# Patient Record
Sex: Female | Born: 1940
Health system: Southern US, Community
[De-identification: ages and names within clinical notes are randomized; demographics above are authoritative.]

## PROBLEM LIST (undated history)

## (undated) DIAGNOSIS — H409 Unspecified glaucoma: Secondary | ICD-10-CM

## (undated) DIAGNOSIS — D649 Anemia, unspecified: Secondary | ICD-10-CM

## (undated) DIAGNOSIS — K219 Gastro-esophageal reflux disease without esophagitis: Secondary | ICD-10-CM

## (undated) DIAGNOSIS — E785 Hyperlipidemia, unspecified: Secondary | ICD-10-CM

## (undated) DIAGNOSIS — K635 Polyp of colon: Secondary | ICD-10-CM

## (undated) DIAGNOSIS — I10 Essential (primary) hypertension: Secondary | ICD-10-CM

## (undated) DIAGNOSIS — K5792 Diverticulitis of intestine, part unspecified, without perforation or abscess without bleeding: Secondary | ICD-10-CM

## (undated) DIAGNOSIS — K5730 Diverticulosis of large intestine without perforation or abscess without bleeding: Secondary | ICD-10-CM

## (undated) DIAGNOSIS — E079 Disorder of thyroid, unspecified: Secondary | ICD-10-CM

## (undated) HISTORY — DX: Diverticulitis of intestine, part unspecified, without perforation or abscess without bleeding: K57.92

## (undated) HISTORY — DX: Polyp of colon: K63.5

## (undated) HISTORY — DX: Unspecified glaucoma: H40.9

## (undated) HISTORY — DX: Disorder of thyroid, unspecified: E07.9

## (undated) HISTORY — PX: CHOLECYSTECTOMY: SHX55

## (undated) HISTORY — DX: Diverticulosis of large intestine without perforation or abscess without bleeding: K57.30

## (undated) HISTORY — PX: TUBAL LIGATION: SHX77

## (undated) HISTORY — DX: Essential (primary) hypertension: I10

## (undated) HISTORY — DX: Anemia, unspecified: D64.9

## (undated) HISTORY — DX: Hyperlipidemia, unspecified: E78.5

## (undated) HISTORY — DX: Gastro-esophageal reflux disease without esophagitis: K21.9

---

## 2003-06-20 ENCOUNTER — Other Ambulatory Visit: Admission: RE | Admit: 2003-06-20 | Discharge: 2003-06-20 | Payer: Self-pay | Admitting: Internal Medicine

## 2004-04-12 ENCOUNTER — Ambulatory Visit (HOSPITAL_COMMUNITY): Admission: RE | Admit: 2004-04-12 | Discharge: 2004-04-12 | Payer: Self-pay | Admitting: Internal Medicine

## 2005-04-18 ENCOUNTER — Ambulatory Visit (HOSPITAL_COMMUNITY): Admission: RE | Admit: 2005-04-18 | Discharge: 2005-04-18 | Payer: Self-pay | Admitting: Internal Medicine

## 2005-05-29 ENCOUNTER — Encounter: Admission: RE | Admit: 2005-05-29 | Discharge: 2005-05-29 | Payer: Self-pay | Admitting: Internal Medicine

## 2006-05-14 ENCOUNTER — Other Ambulatory Visit: Admission: RE | Admit: 2006-05-14 | Discharge: 2006-05-14 | Payer: Self-pay | Admitting: *Deleted

## 2007-02-16 DIAGNOSIS — Z86010 Personal history of colonic polyps: Secondary | ICD-10-CM | POA: Insufficient documentation

## 2007-02-18 DIAGNOSIS — B9681 Helicobacter pylori [H. pylori] as the cause of diseases classified elsewhere: Secondary | ICD-10-CM

## 2007-02-18 HISTORY — DX: Helicobacter pylori (H. pylori) as the cause of diseases classified elsewhere: B96.81

## 2007-03-17 ENCOUNTER — Encounter: Admission: RE | Admit: 2007-03-17 | Discharge: 2007-03-17 | Payer: Self-pay | Admitting: Family Medicine

## 2010-06-22 ENCOUNTER — Other Ambulatory Visit (HOSPITAL_COMMUNITY)
Admission: RE | Admit: 2010-06-22 | Discharge: 2010-06-22 | Disposition: A | Discharge status: Home / Self Care | Payer: Medicare PPO | Source: Ambulatory Visit | Attending: Internal Medicine | Admitting: Internal Medicine

## 2010-06-22 DIAGNOSIS — Z124 Encounter for screening for malignant neoplasm of cervix: Secondary | ICD-10-CM | POA: Insufficient documentation

## 2010-06-25 ENCOUNTER — Other Ambulatory Visit (HOSPITAL_COMMUNITY): Payer: Self-pay | Admitting: Internal Medicine

## 2010-06-25 DIAGNOSIS — Z1231 Encounter for screening mammogram for malignant neoplasm of breast: Secondary | ICD-10-CM

## 2010-07-03 ENCOUNTER — Ambulatory Visit (HOSPITAL_COMMUNITY): Payer: Medicare PPO

## 2010-07-10 ENCOUNTER — Ambulatory Visit (HOSPITAL_COMMUNITY)
Admission: RE | Admit: 2010-07-10 | Discharge: 2010-07-10 | Disposition: A | Discharge status: Home / Self Care | Payer: Medicare PPO | Source: Ambulatory Visit | Attending: Internal Medicine | Admitting: Internal Medicine

## 2010-07-10 DIAGNOSIS — Z1231 Encounter for screening mammogram for malignant neoplasm of breast: Secondary | ICD-10-CM | POA: Insufficient documentation

## 2011-07-02 ENCOUNTER — Other Ambulatory Visit (HOSPITAL_COMMUNITY): Payer: Self-pay | Admitting: Internal Medicine

## 2011-07-02 DIAGNOSIS — E039 Hypothyroidism, unspecified: Secondary | ICD-10-CM

## 2011-07-09 ENCOUNTER — Ambulatory Visit (HOSPITAL_COMMUNITY)
Admission: RE | Admit: 2011-07-09 | Discharge: 2011-07-09 | Disposition: A | Discharge status: Home / Self Care | Payer: Medicare PPO | Source: Ambulatory Visit | Attending: Internal Medicine | Admitting: Internal Medicine

## 2011-07-09 DIAGNOSIS — E039 Hypothyroidism, unspecified: Secondary | ICD-10-CM | POA: Insufficient documentation

## 2011-07-09 DIAGNOSIS — Z13820 Encounter for screening for osteoporosis: Secondary | ICD-10-CM | POA: Insufficient documentation

## 2011-07-09 DIAGNOSIS — Z780 Asymptomatic menopausal state: Secondary | ICD-10-CM | POA: Insufficient documentation

## 2011-09-18 ENCOUNTER — Other Ambulatory Visit (HOSPITAL_COMMUNITY): Payer: Self-pay | Admitting: Physician Assistant

## 2011-09-18 DIAGNOSIS — Z1231 Encounter for screening mammogram for malignant neoplasm of breast: Secondary | ICD-10-CM

## 2011-10-01 ENCOUNTER — Ambulatory Visit (HOSPITAL_COMMUNITY)
Admission: RE | Admit: 2011-10-01 | Discharge: 2011-10-01 | Disposition: A | Discharge status: Home / Self Care | Payer: Medicare PPO | Source: Ambulatory Visit | Attending: Physician Assistant | Admitting: Physician Assistant

## 2011-10-01 DIAGNOSIS — Z1231 Encounter for screening mammogram for malignant neoplasm of breast: Secondary | ICD-10-CM

## 2011-10-01 LAB — HM MAMMOGRAPHY: HM Mammogram: NORMAL

## 2012-09-23 LAB — HM COLONOSCOPY

## 2012-12-14 ENCOUNTER — Telehealth: Payer: Self-pay | Admitting: *Deleted

## 2012-12-14 NOTE — Telephone Encounter (Signed)
Please tell her to switch to allegra, let her know she can hold her nose while drinking water. Would prefer not to give a nasal spray with glacoma history.

## 2012-12-14 NOTE — Telephone Encounter (Signed)
Patient aware of medical advice per Marchelle Folks

## 2012-12-14 NOTE — Telephone Encounter (Signed)
Pt is calling with clogged ear,says it could be due to sinus but wasn't sure what to try OTC to help this . Did start on clartin this pm but is wondering if u have anymore suggestions? Please advise*

## 2012-12-21 ENCOUNTER — Encounter: Payer: Self-pay | Admitting: Internal Medicine

## 2012-12-21 DIAGNOSIS — E785 Hyperlipidemia, unspecified: Secondary | ICD-10-CM | POA: Insufficient documentation

## 2012-12-21 DIAGNOSIS — I10 Essential (primary) hypertension: Secondary | ICD-10-CM | POA: Insufficient documentation

## 2012-12-21 DIAGNOSIS — K219 Gastro-esophageal reflux disease without esophagitis: Secondary | ICD-10-CM | POA: Insufficient documentation

## 2012-12-22 ENCOUNTER — Encounter: Payer: Self-pay | Admitting: Physician Assistant

## 2012-12-22 ENCOUNTER — Ambulatory Visit (INDEPENDENT_AMBULATORY_CARE_PROVIDER_SITE_OTHER): Payer: Medicare PPO | Admitting: Physician Assistant

## 2012-12-22 VITALS — BP 128/68 | HR 72 | Temp 97.7°F | Resp 16 | Ht 64.5 in | Wt 144.0 lb

## 2012-12-22 DIAGNOSIS — J069 Acute upper respiratory infection, unspecified: Secondary | ICD-10-CM

## 2012-12-22 MED ORDER — PREDNISONE 20 MG PO TABS: ORAL_TABLET | ORAL | Status: DC

## 2012-12-22 MED ORDER — AMOXICILLIN-POT CLAVULANATE 875-125 MG PO TABS: 1.0000 | ORAL_TABLET | Freq: Two times a day (BID) | ORAL | Status: DC

## 2012-12-22 NOTE — Progress Notes (Signed)
   Subjective:    Patient ID: Robin Zimmerman, female    DOB: [birth year], 72 y.o.   MRN: [id number]  Sinus Problem This is a new problem. The current episode started 1 to 4 weeks ago. The problem has been gradually worsening since onset. There has been no fever. She is experiencing no pain. Associated symptoms include congestion, coughing, ear pain, headaches, a hoarse voice and sinus pressure. Pertinent negatives include no chills, diaphoresis, neck pain, shortness of breath, sneezing, sore throat or swollen glands. Past treatments include nothing.     Current Outpatient Prescriptions on File Prior to Visit  Medication Sig Dispense Refill  . Cholecalciferol (VITAMIN D PO) Take 5,000 Int'l Units by mouth daily.      . IRON PO Take by mouth daily.      Marland Kitchen levobunolol (BETAGAN) 0.5 % ophthalmic solution at bedtime.      . Travoprost, BAK Free, (TRAVATAN Z) 0.004 % SOLN ophthalmic solution 1 drop at bedtime.       No current facility-administered medications on file prior to visit.   Past Medical History  Diagnosis Date  . Anemia   . GERD (gastroesophageal reflux disease)   . Hyperlipidemia   . Hypertension   . Thyroid disease    Review of Systems  Constitutional: Negative for chills and diaphoresis.  HENT: Positive for congestion, ear pain, hoarse voice and sinus pressure. Negative for sneezing and sore throat.   Respiratory: Positive for cough. Negative for shortness of breath.   Musculoskeletal: Negative for neck pain.  Neurological: Positive for headaches.       Objective:   Physical Exam  Constitutional: She appears well-developed and well-nourished.  HENT:  Head: Normocephalic and atraumatic.  Right Ear: External ear normal. Tympanic membrane is bulging. A middle ear effusion is present.  Left Ear: Tympanic membrane and external ear normal.  Nose: Right sinus exhibits frontal sinus tenderness. Left sinus exhibits frontal sinus tenderness.  Mouth/Throat: Oropharynx is  clear and moist.  Eyes: Conjunctivae and EOM are normal.  Neck: Normal range of motion. Neck supple.  Cardiovascular: Normal rate, regular rhythm, normal heart sounds and intact distal pulses.   Pulmonary/Chest: Effort normal and breath sounds normal. No respiratory distress. She has no wheezes.  Abdominal: Soft. Bowel sounds are normal.  Lymphadenopathy:    She has no cervical adenopathy.  Skin: Skin is warm and dry.      Assessment & Plan:  Upper respiratory infection - Plan: amoxicillin-clavulanate (AUGMENTIN) 875-125 MG per tablet, predniSONE (DELTASONE) 20 MG tablet

## 2012-12-22 NOTE — Patient Instructions (Signed)
Please get on the augmentin for at least 7 days as directed Please do claritin or zyrtec over the counter with a decongestant.  Serous Otitis Media  Serous otitis media is fluid in the middle ear space. This space contains the bones for hearing and air. Air in the middle ear space helps to transmit sound.  The air gets there through the eustachian tube. This tube goes from the back of the nose (nasopharynx) to the middle ear space. It keeps the pressure in the middle ear the same as the outside world. It also helps to drain fluid from the middle ear space. CAUSES  Serous otitis media occurs when the eustachian tube gets blocked. Blockage can come from:  Ear infections.  Colds and other upper respiratory infections.  Allergies.  Irritants such as cigarette smoke.  Sudden changes in air pressure (such as descending in an airplane).  Enlarged adenoids.  A mass in the nasopharynx. During colds and upper respiratory infections, the middle ear space can become temporarily filled with fluid. This can happen after an ear infection also. Once the infection clears, the fluid will generally drain out of the ear through the eustachian tube. If it does not, then serous otitis media occurs. SIGNS AND SYMPTOMS   Hearing loss.  A feeling of fullness in the ear, without pain.  Young children may not show any symptoms but may show slight behavioral changes, such as agitation, ear pulling, or crying. DIAGNOSIS  Serous otitis media is diagnosed by an ear exam. Tests may be done to check on the movement of the eardrum. Hearing exams may also be done. TREATMENT  The fluid most often goes away without treatment. If allergy is the cause, allergy treatment may be helpful. Fluid that persists for several months may require minor surgery. A small tube is placed in the eardrum to:  Drain the fluid.  Restore the air in the middle ear space. In certain situations, antibiotics are used to avoid surgery.  Surgery may be done to remove enlarged adenoids (if this is the cause). HOME CARE INSTRUCTIONS   Keep children away from tobacco smoke.  Be sure to keep any follow-up appointments. SEEK MEDICAL CARE IF:   Your hearing is not better in 3 months.  Your hearing is worse.  You have ear pain.  You have drainage from the ear.  You have dizziness.  You have serous otitis media only in one ear or have any bleeding from your nose (epistaxis).  You notice a lump on your neck. MAKE SURE YOU:  Understand these instructions.   Will watch your condition.   Will get help right away if you are not doing well or get worse.  Document Released: 03/23/2003 Document Revised: 09/02/2012 Document Reviewed: 07/28/2012 [facility] Patient Information 2014 [place], [place].

## 2013-01-19 ENCOUNTER — Encounter: Payer: Self-pay | Admitting: Physician Assistant

## 2013-01-19 ENCOUNTER — Ambulatory Visit (INDEPENDENT_AMBULATORY_CARE_PROVIDER_SITE_OTHER): Payer: Medicare PPO | Admitting: Physician Assistant

## 2013-01-19 VITALS — BP 118/72 | HR 76 | Temp 97.9°F | Resp 16 | Ht 64.5 in | Wt 143.0 lb

## 2013-01-19 DIAGNOSIS — E785 Hyperlipidemia, unspecified: Secondary | ICD-10-CM

## 2013-01-19 DIAGNOSIS — R7303 Prediabetes: Secondary | ICD-10-CM

## 2013-01-19 DIAGNOSIS — E559 Vitamin D deficiency, unspecified: Secondary | ICD-10-CM

## 2013-01-19 DIAGNOSIS — Z79899 Other long term (current) drug therapy: Secondary | ICD-10-CM

## 2013-01-19 DIAGNOSIS — E079 Disorder of thyroid, unspecified: Secondary | ICD-10-CM

## 2013-01-19 DIAGNOSIS — R7309 Other abnormal glucose: Secondary | ICD-10-CM | POA: Insufficient documentation

## 2013-01-19 DIAGNOSIS — I10 Essential (primary) hypertension: Secondary | ICD-10-CM

## 2013-01-19 LAB — CBC WITH DIFFERENTIAL/PLATELET
Basophils Absolute: 0 10*3/uL (ref 0.0–0.1)
Basophils Relative: 0 % (ref 0–1)
EOS ABS: 0.1 10*3/uL (ref 0.0–0.7)
EOS PCT: 2 % (ref 0–5)
HCT: 35.8 % — ABNORMAL LOW (ref 36.0–46.0)
Hemoglobin: 11.8 g/dL — ABNORMAL LOW (ref 12.0–15.0)
Lymphocytes Relative: 37 % (ref 12–46)
Lymphs Abs: 1.2 10*3/uL (ref 0.7–4.0)
MCH: 29 pg (ref 26.0–34.0)
MCHC: 33 g/dL (ref 30.0–36.0)
MCV: 88 fL (ref 78.0–100.0)
MONO ABS: 0.2 10*3/uL (ref 0.1–1.0)
MONOS PCT: 6 % (ref 3–12)
NEUTROS ABS: 1.8 10*3/uL (ref 1.7–7.7)
Neutrophils Relative %: 55 % (ref 43–77)
Platelets: 265 10*3/uL (ref 150–400)
RBC: 4.07 MIL/uL (ref 3.87–5.11)
RDW: 13.4 % (ref 11.5–15.5)
WBC: 3.3 10*3/uL — ABNORMAL LOW (ref 4.0–10.5)

## 2013-01-19 LAB — BASIC METABOLIC PANEL WITH GFR
BUN: 11 mg/dL (ref 6–23)
CALCIUM: 9.1 mg/dL (ref 8.4–10.5)
CO2: 34 meq/L — AB (ref 19–32)
CREATININE: 1.02 mg/dL (ref 0.50–1.10)
Chloride: 101 mEq/L (ref 96–112)
GFR, EST AFRICAN AMERICAN: 64 mL/min
GFR, EST NON AFRICAN AMERICAN: 55 mL/min — AB
GLUCOSE: 73 mg/dL (ref 70–99)
Potassium: 4 mEq/L (ref 3.5–5.3)
Sodium: 142 mEq/L (ref 135–145)

## 2013-01-19 LAB — HEMOGLOBIN A1C
HEMOGLOBIN A1C: 6 % — AB (ref <5.7)
MEAN PLASMA GLUCOSE: 126 mg/dL — AB (ref <117)

## 2013-01-19 LAB — HEPATIC FUNCTION PANEL
ALBUMIN: 4.2 g/dL (ref 3.5–5.2)
ALK PHOS: 65 U/L (ref 39–117)
ALT: 9 U/L (ref 0–35)
AST: 16 U/L (ref 0–37)
BILIRUBIN DIRECT: 0.1 mg/dL (ref 0.0–0.3)
Indirect Bilirubin: 0.4 mg/dL (ref 0.0–0.9)
Total Bilirubin: 0.5 mg/dL (ref 0.3–1.2)
Total Protein: 7.1 g/dL (ref 6.0–8.3)

## 2013-01-19 LAB — LIPID PANEL
Cholesterol: 204 mg/dL — ABNORMAL HIGH (ref 0–200)
HDL: 74 mg/dL (ref >39)
LDL CALC: 109 mg/dL — AB (ref 0–99)
TRIGLYCERIDES: 103 mg/dL (ref <150)
Total CHOL/HDL Ratio: 2.8 Ratio
VLDL: 21 mg/dL (ref 0–40)

## 2013-01-19 LAB — TSH: TSH: 0.437 u[IU]/mL (ref 0.350–4.500)

## 2013-01-19 LAB — MAGNESIUM: Magnesium: 2.1 mg/dL (ref 1.5–2.5)

## 2013-01-19 NOTE — Patient Instructions (Addendum)
Bad carbs also include fruit juice, alcohol, and sweet tea. These are empty calories that do not signal to your brain that you are full.   Please remember the good carbs are still carbs which convert into sugar. So please measure them out no more than 1/2-1 cup of rice, oatmeal, pasta, and beans.  Veggies are however free foods! Pile them on.   I like lean protein at every meal such as chicken, [place], pork chops, cottage cheese, etc. Just do not fry these meats and please center your meal around vegetable, the meats should be a side dish.   No all fruit is created equal. Please see the list below, the fruit at the bottom is higher in sugars than the fruit at the top   Serous Otitis Media  Serous otitis media is fluid in the middle ear space. This space contains the bones for hearing and air. Air in the middle ear space helps to transmit sound.  The air gets there through the eustachian tube. This tube goes from the back of the nose (nasopharynx) to the middle ear space. It keeps the pressure in the middle ear the same as the outside world. It also helps to drain fluid from the middle ear space. CAUSES  Serous otitis media occurs when the eustachian tube gets blocked. Blockage can come from:  Ear infections.  Colds and other upper respiratory infections.  Allergies.  Irritants such as cigarette smoke.  Sudden changes in air pressure (such as descending in an airplane).  Enlarged adenoids.  A mass in the nasopharynx. During colds and upper respiratory infections, the middle ear space can become temporarily filled with fluid. This can happen after an ear infection also. Once the infection clears, the fluid will generally drain out of the ear through the eustachian tube. If it does not, then serous otitis media occurs. SIGNS AND SYMPTOMS   Hearing loss.  A feeling of fullness in the ear, without pain.  Young children may not show any symptoms but may show slight behavioral  changes, such as agitation, ear pulling, or crying. DIAGNOSIS  Serous otitis media is diagnosed by an ear exam. Tests may be done to check on the movement of the eardrum. Hearing exams may also be done. TREATMENT  The fluid most often goes away without treatment. If allergy is the cause, allergy treatment may be helpful. Fluid that persists for several months may require minor surgery. A small tube is placed in the eardrum to:  Drain the fluid.  Restore the air in the middle ear space. In certain situations, antibiotics are used to avoid surgery. Surgery may be done to remove enlarged adenoids (if this is the cause). HOME CARE INSTRUCTIONS   Keep children away from tobacco smoke.  Be sure to keep any follow-up appointments. SEEK MEDICAL CARE IF:   Your hearing is not better in 3 months.  Your hearing is worse.  You have ear pain.  You have drainage from the ear.  You have dizziness.  You have serous otitis media only in one ear or have any bleeding from your nose (epistaxis).  You notice a lump on your neck. MAKE SURE YOU:  Understand these instructions.   Will watch your condition.   Will get help right away if you are not doing well or get worse.  Document Released: 03/23/2003 Document Revised: 09/02/2012 Document Reviewed: 07/28/2012 [facility] Patient Information 2014 [place], [place]. Constipation, Adult Constipation is when a person has fewer than 3 bowel movements  a week; has difficulty having a bowel movement; or has stools that are dry, hard, or larger than normal. As people grow older, constipation is more common. If you try to fix constipation with medicines that make you have a bowel movement (laxatives), the problem may get worse. Long-term laxative use may cause the muscles of the colon to become weak. A low-fiber diet, not taking in enough fluids, and taking certain medicines may make constipation worse. CAUSES   Certain medicines, such as antidepressants,  pain medicine, iron supplements, antacids, and water pills.   Certain diseases, such as diabetes, irritable bowel syndrome (IBS), thyroid disease, or depression.   Not drinking enough water.   Not eating enough fiber-rich foods.   Stress or travel.  Lack of physical activity or exercise.  Not going to the restroom when there is the urge to have a bowel movement.  Ignoring the urge to have a bowel movement.  Using laxatives too much. SYMPTOMS   Having fewer than 3 bowel movements a week.   Straining to have a bowel movement.   Having hard, dry, or larger than normal stools.   Feeling full or bloated.   Pain in the lower abdomen.  Not feeling relief after having a bowel movement. DIAGNOSIS  Your caregiver will take a medical history and perform a physical exam. Further testing may be done for severe constipation. Some tests may include:   A barium enema X-ray to examine your rectum, colon, and sometimes, your small intestine.  A sigmoidoscopy to examine your lower colon.  A colonoscopy to examine your entire colon. TREATMENT  Treatment will depend on the severity of your constipation and what is causing it. Some dietary treatments include drinking more fluids and eating more fiber-rich foods. Lifestyle treatments may include regular exercise. If these diet and lifestyle recommendations do not help, your caregiver may recommend taking over-the-counter laxative medicines to help you have bowel movements. Prescription medicines may be prescribed if over-the-counter medicines do not work.  HOME CARE INSTRUCTIONS   Increase dietary fiber in your diet, such as fruits, vegetables, whole grains, and beans. Limit high-fat and processed sugars in your diet, such as [place] fries, hamburgers, cookies, candies, and soda.   A fiber supplement may be added to your diet if you cannot get enough fiber from foods.   Drink enough fluids to keep your urine clear or pale yellow.    Exercise regularly or as directed by your caregiver.   Go to the restroom when you have the urge to go. Do not hold it.  Only take medicines as directed by your caregiver. Do not take other medicines for constipation without talking to your caregiver first. [place] IF:   You have bright red blood in your stool.   Your constipation lasts for more than 4 days or gets worse.   You have abdominal or rectal pain.   You have thin, pencil-like stools.  You have unexplained weight loss. MAKE SURE YOU:   Understand these instructions.  Will watch your condition.  Will get help right away if you are not doing well or get worse. Document Released: 09/29/2003 Document Revised: 03/25/2011 Document Reviewed: 12/04/2010 [facility] Patient Information 2014 [place], [place].

## 2013-01-19 NOTE — Progress Notes (Signed)
HPI Patient presents for 3 month follow up with hypertension, hyperlipidemia, prediabetes and vitamin D. Patient's blood pressure has been controlled at home, today their BP is BP: 118/72 mmHg  Patient denies chest pain, shortness of breath, dizziness.  Patient's cholesterol is diet controlled. The cholesterol last visit was LDL was 114(93). The patient has been working on diet and exercise for prediabetes, and denies changes in vision, polys, and paresthesias. A1C 5.9 (5.6) Patient is on Vitamin D supplement, last vitamin D was 55. Last H/H was 11.5/34.3.  Patient was here in Dec for middle ear effusion and states that had improved with augmentin and prednisone. She is still on Claritin.   Current Medications:  Current Outpatient Prescriptions on File Prior to Visit  Medication Sig Dispense Refill  . Calcium-Vitamin D-Vitamin K (CALCIUM SOFT CHEWS PO) Take by mouth daily.      . Cholecalciferol (VITAMIN D PO) Take 5,000 Int'l Units by mouth daily.      . IRON PO Take by mouth daily.      Marland Kitchen levobunolol (BETAGAN) 0.5 % ophthalmic solution at bedtime.      . Travoprost, BAK Free, (TRAVATAN Z) 0.004 % SOLN ophthalmic solution 1 drop at bedtime.       No current facility-administered medications on file prior to visit.   Medical History:  Past Medical History  Diagnosis Date  . Anemia   . GERD (gastroesophageal reflux disease)   . Hyperlipidemia   . Hypertension   . Thyroid disease    Allergies: No Known Allergies  ROS Constitutional: Denies fever, chills, headaches, insomnia, fatigue, night sweats Eyes: Denies redness, blurred vision, diplopia, discharge, itchy, watery eyes.  ENT: Denies congestion, post nasal drip, sore throat, earache, dental pain, Tinnitus, Vertigo, Sinus pain, snoring.  Cardio: Denies chest pain, palpitations, irregular heartbeat, dyspnea, diaphoresis, orthopnea, PND, claudication, edema Respiratory: denies cough, shortness of breath, wheezing.  Gastrointestinal:  Denies dysphagia, heartburn, AB pain/ cramps, N/V, diarrhea, constipation, hematemesis, melena, hematochezia,  hemorrhoids Genitourinary: Denies dysuria, frequency, urgency, nocturia, hesitancy, discharge, hematuria, flank pain Musculoskeletal: Denies myalgia, stiffness, pain, swelling and strain/sprain. Skin: Denies pruritis, rash, changing in skin lesion Neuro: Denies Weakness, tremor, incoordination, spasms, pain Psychiatric: Denies confusion, memory loss, sensory loss Endocrine: Denies change in weight, skin, hair change, nocturia Diabetic Polys, Denies visual blurring, hyper /hypo glycemic episodes, and paresthesia, Heme/Lymph: Denies Excessive bleeding, bruising, enlarged lymph nodes  Family history- Review and unchanged Social history- Review and unchanged Physical Exam: Filed Vitals:   01/19/13 0912  BP: 118/72  Pulse: 76  Temp: 97.9 F (36.6 C)  Resp: 16   Filed Weights   01/19/13 0912  Weight: 143 lb (64.864 kg)   General Appearance: Well nourished, in no apparent distress. Eyes: PERRLA, EOMs, conjunctiva no swelling or erythema Sinuses: No Frontal/maxillary tenderness ENT/Mouth: Ext aud canals clear, TMs without erythema, bulging. No erythema, swelling, or exudate on post pharynx.  Tonsils not swollen or erythematous. Hearing normal.  Neck: Supple, thyroid normal.  Respiratory: Respiratory effort normal, BS equal bilaterally without rales, rhonchi, wheezing or stridor.  Cardio: RRR with no MRGs. Brisk peripheral pulses without edema.  Abdomen: Soft, + BS.  Non tender, no guarding, rebound, hernias, masses. Lymphatics: Non tender without lymphadenopathy.  Musculoskeletal: Full ROM, 5/5 strength, normal gait.  Skin: Warm, dry without rashes, lesions, ecchymosis.  Neuro: Cranial nerves intact. Normal muscle tone, no cerebellar symptoms. Sensation intact.  Psych: Awake and oriented X 3, normal affect, Insight and Judgment appropriate.   Assessment and Plan:  Hypertension: Continue medication, monitor blood pressure at home.  Continue DASH diet. Cholesterol: Continue diet and exercise. Check cholesterol.  Pre-diabetes-Continue diet and exercise. Check A1C Vitamin D Def- check level and continue medications.   Continue diet and meds as discussed. Further disposition pending results of labs.  Vicie Mutters 9:20 AM

## 2013-01-20 LAB — VITAMIN D 25 HYDROXY (VIT D DEFICIENCY, FRACTURES): VIT D 25 HYDROXY: 66 ng/mL (ref 30–89)

## 2013-01-20 LAB — INSULIN, FASTING: INSULIN FASTING, SERUM: 37 u[IU]/mL — AB (ref 3–28)

## 2013-04-04 DIAGNOSIS — Z79899 Other long term (current) drug therapy: Secondary | ICD-10-CM | POA: Insufficient documentation

## 2013-04-04 DIAGNOSIS — E559 Vitamin D deficiency, unspecified: Secondary | ICD-10-CM | POA: Insufficient documentation

## 2013-04-04 NOTE — Progress Notes (Signed)
Patient ID: Robin Zimmerman, female   DOB: 1940/12/09, 73 y.o.   MRN: [id number]   error

## 2013-04-05 ENCOUNTER — Encounter: Payer: Commercial Managed Care - HMO | Admitting: Internal Medicine

## 2013-04-05 ENCOUNTER — Encounter: Payer: Self-pay | Admitting: Internal Medicine

## 2013-07-06 ENCOUNTER — Encounter: Payer: Self-pay | Admitting: Physician Assistant

## 2013-07-06 ENCOUNTER — Ambulatory Visit (INDEPENDENT_AMBULATORY_CARE_PROVIDER_SITE_OTHER): Payer: Commercial Managed Care - HMO | Admitting: Physician Assistant

## 2013-07-06 ENCOUNTER — Encounter: Payer: Self-pay | Admitting: Internal Medicine

## 2013-07-06 VITALS — BP 120/78 | HR 68 | Temp 98.5°F | Resp 16 | Ht 64.5 in | Wt 146.0 lb

## 2013-07-06 DIAGNOSIS — R7303 Prediabetes: Secondary | ICD-10-CM

## 2013-07-06 DIAGNOSIS — Z1331 Encounter for screening for depression: Secondary | ICD-10-CM

## 2013-07-06 DIAGNOSIS — I10 Essential (primary) hypertension: Secondary | ICD-10-CM

## 2013-07-06 DIAGNOSIS — Z789 Other specified health status: Secondary | ICD-10-CM

## 2013-07-06 DIAGNOSIS — Z79899 Other long term (current) drug therapy: Secondary | ICD-10-CM

## 2013-07-06 DIAGNOSIS — R7309 Other abnormal glucose: Secondary | ICD-10-CM

## 2013-07-06 DIAGNOSIS — E538 Deficiency of other specified B group vitamins: Secondary | ICD-10-CM

## 2013-07-06 DIAGNOSIS — D649 Anemia, unspecified: Secondary | ICD-10-CM

## 2013-07-06 DIAGNOSIS — N3000 Acute cystitis without hematuria: Secondary | ICD-10-CM

## 2013-07-06 DIAGNOSIS — Z0000 Encounter for general adult medical examination without abnormal findings: Secondary | ICD-10-CM

## 2013-07-06 DIAGNOSIS — E785 Hyperlipidemia, unspecified: Secondary | ICD-10-CM

## 2013-07-06 DIAGNOSIS — E079 Disorder of thyroid, unspecified: Secondary | ICD-10-CM

## 2013-07-06 DIAGNOSIS — E559 Vitamin D deficiency, unspecified: Secondary | ICD-10-CM

## 2013-07-06 LAB — CBC WITH DIFFERENTIAL/PLATELET
Basophils Absolute: 0 10*3/uL (ref 0.0–0.1)
Basophils Relative: 0 % (ref 0–1)
EOS ABS: 0 10*3/uL (ref 0.0–0.7)
EOS PCT: 1 % (ref 0–5)
HEMATOCRIT: 33.8 % — AB (ref 36.0–46.0)
Hemoglobin: 11.4 g/dL — ABNORMAL LOW (ref 12.0–15.0)
LYMPHS PCT: 49 % — AB (ref 12–46)
Lymphs Abs: 1.8 10*3/uL (ref 0.7–4.0)
MCH: 29.3 pg (ref 26.0–34.0)
MCHC: 33.7 g/dL (ref 30.0–36.0)
MCV: 86.9 fL (ref 78.0–100.0)
MONO ABS: 0.2 10*3/uL (ref 0.1–1.0)
Monocytes Relative: 5 % (ref 3–12)
Neutro Abs: 1.6 10*3/uL — ABNORMAL LOW (ref 1.7–7.7)
Neutrophils Relative %: 45 % (ref 43–77)
PLATELETS: 259 10*3/uL (ref 150–400)
RBC: 3.89 MIL/uL (ref 3.87–5.11)
RDW: 13.4 % (ref 11.5–15.5)
WBC: 3.6 10*3/uL — AB (ref 4.0–10.5)

## 2013-07-06 NOTE — Patient Instructions (Addendum)
Call [facility] imaging for Mammogram- # [phone]- check to see when your last MGM was and need new one  Preventative Care for Adults - Female      MAINTAIN REGULAR HEALTH EXAMS:  A routine yearly physical is a good way to check in with your primary care provider about your health and preventive screening. It is also an opportunity to share updates about your health and any concerns you have, and receive a thorough all-over exam.   Most health insurance companies pay for at least some preventative services.  Check with your health plan for specific coverages.  WHAT PREVENTATIVE SERVICES DO WOMEN NEED?  Adult women should have their weight and blood pressure checked regularly.   Women age 48 and older should have their cholesterol levels checked regularly.  Women should be screened for cervical cancer with a Pap smear and pelvic exam beginning at either age 71, or 3 years after they become sexually activity.    Breast cancer screening generally begins at age 29 with a mammogram and breast exam by your primary care provider.    Beginning at age 62 and continuing to age 2, women should be screened for colorectal cancer.  Certain people may need continued testing until age 31.  Updating vaccinations is part of preventative care.  Vaccinations help protect against diseases such as the flu.  Osteoporosis is a disease in which the bones lose minerals and strength as we age. Women ages 76 and over should discuss this with their caregivers, as should women after menopause who have other risk factors.  Lab tests are generally done as part of preventative care to screen for anemia and blood disorders, to screen for problems with the kidneys and liver, to screen for bladder problems, to check blood sugar, and to check your cholesterol level.  Preventative services generally include counseling about diet, exercise, avoiding tobacco, drugs, excessive alcohol consumption, and sexually transmitted  infections.    GENERAL RECOMMENDATIONS FOR GOOD HEALTH:  Healthy diet:  Eat a variety of foods, including fruit, vegetables, animal or vegetable protein, such as meat, fish, chicken, and eggs, or beans, lentils, tofu, and grains, such as rice.  Drink plenty of water daily.  Decrease saturated fat in the diet, avoid lots of red meat, processed foods, sweets, fast foods, and fried foods.  Exercise:  Aerobic exercise helps maintain good heart health. At least 30-40 minutes of moderate-intensity exercise is recommended. For example, a brisk walk that increases your heart rate and breathing. This should be done on most days of the week.   Find a type of exercise or a variety of exercises that you enjoy so that it becomes a part of your daily life.  Examples are running, walking, swimming, water aerobics, and biking.  For motivation and support, explore group exercise such as aerobic class, spin class, Zumba, Yoga,or  martial arts, etc.    Set exercise goals for yourself, such as a certain weight goal, walk or run in a race such as a 5k walk/run.  Speak to your primary care provider about exercise goals.  Disease prevention:  If you smoke or chew tobacco, find out from your caregiver how to quit. It can literally save your life, no matter how long you have been a tobacco user. If you do not use tobacco, never begin.   Maintain a healthy diet and normal weight. Increased weight leads to problems with blood pressure and diabetes.   The Body Mass Index or BMI is  a way of measuring how much of your body is fat. Having a BMI above 27 increases the risk of heart disease, diabetes, hypertension, stroke and other problems related to obesity. Your caregiver can help determine your BMI and based on it develop an exercise and dietary program to help you achieve or maintain this important measurement at a healthful level.  High blood pressure causes heart and blood vessel problems.  Persistent high blood  pressure should be treated with medicine if weight loss and exercise do not work.   Fat and cholesterol leaves deposits in your arteries that can block them. This causes heart disease and vessel disease elsewhere in your body.  If your cholesterol is found to be high, or if you have heart disease or certain other medical conditions, then you may need to have your cholesterol monitored frequently and be treated with medication.   Ask if you should have a cardiac stress test if your history suggests this. A stress test is a test done on a treadmill that looks for heart disease. This test can find disease prior to there being a problem.  Menopause can be associated with physical symptoms and risks. Hormone replacement therapy is available to decrease these. You should talk to your caregiver about whether starting or continuing to take hormones is right for you.   Osteoporosis is a disease in which the bones lose minerals and strength as we age. This can result in serious bone fractures. Risk of osteoporosis can be identified using a bone density scan. Women ages 16 and over should discuss this with their caregivers, as should women after menopause who have other risk factors. Ask your caregiver whether you should be taking a calcium supplement and Vitamin D, to reduce the rate of osteoporosis.   Avoid drinking alcohol in excess (more than two drinks per day).  Avoid use of street drugs. Do not share needles with anyone. Ask for professional help if you need assistance or instructions on stopping the use of alcohol, cigarettes, and/or drugs.  Brush your teeth twice a day with fluoride toothpaste, and floss once a day. Good oral hygiene prevents tooth decay and gum disease. The problems can be painful, unattractive, and can cause other health problems. Visit your dentist for a routine oral and dental check up and preventive care every 6-12 months.   Look at your skin regularly.  Use a mirror to look at your  back. Notify your caregivers of changes in moles, especially if there are changes in shapes, colors, a size larger than a pencil eraser, an irregular border, or development of new moles.  Safety:  Use seatbelts 100% of the time, whether driving or as a passenger.  Use safety devices such as hearing protection if you work in environments with loud noise or significant background noise.  Use safety glasses when doing any work that could send debris in to the eyes.  Use a helmet if you ride a bike or motorcycle.  Use appropriate safety gear for contact sports.  Talk to your caregiver about gun safety.  Use sunscreen with a SPF (or skin protection factor) of 15 or greater.  Lighter skinned people are at a greater risk of skin cancer. Don't forget to also wear sunglasses in order to protect your eyes from too much damaging sunlight. Damaging sunlight can accelerate cataract formation.   Practice safe sex. Use condoms. Condoms are used for birth control and to help reduce the spread of sexually transmitted infections (or STIs).  Some of the STIs are gonorrhea (the clap), chlamydia, syphilis, trichomonas, herpes, HPV (human papilloma virus) and HIV (human immunodeficiency virus) which causes AIDS. The herpes, HIV and HPV are viral illnesses that have no cure. These can result in disability, cancer and death.   Keep carbon monoxide and smoke detectors in your home functioning at all times. Change the batteries every 6 months or use a model that plugs into the wall.   Vaccinations:  Stay up to date with your tetanus shots and other required immunizations. You should have a booster for tetanus every 10 years. Be sure to get your flu shot every year, since 5%-20% of the [place] population comes down with the flu. The flu vaccine changes each year, so being vaccinated once is not enough. Get your shot in the fall, before the flu season peaks.   Other vaccines to consider:  Human Papilloma Virus or HPV causes  cancer of the cervix, and other infections that can be transmitted from person to person. There is a vaccine for HPV, and females should get immunized between the ages of 57 and 70. It requires a series of 3 shots.   Pneumococcal vaccine to protect against certain types of pneumonia.  This is normally recommended for adults age 34 or older.  However, adults younger than 73 years old with certain underlying conditions such as diabetes, heart or lung disease should also receive the vaccine.  Shingles vaccine to protect against Varicella Zoster if you are older than age 47, or younger than 73 years old with certain underlying illness.  Hepatitis A vaccine to protect against a form of infection of the liver by a virus acquired from food.  Hepatitis B vaccine to protect against a form of infection of the liver by a virus acquired from blood or body fluids, particularly if you work in health care.  If you plan to travel internationally, check with your local health department for specific vaccination recommendations.  Cancer Screening:  Breast cancer screening is essential to preventive care for women. All women age 43 and older should perform a breast self-exam every month. At age 59 and older, women should have their caregiver complete a breast exam each year. Women at ages 57 and older should have a mammogram (x-ray film) of the breasts. Your caregiver can discuss how often you need mammograms.    Cervical cancer screening includes taking a Pap smear (sample of cells examined under a microscope) from the cervix (end of the uterus). It also includes testing for HPV (Human Papilloma Virus, which can cause cervical cancer). Screening and a pelvic exam should begin at age 80, or 3 years after a woman becomes sexually active. Screening should occur every year, with a Pap smear but no HPV testing, up to age 24. After age 93, you should have a Pap smear every 3 years with HPV testing, if no HPV was found  previously.   Most routine colon cancer screening begins at the age of 25. On a yearly basis, doctors may provide special easy to use take-home tests to check for hidden blood in the stool. Sigmoidoscopy or colonoscopy can detect the earliest forms of colon cancer and is life saving. These tests use a small camera at the end of a tube to directly examine the colon. Speak to your caregiver about this at age 85, when routine screening begins (and is repeated every 5 years unless early forms of pre-cancerous polyps or small growths are found).    Hemorrhoids  Hemorrhoids are puffy (swollen) veins around the rectum or anus. Hemorrhoids can cause pain, itching, bleeding, or irritation. HOME CARE  Eat foods with fiber, such as whole grains, beans, nuts, fruits, and vegetables. Ask your doctor about taking products with added fiber in them (fibersupplements).  Drink enough fluid to keep your pee (urine) clear or pale yellow.  Exercise often.  Go to the bathroom when you have the urge to poop. Do not wait.  Avoid straining to poop (bowel movement).  Keep the butt area dry and clean. Use wet toilet paper or moist paper towels.  Medicated creams and medicine inserted into the anus (anal suppository) may be used or applied as told.  Only take medicine as told by your doctor.  Take a warm water bath (sitz bath) for 15-20 minutes to ease pain. Do this 3-4 times a day.  Place ice packs on the area if it is tender or puffy. Use the ice packs between the warm water baths.  Put ice in a plastic bag.  Place a towel between your skin and the bag.  Leave the ice on for 15-20 minutes, 03-04 times a day.  Do not use a donut-shaped pillow or sit on the toilet for a long time. GET HELP RIGHT AWAY IF:   You have more pain that is not controlled by treatment or medicine.  You have bleeding that will not stop.  You have trouble or are unable to poop (bowel movement).  You have pain or puffiness  outside the area of the hemorrhoids. MAKE SURE YOU:   Understand these instructions.  Will watch your condition.  Will get help right away if you are not doing well or get worse. Document Released: 10/10/2007 Document Revised: 12/18/2011 Document Reviewed: 11/12/2011 [facility] Patient Information 2015 [place], [place]. This information is not intended to replace advice given to you by your health care provider. Make sure you discuss any questions you have with your health care provider.

## 2013-07-06 NOTE — Progress Notes (Signed)
MEDICARE ANNUAL WELLNESS VISIT AND CPE  Assessment:   1. Essential hypertension - CBC with Differential - BASIC METABOLIC PANEL WITH GFR - Hepatic function panel - Urinalysis, Routine w reflex microscopic - Microalbumin / creatinine urine ratio - EKG 12-Lead - [place], RETROPERITNL ABD,  LTD  2. Prediabetes Discussed general issues about diabetes pathophysiology and management., Educational material distributed., Suggested low cholesterol diet., Encouraged aerobic exercise., Discussed foot care., Reminded to get yearly retinal exam. - Hemoglobin A1c - Insulin, fasting - HM DIABETES FOOT EXAM  3. Thyroid disease - TSH  4. Anemia, unspecified anemia type - Iron and TIBC  5. Encounter for long-term (current) use of other medications - Magnesium  6. Hyperlipidemia - Lipid panel  7. Vitamin D Deficiency - Vit D  25 hydroxy (rtn osteoporosis monitoring)  8. Acute cystitis without hematuria Some frequency - Urine culture  9. Some short term memory issues - declines depression but there may be an element of that, will also check urine, B12, and labs  10. EKG with sinus bradycardia - will monitor.    Plan:   During the course of the visit the patient was educated and counseled about appropriate screening and preventive services including:    Pneumococcal vaccine   Influenza vaccine  Td vaccine  Screening electrocardiogram  Screening mammography  Bone densitometry screening  Colorectal cancer screening  Diabetes screening  Glaucoma screening  Nutrition counseling   Advanced directives: given information/requested  Screening recommendations, referrals:  Vaccinations: Tdap vaccine uptodate Influenza vaccine declined Pneumococcal vaccine declined Shingles vaccine declined Hep B vaccine not indicated  Nutrition assessed and recommended  Colonoscopy uptodate Mammogram requested Pap smear not indicated Pelvic exam not indicated Recommended yearly  ophthalmology/optometry visit for glaucoma screening and checkup Recommended yearly dental visit for hygiene and checkup Advanced directives - given information  Conditions/risks identified: BMI: Discussed weight loss, diet, and increase physical activity.  Increase physical activity: AHA recommends 150 minutes of physical activity a week.  Medications reviewed DEXA- declined Urinary Incontinence is not an issue, can rarely : discussed non pharmacology and pharmacology options.  Fall risk: low- discussed PT, home fall assessment, medications.   Subjective:   KRISTOPHER DELK is a 73 y.o. female who presents for Medicare Annual Wellness Visit and complete physical.    Date of last medicare wellness visit is unknown.  Her blood pressure has been controlled at home, today their BP is BP: 120/78 mmHg She does not workout, she use to walk but states she got tired of walking by herself. She denies chest pain, shortness of breath, dizziness.  She is not on cholesterol medication and denies myalgias. Her cholesterol is at goal. The cholesterol last visit was:   Lab Results  Component Value Date   CHOL 204* 01/19/2013   HDL 74 01/19/2013   LDLCALC 109* 01/19/2013   TRIG 103 01/19/2013   CHOLHDL 2.8 01/19/2013   She has been working on diet and exercise for prediabetes, and denies polydipsia, polyuria and visual disturbances. Last A1C in the office was:  Lab Results  Component Value Date   HGBA1C 6.0* 01/19/2013   Patient is on Vitamin D supplement.   She was in [place] for 3 different graduations for her grand sons, masters, college and highschool so she has been eating poorly.   Names of Other Physician/Practitioners you currently use: 1. [facility]- here for primary care 2. Dr. Ian Malkin eye doctor, last visit Sept 2014 Patient Care Team: Unk Pinto, MD as  PCP - General (Internal Medicine) Juanita Craver, MD as Consulting Physician  (Gastroenterology)   Medication Review Current Outpatient Prescriptions on File Prior to Visit  Medication Sig Dispense Refill  . Calcium-Vitamin D-Vitamin K (CALCIUM SOFT CHEWS PO) Take by mouth daily.      . Cholecalciferol (VITAMIN D PO) Take 5,000 Int'l Units by mouth daily.      . IRON PO Take by mouth daily.      Marland Kitchen levobunolol (BETAGAN) 0.5 % ophthalmic solution at bedtime.      . Travoprost, BAK Free, (TRAVATAN Z) 0.004 % SOLN ophthalmic solution 1 drop at bedtime.       No current facility-administered medications on file prior to visit.    Current Problems (verified) Patient Active Problem List   Diagnosis Date Noted  . Vitamin D Deficiency 04/04/2013  . Encounter for long-term (current) use of other medications 04/04/2013  . Prediabetes 01/19/2013  . Anemia   . GERD (gastroesophageal reflux disease)   . Hyperlipidemia   . Hypertension   . Thyroid disease     Screening Tests Health Maintenance  Topic Date Due  . Zostavax  10/17/2000  . Pneumococcal Polysaccharide Vaccine Age 55 And Over  10/17/2005  . Influenza Vaccine  08/14/2013  . Mammogram  09/30/2013  . Tetanus/tdap  07/01/2022  . Colonoscopy  09/24/2022    Immunization History  Administered Date(s) Administered  . Td 06/30/2012    Preventative care: Last colonoscopy: 2014 due 2019 Last mammogram: March 2013 DUE Last pap smear/pelvic exam: 2012   DEXA:2013 DUE but declines  Prior vaccinations: TD or Tdap: 2014 Influenza: declines Pneumococcal: declines Shingles/Zostavax: declines  History reviewed: allergies, current medications, past family history, past medical history, past social history, past surgical history and problem list  Risk Factors: Osteoporosis: postmenopausal estrogen deficiency and dietary calcium and/or vitamin D deficiency History of fracture in the past year: no  Tobacco History  Substance Use Topics  . Smoking status: Never Smoker   . Smokeless tobacco: Never Used   . Alcohol Use: No   She does not smoke.  Patient is not a former smoker. Are there smokers in your home (other than you)?  No  Alcohol Current alcohol use: none  Caffeine Current caffeine use: coffee 1 /day  Exercise  Current exercise: none  Nutrition/Diet Current diet: in general, a "healthy" diet    Cardiac risk factors: advanced age (older than 21 for men, 38 for women), dyslipidemia, hypertension and sedentary lifestyle.  Depression Screen (Note: if answer to either of the following is "Yes", a more complete depression screening is indicated)   Q1: Over the past two weeks, have you felt down, depressed or hopeless? No  Q2: Over the past two weeks, have you felt little interest or pleasure in doing things? No  Have you lost interest or pleasure in daily life? No  Do you often feel hopeless? No  Do you cry easily over simple problems? No  Activities of Daily Living In your present state of health, do you have any difficulty performing the following activities?:  Driving? No Managing money?  No Feeding yourself? No Getting from bed to chair? No Climbing a flight of stairs? No Preparing food and eating?: No Bathing or showering? No Getting dressed: No Getting to the toilet? No Using the toilet:No Moving around from place to place: No In the past year have you fallen or had a near fall?:No   Are you sexually active?  No  Do you have more than  one partner?  No  Vision Difficulties: No  Hearing Difficulties: No Do you often ask people to speak up or repeat themselves? No Do you experience ringing or noises in your ears? No Do you have difficulty understanding soft or whispered voices? No  Cognition  Do you feel that you have a problem with memory?Yes  Do you often misplace items? No  Do you feel safe at home?  Yes  Advanced directives Does patient have a [place]? No Does patient have a Living Will? No   Objective:     Blood  pressure 120/78, pulse 68, temperature 98.5 F (36.9 C), resp. rate 16, height 5' 4.5" (1.638 m), weight 146 lb (66.225 kg). Body mass index is 24.68 kg/(m^2).  General appearance: alert, no distress, WD/WN,  female Cognitive Testing  Alert? Yes  Normal Appearance?Yes  Oriented to person? Yes  Place? Yes   Time? Yes  Recall of three objects?  1/3  Can perform simple calculations? Yes  Displays appropriate judgment?Yes  Can read the correct time from a watch face?Yes  HEENT: normocephalic, sclerae anicteric, TMs pearly, nares patent, no discharge or erythema, pharynx normal Oral cavity: MMM, no lesions Neck: supple, no lymphadenopathy, no thyromegaly, no masses Heart: RRR, normal S1, S2, no murmurs Lungs: CTA bilaterally, no wheezes, rhonchi, or rales Abdomen: +bs, soft, non tender, non distended, no masses, no hepatomegaly, no splenomegaly Musculoskeletal: nontender, no swelling, no obvious deformity Extremities: no edema, no cyanosis, no clubbing Skin: Seb keratosis over back and AB Pulses: 2+ symmetric, upper and lower extremities, normal cap refill Neurological: alert, oriented x 3, CN2-12 intact, strength normal upper extremities and lower extremities, sensation normal throughout, DTRs 2+ throughout, no cerebellar signs, gait normal Psychiatric: normal affect, behavior normal, pleasant  Breast:  nontender, with several mobile round lumps, no skin changes, no nipple discharge or inversion, no axillary lymphadenopathy Gyn: defer  Rectal: defer  Medicare Attestation I have personally reviewed: The patient's medical and social history Their use of alcohol, tobacco or illicit drugs Their current medications and supplements The patient's functional ability including ADLs,fall risks, home safety risks, cognitive, and hearing and visual impairment Diet and physical activities Evidence for depression or mood disorders  The patient's weight, height, BMI, and visual acuity have been  recorded in the chart.  I have made referrals, counseling, and provided education to the patient based on review of the above and I have provided the patient with a written personalized care plan for preventive services.     Vicie Mutters, PA-C   07/06/2013

## 2013-07-07 LAB — URINALYSIS, MICROSCOPIC ONLY
Bacteria, UA: NONE SEEN
CASTS: NONE SEEN
Crystals: NONE SEEN
Squamous Epithelial / LPF: NONE SEEN

## 2013-07-07 LAB — BASIC METABOLIC PANEL WITH GFR
BUN: 11 mg/dL (ref 6–23)
CALCIUM: 9.9 mg/dL (ref 8.4–10.5)
CHLORIDE: 103 meq/L (ref 96–112)
CO2: 28 meq/L (ref 19–32)
CREATININE: 1.01 mg/dL (ref 0.50–1.10)
GFR, Est African American: 64 mL/min
GFR, Est Non African American: 56 mL/min — ABNORMAL LOW
Glucose, Bld: 96 mg/dL (ref 70–99)
Potassium: 4.3 mEq/L (ref 3.5–5.3)
Sodium: 141 mEq/L (ref 135–145)

## 2013-07-07 LAB — HEMOGLOBIN A1C
HEMOGLOBIN A1C: 5.5 % (ref <5.7)
Mean Plasma Glucose: 111 mg/dL (ref <117)

## 2013-07-07 LAB — LIPID PANEL
CHOLESTEROL: 236 mg/dL — AB (ref 0–200)
HDL: 94 mg/dL (ref >39)
LDL Cholesterol: 129 mg/dL — ABNORMAL HIGH (ref 0–99)
Total CHOL/HDL Ratio: 2.5 Ratio
Triglycerides: 66 mg/dL (ref <150)
VLDL: 13 mg/dL (ref 0–40)

## 2013-07-07 LAB — VITAMIN B12: VITAMIN B 12: 302 pg/mL (ref 211–911)

## 2013-07-07 LAB — URINALYSIS, ROUTINE W REFLEX MICROSCOPIC
Bilirubin Urine: NEGATIVE
Glucose, UA: NEGATIVE mg/dL
KETONES UR: NEGATIVE mg/dL
Leukocytes, UA: NEGATIVE
NITRITE: NEGATIVE
PH: 6.5 (ref 5.0–8.0)
PROTEIN: NEGATIVE mg/dL
Specific Gravity, Urine: 1.012 (ref 1.005–1.030)
Urobilinogen, UA: 1 mg/dL (ref 0.0–1.0)

## 2013-07-07 LAB — IRON AND TIBC
%SAT: 22 % (ref 20–55)
IRON: 60 ug/dL (ref 42–145)
TIBC: 273 ug/dL (ref 250–470)
UIBC: 213 ug/dL (ref 125–400)

## 2013-07-07 LAB — URINE CULTURE
Colony Count: NO GROWTH
Organism ID, Bacteria: NO GROWTH

## 2013-07-07 LAB — HEPATIC FUNCTION PANEL
ALBUMIN: 4.4 g/dL (ref 3.5–5.2)
ALT: 11 U/L (ref 0–35)
AST: 15 U/L (ref 0–37)
Alkaline Phosphatase: 53 U/L (ref 39–117)
Bilirubin, Direct: 0.1 mg/dL (ref 0.0–0.3)
Indirect Bilirubin: 0.4 mg/dL (ref 0.2–1.2)
Total Bilirubin: 0.5 mg/dL (ref 0.2–1.2)
Total Protein: 7.4 g/dL (ref 6.0–8.3)

## 2013-07-07 LAB — MICROALBUMIN / CREATININE URINE RATIO
Creatinine, Urine: 93.7 mg/dL
MICROALB/CREAT RATIO: 5.3 mg/g (ref 0.0–30.0)
Microalb, Ur: 0.5 mg/dL (ref 0.00–1.89)

## 2013-07-07 LAB — TSH: TSH: 0.34 u[IU]/mL — ABNORMAL LOW (ref 0.350–4.500)

## 2013-07-07 LAB — VITAMIN D 25 HYDROXY (VIT D DEFICIENCY, FRACTURES): VIT D 25 HYDROXY: 57 ng/mL (ref 30–89)

## 2013-07-07 LAB — INSULIN, FASTING: INSULIN FASTING, SERUM: 17 u[IU]/mL (ref 3–28)

## 2013-07-07 LAB — MAGNESIUM: Magnesium: 2.3 mg/dL (ref 1.5–2.5)

## 2013-07-20 ENCOUNTER — Other Ambulatory Visit: Payer: Self-pay | Admitting: Physician Assistant

## 2013-07-20 DIAGNOSIS — Z1231 Encounter for screening mammogram for malignant neoplasm of breast: Secondary | ICD-10-CM

## 2013-07-22 ENCOUNTER — Ambulatory Visit (HOSPITAL_COMMUNITY)
Admission: RE | Admit: 2013-07-22 | Discharge: 2013-07-22 | Disposition: A | Discharge status: Home / Self Care | Payer: Medicare PPO | Source: Ambulatory Visit | Attending: Physician Assistant | Admitting: Physician Assistant

## 2013-07-22 DIAGNOSIS — Z1231 Encounter for screening mammogram for malignant neoplasm of breast: Secondary | ICD-10-CM

## 2013-07-23 ENCOUNTER — Other Ambulatory Visit: Payer: Self-pay | Admitting: Physician Assistant

## 2013-07-23 DIAGNOSIS — N63 Unspecified lump in unspecified breast: Secondary | ICD-10-CM

## 2013-08-04 ENCOUNTER — Ambulatory Visit
Admission: RE | Admit: 2013-08-04 | Discharge: 2013-08-04 | Disposition: A | Discharge status: Home / Self Care | Payer: Commercial Managed Care - HMO | Source: Ambulatory Visit | Attending: Physician Assistant | Admitting: Physician Assistant

## 2013-08-04 DIAGNOSIS — N63 Unspecified lump in unspecified breast: Secondary | ICD-10-CM

## 2013-08-10 ENCOUNTER — Encounter: Payer: Self-pay | Admitting: Internal Medicine

## 2013-08-10 ENCOUNTER — Ambulatory Visit (INDEPENDENT_AMBULATORY_CARE_PROVIDER_SITE_OTHER): Payer: Commercial Managed Care - HMO | Admitting: Internal Medicine

## 2013-08-10 VITALS — BP 118/74 | HR 64 | Temp 97.9°F | Resp 16 | Ht 64.5 in | Wt 145.8 lb

## 2013-08-10 DIAGNOSIS — R319 Hematuria, unspecified: Secondary | ICD-10-CM

## 2013-08-10 DIAGNOSIS — E0590 Thyrotoxicosis, unspecified without thyrotoxic crisis or storm: Secondary | ICD-10-CM

## 2013-08-10 LAB — TSH: TSH: 0.559 u[IU]/mL (ref 0.350–4.500)

## 2013-08-10 NOTE — Patient Instructions (Signed)
Recommend the book "The END of DIETING" by Dr Baker Janus   At [email or web address] - get book & Audio CD's     It is very important that you work harder with diet by avoiding all foods that are white except chicken & fish. Avoid white rice (brown & wild rice is OK), white potatoes (sweetpotatoes in moderation is OK), White bread or wheat bread or anything made out of white flour like bagels, donuts, rolls, buns, biscuits, cakes, pastries, cookies, pizza crust, and pasta (made from white flour & egg whites) - vegetarian pasta or spinach or wheat pasta is OK. Multigrain breads like Arnold's or Pepperidge [employer], or multigrain sandwich thins or flatbreads.  Diet, exercise and weight loss can reverse and cure diabetes in the early stages.  Diet, exercise and weight loss is very important in the control and prevention of complications of diabetes which affects every system in your body, ie. Brain - dementia/stroke, eyes - glaucoma/blindness, heart - heart attack/heart failure, kidneys - dialysis, stomach - gastric paralysis, intestines - malabsorption, nerves - severe painful neuritis, circulation - gangrene & loss of a leg(s), and finally cancer and Alzheimers.    I recommend avoid fried & greasy foods,  sweets/candy, white rice (brown or wild rice or Quinoa is OK), white potatoes (sweet potatoes are OK) - anything made from white flour - bagels, doughnuts, rolls, buns, biscuits,white and wheat breads, pizza crust and traditional pasta made of white flour & egg white(vegetarian pasta or spinach or wheat pasta is OK).  Multi-grain bread is OK - like multi-grain flat bread or sandwich thins. Avoid alcohol in excess. Exercise is also important.    Eat all the vegetables you want - avoid meat, especially red meat and dairy - especially cheese.  Cheese is the most concentrated form of trans-fats which is the worst thing to clog up our arteries. Veggie cheese is OK which can be found in the fresh produce section at  [employer] or [employer] or [employer]     High Cholesterol High cholesterol refers to having a high level of cholesterol in your blood. Cholesterol is a white, waxy, fat-like protein that your body needs in small amounts. Your liver makes all the cholesterol you need. Excess cholesterol comes from the food you eat. Cholesterol travels in your bloodstream through your blood vessels. If you have high cholesterol, deposits (plaque) may build up on the walls of your blood vessels. This makes the arteries narrower and stiffer. Plaque increases your risk of heart attack and stroke. Work with your health care provider to keep your cholesterol levels in a healthy range. RISK FACTORS Several things can make you more likely to have high cholesterol. These include:   Eating foods high in animal fat (saturated fat) or cholesterol.  Being overweight.  Not getting enough exercise.  Having a family history of high cholesterol. SIGNS AND SYMPTOMS High cholesterol does not cause symptoms. DIAGNOSIS  Your health care provider can do a blood test to check whether you have high cholesterol. If you are older than 20, your health care provider may check your cholesterol every 4-6 years. You may be checked more often if you already have high cholesterol or other risk factors for heart disease. The blood test for cholesterol measures the following:  Bad cholesterol (LDL cholesterol). This is the type of cholesterol that causes heart disease. This number should be less than 100.  Good cholesterol (HDL cholesterol). This type helps protect against heart disease. A healthy level of  HDL cholesterol is 60 or higher.  Total cholesterol. This is the combined number of LDL cholesterol and HDL cholesterol. A healthy number is less than 200. TREATMENT  High cholesterol can be treated with diet changes, lifestyle changes, and medicine.   Diet changes may include eating more whole grains, fruits, vegetables, nuts,  and fish. You may also have to cut back on red meat and foods with a lot of added sugar.  Lifestyle changes may include getting at least 40 minutes of aerobic exercise three times a week. Aerobic exercises include walking, biking, and swimming. Aerobic exercise along with a healthy diet can help you maintain a healthy weight. Lifestyle changes may also include quitting smoking.  If diet and lifestyle changes are not enough to lower your cholesterol, your health care provider may prescribe a statin medicine. This medicine has been shown to lower cholesterol and also lower the risk of heart disease. HOME CARE INSTRUCTIONS  Only take over-the-counter or prescription medicines as directed by your health care provider.   Follow a healthy diet as directed by your health care provider. For instance:   Eat chicken (without skin), fish, veal, shellfish, ground [place] breast, and round or loin cuts of red meat.  Do not eat fried foods and fatty meats, such as hot dogs and salami.   Eat plenty of fruits, such as apples.   Eat plenty of vegetables, such as broccoli, potatoes, and carrots.   Eat beans, peas, and lentils.   Eat grains, such as barley, rice, couscous, and bulgur wheat.   Eat pasta without cream sauces.   Use skim or nonfat milk and low-fat or nonfat yogurt and cheeses. Do not eat or drink whole milk, cream, ice cream, egg yolks, and hard cheeses.   Do not eat stick margarine or tub margarines that contain trans fats (also called partially hydrogenated oils).   Do not eat cakes, cookies, crackers, or other baked goods that contain trans fats.   Do not eat saturated tropical oils, such as coconut and palm oil.   Exercise as directed by your health care provider. Increase your activity level with activities such as gardening or walking.   Keep all follow-up appointments.  SEEK MEDICAL CARE IF:  You are struggling to maintain a healthy diet or weight.  You need  help starting an exercise program.  You need help to stop smoking. SEEK IMMEDIATE MEDICAL CARE IF:  You have chest pain.  You have trouble breathing. Document Released: 12/31/2004 Document Revised: 05/17/2013 Document Reviewed: 10/23/2012 [facility] Patient Information 2015 [place], [place]. This information is not intended to replace advice given to you by your health care provider. Make sure you discuss any questions you have with your health care provider.

## 2013-08-10 NOTE — Progress Notes (Deleted)
Patient ID: Robin Zimmerman, female   DOB: [birth year], 73 y.o.   MRN: [id number]

## 2013-08-10 NOTE — Progress Notes (Signed)
   Subjective:    Patient ID: Robin Zimmerman, female    DOB: April 16, 1940, 73 y.o.   MRN: [id number]  HPI  Patient presents for 1 month f/u to recheck U/A for blood (showed (+) Hgb dip but no cells on micro -Patient feeld the blood came from wiping an inflamed hemorrhoid. Also, her TSH was suppressed at 0.340 and  note it has been at the lower limit of normal at previous measurement.  Patient denies any UT Sx's and likewise has had no Sx's suspect for hyperthyroidism.   Medication List   CALCIUM SOFT CHEWS PO  Take by mouth daily.     IRON PO  Take by mouth daily.     levobunolol 0.5 % ophthalmic solution  Commonly known as:  BETAGAN  1 drop daily. In AM     loratadine 10 MG tablet  Commonly known as:  CLARITIN  Take 10 mg by mouth daily.     TRAVATAN Z 0.004 % Soln ophthalmic solution  Generic drug:  Travoprost (BAK Free)  1 drop at bedtime.     VITAMIN D PO  Take 5,000 Int'l Units by mouth daily.       No Known Allergies Past Medical History  Diagnosis Date  . Anemia   . GERD (gastroesophageal reflux disease)   . Hyperlipidemia   . Hypertension   . Thyroid disease   . Glaucoma    Past Surgical History  Procedure Laterality Date  . Tubal ligation Right   . Cholecystectomy    . Cardiac valve replacement     Review of Systems  In addition to the HPI above,  No Fever-chills,  No Headache, No changes with Vision or hearing,  No problems swallowing food or Liquids,  No Chest pain or productive Cough or Shortness of Breath,  No Abdominal pain, No Nausea or Vomitting, Bowel movements are regular,  No dysuria,frequency , urgency or blood in urine, No new skin rashes or bruises,  No new joints pains-aches,  No new weakness, tingling, numbness in any extremity,  No recent weight loss,  No polyuria, polydypsia or polyphagia,  No significant Mental Stressors.  A full 10 point Review of Systems was done, except as stated above, all other Review of Systems were  negative  Objective:   Physical Exam   BP 118/74  Pulse 64  Temp(Src) 97.9 F (36.6 C) (Temporal)  Resp 16  Ht 5' 4.5" (1.638 m)  Wt 145 lb 12.8 oz (66.134 kg)  BMI 24.65 kg/m2  HEENT - Eac's patent. TM's Nl. EOM's full. PERRLA. NasoOroPharynx clear. Neck - supple. Nl Thyroid. Carotids 2+ & No bruits, nodes, JVD Chest - Clear equal BS w/o Rales, rhonchi, wheezes. Cor - Nl HS. RRR w/o sig MGR. PP 1(+). No edema. Abd - No palpable organomegaly, masses or tenderness. BS nl. MS- FROM w/o deformities. Muscle power, tone and bulk Nl. Gait Nl. Neuro - No obvious Cr N abnormalities. Sensory, motor and Cerebellar functions appear Nl w/o focal abnormalities. Skin - clear w/o rashes.  Assessment & Plan:   1. Hyperthyroidism - TSH  2. Hematuria - Urine Microscopic - Urine culture

## 2013-08-11 LAB — URINE CULTURE
COLONY COUNT: NO GROWTH
Organism ID, Bacteria: NO GROWTH

## 2013-08-11 LAB — URINALYSIS, MICROSCOPIC ONLY
BACTERIA UA: NONE SEEN
CRYSTALS: NONE SEEN
Casts: NONE SEEN
Squamous Epithelial / LPF: NONE SEEN

## 2014-01-05 ENCOUNTER — Ambulatory Visit (INDEPENDENT_AMBULATORY_CARE_PROVIDER_SITE_OTHER): Payer: Commercial Managed Care - HMO | Admitting: Internal Medicine

## 2014-01-05 ENCOUNTER — Encounter: Payer: Self-pay | Admitting: Internal Medicine

## 2014-01-05 VITALS — BP 120/74 | HR 64 | Temp 97.9°F | Resp 16 | Ht 64.5 in | Wt 141.8 lb

## 2014-01-05 DIAGNOSIS — I10 Essential (primary) hypertension: Secondary | ICD-10-CM

## 2014-01-05 DIAGNOSIS — R7303 Prediabetes: Secondary | ICD-10-CM

## 2014-01-05 DIAGNOSIS — E559 Vitamin D deficiency, unspecified: Secondary | ICD-10-CM

## 2014-01-05 DIAGNOSIS — H6121 Impacted cerumen, right ear: Secondary | ICD-10-CM

## 2014-01-05 DIAGNOSIS — R7309 Other abnormal glucose: Secondary | ICD-10-CM

## 2014-01-05 DIAGNOSIS — E039 Hypothyroidism, unspecified: Secondary | ICD-10-CM

## 2014-01-05 DIAGNOSIS — E785 Hyperlipidemia, unspecified: Secondary | ICD-10-CM

## 2014-01-05 DIAGNOSIS — Z79899 Other long term (current) drug therapy: Secondary | ICD-10-CM

## 2014-01-05 NOTE — Patient Instructions (Signed)

## 2014-01-06 LAB — CBC WITH DIFFERENTIAL/PLATELET
BASOS ABS: 0 10*3/uL (ref 0.0–0.1)
Basophils Relative: 0 % (ref 0–1)
Eosinophils Absolute: 0.1 10*3/uL (ref 0.0–0.7)
Eosinophils Relative: 2 % (ref 0–5)
HCT: 38.4 % (ref 36.0–46.0)
Hemoglobin: 12 g/dL (ref 12.0–15.0)
LYMPHS PCT: 46 % (ref 12–46)
Lymphs Abs: 1.5 10*3/uL (ref 0.7–4.0)
MCH: 28.4 pg (ref 26.0–34.0)
MCHC: 31.3 g/dL (ref 30.0–36.0)
MCV: 90.8 fL (ref 78.0–100.0)
MPV: 9.4 fL (ref 9.4–12.4)
Monocytes Absolute: 0.2 10*3/uL (ref 0.1–1.0)
Monocytes Relative: 7 % (ref 3–12)
NEUTROS ABS: 1.4 10*3/uL — AB (ref 1.7–7.7)
NEUTROS PCT: 45 % (ref 43–77)
Platelets: 267 10*3/uL (ref 150–400)
RBC: 4.23 MIL/uL (ref 3.87–5.11)
RDW: 13.6 % (ref 11.5–15.5)
WBC: 3.2 10*3/uL — AB (ref 4.0–10.5)

## 2014-01-06 LAB — BASIC METABOLIC PANEL WITH GFR
BUN: 13 mg/dL (ref 6–23)
CHLORIDE: 104 meq/L (ref 96–112)
CO2: 30 mEq/L (ref 19–32)
CREATININE: 0.97 mg/dL (ref 0.50–1.10)
Calcium: 9.8 mg/dL (ref 8.4–10.5)
GFR, EST NON AFRICAN AMERICAN: 58 mL/min — AB
GFR, Est African American: 67 mL/min
Glucose, Bld: 70 mg/dL (ref 70–99)
POTASSIUM: 4.2 meq/L (ref 3.5–5.3)
SODIUM: 143 meq/L (ref 135–145)

## 2014-01-06 LAB — LIPID PANEL
CHOL/HDL RATIO: 2.7 ratio
CHOLESTEROL: 220 mg/dL — AB (ref 0–200)
HDL: 81 mg/dL (ref >39)
LDL Cholesterol: 124 mg/dL — ABNORMAL HIGH (ref 0–99)
Triglycerides: 76 mg/dL (ref <150)
VLDL: 15 mg/dL (ref 0–40)

## 2014-01-06 LAB — HEPATIC FUNCTION PANEL
ALBUMIN: 4.2 g/dL (ref 3.5–5.2)
ALT: 10 U/L (ref 0–35)
AST: 16 U/L (ref 0–37)
Alkaline Phosphatase: 71 U/L (ref 39–117)
BILIRUBIN INDIRECT: 0.5 mg/dL (ref 0.2–1.2)
BILIRUBIN TOTAL: 0.6 mg/dL (ref 0.2–1.2)
Bilirubin, Direct: 0.1 mg/dL (ref 0.0–0.3)
Total Protein: 7.6 g/dL (ref 6.0–8.3)

## 2014-01-06 LAB — INSULIN, FASTING: Insulin fasting, serum: 11 u[IU]/mL (ref 2.0–19.6)

## 2014-01-06 LAB — TSH: TSH: 0.839 u[IU]/mL (ref 0.350–4.500)

## 2014-01-06 LAB — HEMOGLOBIN A1C
Hgb A1c MFr Bld: 5.9 % — ABNORMAL HIGH (ref <5.7)
Mean Plasma Glucose: 123 mg/dL — ABNORMAL HIGH (ref <117)

## 2014-01-06 LAB — MAGNESIUM: MAGNESIUM: 2.2 mg/dL (ref 1.5–2.5)

## 2014-01-06 LAB — VITAMIN D 25 HYDROXY (VIT D DEFICIENCY, FRACTURES): VIT D 25 HYDROXY: 38 ng/mL (ref 30–100)

## 2014-01-09 NOTE — Progress Notes (Signed)
Patient ID: Robin Zimmerman, female   DOB: June 21, 1940, 73 y.o.   MRN: [id number]   This very nice 73 y.o.female presents for 3 month follow up with Hypertension, Hyperlipidemia, Pre-Diabetes and Vitamin D Deficiency.    Patient is treated for HTN & BP has been controlled at home. Today's BP was 120/74 mmHg. Patient has had no complaints of any cardiac type chest pain, palpitations, dyspnea/orthopnea/PND, dizziness, claudication, or dependent edema.   Hyperlipidemia is controlled with diet & meds. Patient denies myalgias or other med SE's.  Last Lipids were not at goal - Total Chol 220; HDL 81; LDL  124; Trig 76 on 01/05/2014.   Also, the patient has history of PreDiabetes and has had no symptoms of reactive hypoglycemia, diabetic polys, paresthesias or visual blurring.  Last A1c was  5.9% on 01/05/2014.c   Further, the patient also has history of Vitamin D Deficiency and supplements vitamin D without any suspected side-effects. Last vitamin D was  Low - 38 on 01/05/2014.   Medication List   IRON PO  Take by mouth daily.     levobunolol 0.5 % ophthalmic solution  Commonly known as:  BETAGAN  1 drop daily. In AM     loratadine 10 MG tablet  Commonly known as:  CLARITIN  Take 10 mg by mouth daily.     TRAVATAN Z 0.004 % Soln ophthalmic solution  Generic drug:  Travoprost (BAK Free)  1 drop at bedtime.     VITAMIN D PO  Take 5,000 Int'l Units by mouth daily.     No Known Allergies  PMHx:   Past Medical History  Diagnosis Date  . Anemia   . GERD (gastroesophageal reflux disease)   . Hyperlipidemia   . Hypertension   . Thyroid disease   . Glaucoma    Immunization History  Administered Date(s) Administered  . Td 06/30/2012   Past Surgical History  Procedure Laterality Date  . Tubal ligation Right   . Cholecystectomy    . Cardiac valve replacement     FHx:    Reviewed / unchanged  SHx:    Reviewed / unchanged  Systems Review:  Constitutional: Denies fever,  chills, wt changes, headaches, insomnia, fatigue, night sweats, change in appetite. Eyes: Denies redness, blurred vision, diplopia, discharge, itchy, watery eyes.  ENT: Denies discharge, congestion, post nasal drip, epistaxis, sore throat, earache, hearing loss, dental pain, tinnitus, vertigo, sinus pain, snoring.  CV: Denies chest pain, palpitations, irregular heartbeat, syncope, dyspnea, diaphoresis, orthopnea, PND, claudication or edema. Respiratory: denies cough, dyspnea, DOE, pleurisy, hoarseness, laryngitis, wheezing.  Gastrointestinal: Denies dysphagia, odynophagia, heartburn, reflux, water brash, abdominal pain or cramps, nausea, vomiting, bloating, diarrhea, constipation, hematemesis, melena, hematochezia  or hemorrhoids. Genitourinary: Denies dysuria, frequency, urgency, nocturia, hesitancy, discharge, hematuria or flank pain. Musculoskeletal: Denies arthralgias, myalgias, stiffness, jt. swelling, pain, limping or strain/sprain.  Skin: Denies pruritus, rash, hives, warts, acne, eczema or change in skin lesion(s). Neuro: No weakness, tremor, incoordination, spasms, paresthesia or pain. Psychiatric: Denies confusion, memory loss or sensory loss. Endo: Denies change in weight, skin or hair change.  Heme/Lymph: No excessive bleeding, bruising or enlarged lymph nodes.  Physical Exam  BP 120/74 Pulse 64  Temp 97.9 F   Resp 16  Ht 5' 4.5"   Wt 141 lb 12.8 oz     BMI 23.97  Appears well nourished and in no distress.  Eyes: PERRLA, EOMs, conjunctiva no swelling or erythema. Sinuses: No frontal/maxillary tenderness ENT/Mouth: EAC's clear, TM's nl w/o erythema, bulging.  Nares clear w/o erythema, swelling, exudates. Oropharynx clear without erythema or exudates. Oral hygiene is good. Tongue normal, non obstructing. Hearing intact.  Neck: Supple. Thyroid nl. Car 2+/2+ without bruits, nodes or JVD. Chest: Respirations nl with BS clear & equal w/o rales, rhonchi, wheezing or stridor.  Cor:  Heart sounds normal w/ regular rate and rhythm without sig. murmurs, gallops, clicks, or rubs. Peripheral pulses normal and equal  without edema.  Abdomen: Soft & bowel sounds normal. Non-tender w/o guarding, rebound, hernias, masses, or organomegaly.  Lymphatics: Unremarkable.  Musculoskeletal: Full ROM all peripheral extremities, joint stability, 5/5 strength, and normal gait.  Skin: Warm, dry without exposed rashes, lesions or ecchymosis apparent.  Neuro: Cranial nerves intact, reflexes equal bilaterally. Sensory-motor testing grossly intact. Tendon reflexes grossly intact.  Pysch: Alert & oriented x 3.  Insight and judgement nl & appropriate. No ideations.  Assessment and Plan:  1. Hypertension - Continue monitor blood pressure at home. Continue diet/meds same.  2. Hyperlipidemia - Continue diet/meds, exercise,& lifestyle modifications. Continue monitor periodic cholesterol/liver & renal functions   3.  Pre-Diabetes - Continue diet, exercise, lifestyle modifications. Monitor appropriate labs.  4. Vitamin D Deficiency - Continue supplementation.   Recommended regular exercise, BP monitoring, weight control, and discussed med and SE's. Recommended labs to assess and monitor clinical status. Further disposition pending results of labs.

## 2014-01-09 NOTE — Progress Notes (Signed)
Patient ID: Robin Zimmerman, female   DOB: 03-18-1940, 73 y.o.   MRN: [id number]

## 2014-01-09 NOTE — Addendum Note (Signed)
Addended by: Unk Pinto on: 01/09/2014 11:10 PM   Modules accepted: Level of Service

## 2014-04-14 ENCOUNTER — Ambulatory Visit (INDEPENDENT_AMBULATORY_CARE_PROVIDER_SITE_OTHER): Payer: Commercial Managed Care - HMO | Admitting: Physician Assistant

## 2014-04-14 ENCOUNTER — Encounter: Payer: Self-pay | Admitting: Physician Assistant

## 2014-04-14 VITALS — BP 110/68 | HR 68 | Temp 97.7°F | Resp 16 | Ht 64.5 in | Wt 140.0 lb

## 2014-04-14 DIAGNOSIS — E785 Hyperlipidemia, unspecified: Secondary | ICD-10-CM | POA: Diagnosis not present

## 2014-04-14 DIAGNOSIS — R7309 Other abnormal glucose: Secondary | ICD-10-CM

## 2014-04-14 DIAGNOSIS — R7303 Prediabetes: Secondary | ICD-10-CM

## 2014-04-14 DIAGNOSIS — I10 Essential (primary) hypertension: Secondary | ICD-10-CM | POA: Diagnosis not present

## 2014-04-14 DIAGNOSIS — H409 Unspecified glaucoma: Secondary | ICD-10-CM | POA: Insufficient documentation

## 2014-04-14 DIAGNOSIS — Z79899 Other long term (current) drug therapy: Secondary | ICD-10-CM

## 2014-04-14 DIAGNOSIS — E559 Vitamin D deficiency, unspecified: Secondary | ICD-10-CM

## 2014-04-14 NOTE — Patient Instructions (Signed)

## 2014-04-14 NOTE — Progress Notes (Signed)
Assessment and Plan:  1. Hypertension -Continue medication, monitor blood pressure at home. Continue DASH diet.  Reminder to go to the ER if any CP, SOB, nausea, dizziness, severe HA, changes vision/speech, left arm numbness and tingling and jaw pain.  2. Cholesterol -Continue diet and exercise.   3. Prediabetes  -Continue diet and exercise.   4. Vitamin D Def - check level and continue medications.   Continue diet and meds as discussed. Further disposition pending results of labs. Over 30 minutes of exam, counseling, chart review, and critical decision making was performed. Will get labs at CPE.   Future Appointments Date Time Provider [place]  07/14/2014 2:00 PM Vicie Mutters, PA-C [id number] None  will do every 6 months after CPE  HPI 74 y.o. female  presents for 3 month follow up on hypertension, cholesterol, prediabetes, and vitamin D deficiency.   Her blood pressure has been controlled at home, today their BP is BP: 110/68 mmHg  She does not workout. She denies chest pain, shortness of breath, dizziness.  She is not on cholesterol medication and denies myalgias. Her cholesterol is at goal. The cholesterol last visit was:   Lab Results  Component Value Date   CHOL 220* 01/05/2014   HDL 81 01/05/2014   LDLCALC 124* 01/05/2014   TRIG 76 01/05/2014   CHOLHDL 2.7 01/05/2014   She has been working on diet and exercise for prediabetes, and denies paresthesia of the feet, polydipsia, polyuria and visual disturbances. Last A1C in the office was:  Lab Results  Component Value Date   HGBA1C 5.9* 01/05/2014  Patient is on Vitamin D supplement.   Lab Results  Component Value Date   VD25OH 16 01/05/2014   States she has a large family, 82 sisters/brother total, she does miss them and does not have family.  She admits to feeling down/depressed due to this, does have some memory issues. IE she came to the appointment yesterday, thinking yesterday was thursday.  When she is  feeling sad, she will call her family or get outside to walk/sit in the park which helps. She declines meds at this time.  Going to grandson's wedding in [place] in June.   Current Medications:  Current Outpatient Prescriptions on File Prior to Visit  Medication Sig Dispense Refill  . Calcium-Vitamin D-Vitamin K (CALCIUM SOFT CHEWS PO) Take by mouth daily.    . Cholecalciferol (VITAMIN D PO) Take 5,000 Int'l Units by mouth daily.    . IRON PO Take by mouth daily.    Marland Kitchen levobunolol (BETAGAN) 0.5 % ophthalmic solution 1 drop daily. In AM    . loratadine (CLARITIN) 10 MG tablet Take 10 mg by mouth daily.    . Travoprost, BAK Free, (TRAVATAN Z) 0.004 % SOLN ophthalmic solution 1 drop at bedtime.     No current facility-administered medications on file prior to visit.   Medical History:  Past Medical History  Diagnosis Date  . Anemia   . GERD (gastroesophageal reflux disease)   . Hyperlipidemia   . Hypertension   . Thyroid disease   . Glaucoma    Allergies: No Known Allergies   Review of Systems:  Review of Systems  Constitutional: Negative.   HENT: Negative.   Eyes: Negative.   Respiratory: Negative.   Cardiovascular: Negative.   Gastrointestinal: Negative.   Genitourinary: Negative.   Musculoskeletal: Negative.   Skin: Negative.   Neurological: Negative.   Endo/Heme/Allergies: Negative.   Psychiatric/Behavioral: Positive for depression and memory loss. Negative for suicidal  ideas, hallucinations and substance abuse. The patient is not nervous/anxious and does not have insomnia.     Family history- Review and unchanged Social history- Review and unchanged Physical Exam: BP 110/68 mmHg  Pulse 68  Temp(Src) 97.7 F (36.5 C)  Resp 16  Ht 5' 4.5" (1.638 m)  Wt 140 lb (63.504 kg)  BMI 23.67 kg/m2 Wt Readings from Last 3 Encounters:  04/14/14 140 lb (63.504 kg)  01/05/14 141 lb 12.8 oz (64.32 kg)  08/10/13 145 lb 12.8 oz (66.134 kg)   General Appearance: Well nourished,  in no apparent distress. Eyes: PERRLA, EOMs, conjunctiva no swelling or erythema Sinuses: No Frontal/maxillary tenderness ENT/Mouth: Ext aud canals clear, TMs without erythema, bulging. No erythema, swelling, or exudate on post pharynx.  Tonsils not swollen or erythematous. Hearing normal.  Neck: Supple, thyroid normal.  Respiratory: Respiratory effort normal, BS equal bilaterally without rales, rhonchi, wheezing or stridor.  Cardio: RRR with no MRGs. Brisk peripheral pulses without edema.  Abdomen: Soft, + BS,  Non tender, no guarding, rebound, hernias, masses. Lymphatics: Non tender without lymphadenopathy.  Musculoskeletal: Full ROM, 5/5 strength, Normal gait Skin: Warm, dry without rashes, lesions, ecchymosis.  Neuro: Cranial nerves intact. Normal muscle tone, no cerebellar symptoms. Psych: Awake and oriented X 3, normal affect, Insight and Judgment appropriate.    Vicie Mutters, PA-C 9:48 AM [facility]

## 2014-05-06 DIAGNOSIS — H4011X2 Primary open-angle glaucoma, moderate stage: Secondary | ICD-10-CM | POA: Diagnosis not present

## 2014-07-14 ENCOUNTER — Encounter: Payer: Self-pay | Admitting: Physician Assistant

## 2014-07-20 ENCOUNTER — Encounter: Payer: Self-pay | Admitting: Physician Assistant

## 2014-07-20 ENCOUNTER — Ambulatory Visit (INDEPENDENT_AMBULATORY_CARE_PROVIDER_SITE_OTHER): Payer: Commercial Managed Care - HMO | Admitting: Physician Assistant

## 2014-07-20 VITALS — BP 110/78 | HR 64 | Temp 97.7°F | Resp 16 | Ht 64.5 in | Wt 138.0 lb

## 2014-07-20 DIAGNOSIS — Z0001 Encounter for general adult medical examination with abnormal findings: Secondary | ICD-10-CM | POA: Diagnosis not present

## 2014-07-20 DIAGNOSIS — R413 Other amnesia: Secondary | ICD-10-CM | POA: Diagnosis not present

## 2014-07-20 DIAGNOSIS — D649 Anemia, unspecified: Secondary | ICD-10-CM | POA: Diagnosis not present

## 2014-07-20 DIAGNOSIS — Z6823 Body mass index (BMI) 23.0-23.9, adult: Secondary | ICD-10-CM | POA: Diagnosis not present

## 2014-07-20 DIAGNOSIS — K289 Gastrojejunal ulcer, unspecified as acute or chronic, without hemorrhage or perforation: Secondary | ICD-10-CM

## 2014-07-20 DIAGNOSIS — E785 Hyperlipidemia, unspecified: Secondary | ICD-10-CM | POA: Diagnosis not present

## 2014-07-20 DIAGNOSIS — E559 Vitamin D deficiency, unspecified: Secondary | ICD-10-CM | POA: Diagnosis not present

## 2014-07-20 DIAGNOSIS — Z789 Other specified health status: Secondary | ICD-10-CM

## 2014-07-20 DIAGNOSIS — K2100 Gastro-esophageal reflux disease with esophagitis, without bleeding: Secondary | ICD-10-CM

## 2014-07-20 DIAGNOSIS — R6889 Other general symptoms and signs: Secondary | ICD-10-CM | POA: Diagnosis not present

## 2014-07-20 DIAGNOSIS — R7303 Prediabetes: Secondary | ICD-10-CM

## 2014-07-20 DIAGNOSIS — B9681 Helicobacter pylori [H. pylori] as the cause of diseases classified elsewhere: Secondary | ICD-10-CM

## 2014-07-20 DIAGNOSIS — R7309 Other abnormal glucose: Secondary | ICD-10-CM

## 2014-07-20 DIAGNOSIS — Z0000 Encounter for general adult medical examination without abnormal findings: Secondary | ICD-10-CM

## 2014-07-20 DIAGNOSIS — E039 Hypothyroidism, unspecified: Secondary | ICD-10-CM

## 2014-07-20 DIAGNOSIS — I10 Essential (primary) hypertension: Secondary | ICD-10-CM | POA: Diagnosis not present

## 2014-07-20 DIAGNOSIS — Z79899 Other long term (current) drug therapy: Secondary | ICD-10-CM

## 2014-07-20 DIAGNOSIS — Z1331 Encounter for screening for depression: Secondary | ICD-10-CM

## 2014-07-20 DIAGNOSIS — D126 Benign neoplasm of colon, unspecified: Secondary | ICD-10-CM

## 2014-07-20 DIAGNOSIS — H409 Unspecified glaucoma: Secondary | ICD-10-CM

## 2014-07-20 LAB — CBC WITH DIFFERENTIAL/PLATELET
Basophils Absolute: 0 10*3/uL (ref 0.0–0.1)
Basophils Relative: 0 % (ref 0–1)
EOS ABS: 0.2 10*3/uL (ref 0.0–0.7)
EOS PCT: 5 % (ref 0–5)
HCT: 35.5 % — ABNORMAL LOW (ref 36.0–46.0)
Hemoglobin: 11.5 g/dL — ABNORMAL LOW (ref 12.0–15.0)
LYMPHS ABS: 1.3 10*3/uL (ref 0.7–4.0)
LYMPHS PCT: 43 % (ref 12–46)
MCH: 29.3 pg (ref 26.0–34.0)
MCHC: 32.4 g/dL (ref 30.0–36.0)
MCV: 90.6 fL (ref 78.0–100.0)
MONO ABS: 0.2 10*3/uL (ref 0.1–1.0)
MPV: 9.6 fL (ref 8.6–12.4)
Monocytes Relative: 5 % (ref 3–12)
Neutro Abs: 1.5 10*3/uL — ABNORMAL LOW (ref 1.7–7.7)
Neutrophils Relative %: 47 % (ref 43–77)
Platelets: 262 10*3/uL (ref 150–400)
RBC: 3.92 MIL/uL (ref 3.87–5.11)
RDW: 13.7 % (ref 11.5–15.5)
WBC: 3.1 10*3/uL — ABNORMAL LOW (ref 4.0–10.5)

## 2014-07-20 LAB — HEMOGLOBIN A1C
Hgb A1c MFr Bld: 5.9 % — ABNORMAL HIGH (ref <5.7)
Mean Plasma Glucose: 123 mg/dL — ABNORMAL HIGH (ref <117)

## 2014-07-20 NOTE — Patient Instructions (Signed)
Management of Memory Problems  There are some general things you can do to help manage your memory problems.  Your memory may not in fact recover, but by using techniques and strategies you will be able to manage your memory difficulties better.  1)  Establish a routine.  Try to establish and then stick to a regular routine.  By doing this, you will get used to what to expect and you will reduce the need to rely on your memory.  Also, try to do things at the same time of day, such as taking your medication or checking your calendar first thing in the morning.  Think about think that you can do as a part of a regular routine and make a list.  Then enter them into a daily planner to remind you.  This will help you establish a routine.  2)  Organize your environment.  Organize your environment so that it is uncluttered.  Decrease visual stimulation.  Place everyday items such as keys or cell phone in the same place every day (ie.  Basket next to front door)  Use post it notes with a brief message to yourself (ie. Turn off light, lock the door)  Use labels to indicate where things go (ie. Which cupboards are for food, dishes, etc.)  Keep a notepad and pen by the telephone to take messages  3)  Memory Aids  A diary or journal/notebook/daily planner  Making a list (shopping list, chore list, to do list that needs to be done)  Using an alarm as a reminder (kitchen timer or cell phone alarm)  Using cell phone to store information (Notes, Calendar, Reminders)  Calendar/White board placed in a prominent position  Post-it notes  In order for memory aids to be useful, you need to have good habits.  It's no good remembering to make a note in your journal if you don't remember to look in it.  Try setting aside a certain time of day to look in journal.  4)  Improving mood and managing fatigue.  There may be other factors that contribute to memory difficulties.  Factors, such as anxiety,  depression and tiredness can affect memory.  Regular gentle exercise can help improve your mood and give you more energy.  Simple relaxation techniques may help relieve symptoms of anxiety  Try to get back to completing activities or hobbies you enjoyed doing in the past.  Learn to pace yourself through activities to decrease fatigue.  Find out about some local support groups where you can share experiences with others.  Try and achieve 7-8 hours of sleep at night.  Preventive Care for Adults A healthy lifestyle and preventive care can promote health and wellness. Preventive health guidelines for women include the following key practices.  A routine yearly physical is a good way to check with your health care provider about your health and preventive screening. It is a chance to share any concerns and updates on your health and to receive a thorough exam.  Visit your dentist for a routine exam and preventive care every 6 months. Brush your teeth twice a day and floss once a day. Good oral hygiene prevents tooth decay and gum disease.  The frequency of eye exams is based on your age, health, family medical history, use of contact lenses, and other factors. Follow your health care provider's recommendations for frequency of eye exams.  Eat a healthy diet. Foods like vegetables, fruits, whole grains, low-fat dairy products, and lean  protein foods contain the nutrients you need without too many calories. Decrease your intake of foods high in solid fats, added sugars, and salt. Eat the right amount of calories for you.Get information about a proper diet from your health care provider, if necessary.  Regular physical exercise is one of the most important things you can do for your health. Most adults should get at least 150 minutes of moderate-intensity exercise (any activity that increases your heart rate and causes you to sweat) each week. In addition, most adults need muscle-strengthening  exercises on 2 or more days a week.  Maintain a healthy weight. The body mass index (BMI) is a screening tool to identify possible weight problems. It provides an estimate of body fat based on height and weight. Your health care provider can find your BMI and can help you achieve or maintain a healthy weight.For adults 20 years and older:  A BMI below 18.5 is considered underweight.  A BMI of 18.5 to 24.9 is normal.  A BMI of 25 to 29.9 is considered overweight.  A BMI of 30 and above is considered obese.  Maintain normal blood lipids and cholesterol levels by exercising and minimizing your intake of saturated fat. Eat a balanced diet with plenty of fruit and vegetables. If your lipid or cholesterol levels are high, you are over 50, or you are at high risk for heart disease, you may need your cholesterol levels checked more frequently.Ongoing high lipid and cholesterol levels should be treated with medicines if diet and exercise are not working.  If you smoke, find out from your health care provider how to quit. If you do not use tobacco, do not start.  Lung cancer screening is recommended for adults aged 56-80 years who are at high risk for developing lung cancer because of a history of smoking. A yearly low-dose CT scan of the lungs is recommended for people who have at least a 30-pack-year history of smoking and are a current smoker or have quit within the past 15 years. A pack year of smoking is smoking an average of 1 pack of cigarettes a day for 1 year (for example: 1 pack a day for 30 years or 2 packs a day for 15 years). Yearly screening should continue until the smoker has stopped smoking for at least 15 years. Yearly screening should be stopped for people who develop a health problem that would prevent them from having lung cancer treatment.  Avoid use of street drugs. Do not share needles with anyone. Ask for help if you need support or instructions about stopping the use of  drugs.  High blood pressure causes heart disease and increases the risk of stroke.  Ongoing high blood pressure should be treated with medicines if weight loss and exercise do not work.  If you are 59-58 years old, ask your health care provider if you should take aspirin to prevent strokes.  Diabetes screening involves taking a blood sample to check your fasting blood sugar level. This should be done once every 3 years, after age 73, if you are within normal weight and without risk factors for diabetes. Testing should be considered at a younger age or be carried out more frequently if you are overweight and have at least 1 risk factor for diabetes.  Breast cancer screening is essential preventive care for women. You should practice "breast self-awareness." This means understanding the normal appearance and feel of your breasts and may include breast self-examination. Any changes detected, no  matter how small, should be reported to a health care provider. Women in their 71s and 30s should have a clinical breast exam (CBE) by a health care provider as part of a regular health exam every 1 to 3 years. After age 64, women should have a CBE every year. Starting at age 59, women should consider having a mammogram (breast X-ray test) every year. Women who have a family history of breast cancer should talk to their health care provider about genetic screening. Women at a high risk of breast cancer should talk to their health care providers about having an MRI and a mammogram every year.  Breast cancer gene (BRCA)-related cancer risk assessment is recommended for women who have family members with BRCA-related cancers. BRCA-related cancers include breast, ovarian, tubal, and peritoneal cancers. Having family members with these cancers may be associated with an increased risk for harmful changes (mutations) in the breast cancer genes BRCA1 and BRCA2. Results of the assessment will determine the need for genetic  counseling and BRCA1 and BRCA2 testing.  Routine pelvic exams to screen for cancer are no longer recommended for nonpregnant women who are considered low risk for cancer of the pelvic organs (ovaries, uterus, and vagina) and who do not have symptoms. Ask your health care provider if a screening pelvic exam is right for you.  If you have had past treatment for cervical cancer or a condition that could lead to cancer, you need Pap tests and screening for cancer for at least 20 years after your treatment. If Pap tests have been discontinued, your risk factors (such as having a new sexual partner) need to be reassessed to determine if screening should be resumed. Some women have medical problems that increase the chance of getting cervical cancer. In these cases, your health care provider may recommend more frequent screening and Pap tests.    Colorectal cancer can be detected and often prevented. Most routine colorectal cancer screening begins at the age of 92 years and continues through age 23 years. However, your health care provider may recommend screening at an earlier age if you have risk factors for colon cancer. On a yearly basis, your health care provider may provide home test kits to check for hidden blood in the stool. Use of a small camera at the end of a tube, to directly examine the colon (sigmoidoscopy or colonoscopy), can detect the earliest forms of colorectal cancer. Talk to your health care provider about this at age 1, when routine screening begins. Direct exam of the colon should be repeated every 5-10 years through age 54 years, unless early forms of pre-cancerous polyps or small growths are found.  Osteoporosis is a disease in which the bones lose minerals and strength with aging. This can result in serious bone fractures or breaks. The risk of osteoporosis can be identified using a bone density scan. Women ages 67 years and over and women at risk for fractures or osteoporosis should  discuss screening with their health care providers. Ask your health care provider whether you should take a calcium supplement or vitamin D to reduce the rate of osteoporosis.  Menopause can be associated with physical symptoms and risks. Hormone replacement therapy is available to decrease symptoms and risks. You should talk to your health care provider about whether hormone replacement therapy is right for you.  Use sunscreen. Apply sunscreen liberally and repeatedly throughout the day. You should seek shade when your shadow is shorter than you. Protect yourself by wearing long  sleeves, pants, a wide-brimmed hat, and sunglasses year round, whenever you are outdoors.  Once a month, do a whole body skin exam, using a mirror to look at the skin on your back. Tell your health care provider of new moles, moles that have irregular borders, moles that are larger than a pencil eraser, or moles that have changed in shape or color.  Stay current with required vaccines (immunizations).  Influenza vaccine. All adults should be immunized every year.  Tetanus, diphtheria, and acellular pertussis (Td, Tdap) vaccine. Pregnant women should receive 1 dose of Tdap vaccine during each pregnancy. The dose should be obtained regardless of the length of time since the last dose. Immunization is preferred during the 27th-36th week of gestation. An adult who has not previously received Tdap or who does not know her vaccine status should receive 1 dose of Tdap. This initial dose should be followed by tetanus and diphtheria toxoids (Td) booster doses every 10 years. Adults with an unknown or incomplete history of completing a 3-dose immunization series with Td-containing vaccines should begin or complete a primary immunization series including a Tdap dose. Adults should receive a Td booster every 10 years.    Zoster vaccine. One dose is recommended for adults aged 49 years or older unless certain conditions are  present.    Pneumococcal 13-valent conjugate (PCV13) vaccine. When indicated, a person who is uncertain of her immunization history and has no record of immunization should receive the PCV13 vaccine. An adult aged 71 years or older who has certain medical conditions and has not been previously immunized should receive 1 dose of PCV13 vaccine. This PCV13 should be followed with a dose of pneumococcal polysaccharide (PPSV23) vaccine. The PPSV23 vaccine dose should be obtained at least 8 weeks after the dose of PCV13 vaccine. An adult aged 30 years or older who has certain medical conditions and previously received 1 or more doses of PPSV23 vaccine should receive 1 dose of PCV13. The PCV13 vaccine dose should be obtained 1 or more years after the last PPSV23 vaccine dose.    Pneumococcal polysaccharide (PPSV23) vaccine. When PCV13 is also indicated, PCV13 should be obtained first. All adults aged 61 years and older should be immunized. An adult younger than age 70 years who has certain medical conditions should be immunized. Any person who resides in a nursing home or long-term care facility should be immunized. An adult smoker should be immunized. People with an immunocompromised condition and certain other conditions should receive both PCV13 and PPSV23 vaccines. People with human immunodeficiency virus (HIV) infection should be immunized as soon as possible after diagnosis. Immunization during chemotherapy or radiation therapy should be avoided. Routine use of PPSV23 vaccine is not recommended for American Indians, [place] Natives, or people younger than 65 years unless there are medical conditions that require PPSV23 vaccine. When indicated, people who have unknown immunization and have no record of immunization should receive PPSV23 vaccine. One-time revaccination 5 years after the first dose of PPSV23 is recommended for people aged 19-64 years who have chronic kidney failure, nephrotic syndrome, asplenia,  or immunocompromised conditions. People who received 1-2 doses of PPSV23 before age 41 years should receive another dose of PPSV23 vaccine at age 68 years or later if at least 5 years have passed since the previous dose. Doses of PPSV23 are not needed for people immunized with PPSV23 at or after age 32 years.   Preventive Services / Frequency  Ages 70 years and over 2. Blood pressure check.  3. Lipid and cholesterol check. 4. Lung cancer screening. / Every year if you are aged 78-80 years and have a 30-pack-year history of smoking and currently smoke or have quit within the past 15 years. Yearly screening is stopped once you have quit smoking for at least 15 years or develop a health problem that would prevent you from having lung cancer treatment. 5. Clinical breast exam.** / Every year after age 82 years. 6. BRCA-related cancer risk assessment.** / For women who have family members with a BRCA-related cancer (breast, ovarian, tubal, or peritoneal cancers). 7. Mammogram.** / Every year beginning at age 63 years and continuing for as long as you are in good health. Consult with your health care provider. 8. Pap test.** / Every 3 years starting at age 60 years through age 78 or 37 years with 3 consecutive normal Pap tests. Testing can be stopped between 65 and 70 years with 3 consecutive normal Pap tests and no abnormal Pap or HPV tests in the past 10 years. 9. Fecal occult blood test (FOBT) of stool. / Every year beginning at age 9 years and continuing until age 73 years. You may not need to do this test if you get a colonoscopy every 10 years. 10. Flexible sigmoidoscopy or colonoscopy.** / Every 5 years for a flexible sigmoidoscopy or every 10 years for a colonoscopy beginning at age 19 years and continuing until age 29 years. 11. Hepatitis C blood test.** / For all people born from [birth year] through 1965 and any individual with known risks for hepatitis C. 12. Osteoporosis screening.** / A one-time  screening for women ages 20 years and over and women at risk for fractures or osteoporosis. 13. Skin self-exam. / Monthly. 14. Influenza vaccine. / Every year. 15. Tetanus, diphtheria, and acellular pertussis (Tdap/Td) vaccine.** / 1 dose of Td every 10 years. 16. Zoster vaccine.** / 1 dose for adults aged 36 years or older. 17. Pneumococcal 13-valent conjugate (PCV13) vaccine.** / Consult your health care provider. 18. Pneumococcal polysaccharide (PPSV23) vaccine.** / 1 dose for all adults aged 45 years and older. Screening for abdominal aortic aneurysm (AAA)  by ultrasound is recommended for people who have history of high blood pressure or who are current or former smokers.

## 2014-07-20 NOTE — Progress Notes (Signed)
MEDICARE ANNUAL WELLNESS VISIT AND CPE  Assessment:   1. Essential hypertension - continue medications, DASH diet, exercise and monitor at home. Call if greater than 130/80.  - CBC with Differential/Platelet - BASIC METABOLIC PANEL WITH GFR - Hepatic function panel - Urinalysis, Routine w reflex microscopic (not at [facility]) - Microalbumin / creatinine urine ratio - EKG 12-Lead  2. Prediabetes Discussed general issues about diabetes pathophysiology and management., Educational material distributed., Suggested low cholesterol diet., Encouraged aerobic exercise., Discussed foot care., Reminded to get yearly retinal exam. - Hemoglobin A1c - Insulin, fasting - HM DIABETES FOOT EXAM  3. Hypothyroidism, unspecified hypothyroidism type - TSH  4. Glaucoma Continue to follow up with eye doctor  5. Vitamin D deficiency - Vit D  25 hydroxy (rtn osteoporosis monitoring)  6. Medication management - Magnesium  7. Hyperlipidemia -check lipids, decrease fatty foods, increase activity.  - Lipid panel  8. Gastroesophageal reflux disease with esophagitis Continue PPI/H2 blocker, diet discussed  9. Gastrointestinal ulcer due to Helicobacter pylori Treated, better  10. Benign neoplasm of colon Colonoscopy due 2019 - POC Hemoccult Bld/Stl (3-Cd Home Screen); Future  11. Screening for depression negative  12. Routine general medical examination at a health care facility Get MGM, fill out advance directives  13. Patient had no falls in past year Low risk  14. Anemia, unspecified anemia type - Iron and TIBC - Ferritin  15. Poor short term memory Did well on MMSE, better than year before, normal neuro, will monitor, still think there is a possible element of depression but patient declines meds   Plan:   During the course of the visit the patient was educated and counseled about appropriate screening and preventive services including:    Pneumococcal vaccine   Influenza  vaccine  Td vaccine  Screening electrocardiogram  Screening mammography  Bone densitometry screening  Colorectal cancer screening  Diabetes screening  Glaucoma screening  Nutrition counseling   Advanced directives: given information/requested  Conditions/risks identified: BMI: Discussed weight loss, diet, and increase physical activity.  Increase physical activity: AHA recommends 150 minutes of physical activity a week.  Medications reviewed DEXA- declined Urinary Incontinence is not an issue, can rarely : discussed non pharmacology and pharmacology options.  Fall risk: low- discussed PT, home fall assessment, medications.   Subjective:   Robin Zimmerman is a 74 y.o. female who presents for Medicare Annual Wellness Visit and complete physical.    Date of last medicare wellness visit was 07/06/2013  Her blood pressure has been controlled at home, today their BP is BP: 110/78 mmHg She does not workout, very sporadic.  She denies chest pain, shortness of breath, dizziness.  She is not on cholesterol medication and denies myalgias. Her cholesterol is at goal. The cholesterol last visit was:   Lab Results  Component Value Date   CHOL 220* 01/05/2014   HDL 81 01/05/2014   LDLCALC 124* 01/05/2014   TRIG 76 01/05/2014   CHOLHDL 2.7 01/05/2014   She has been working on diet and exercise for prediabetes, and denies polydipsia, polyuria and visual disturbances. Last A1C in the office was:  Lab Results  Component Value Date   HGBA1C 5.9* 01/05/2014   Patient is on Vitamin D supplement, 5000 IU daily. Lab Results  Component Value Date   VD25OH 53 01/05/2014   Went to to grandson's wedding in [place], stayed for a month.  She is not on a thyroid medications.  Lab Results  Component Value Date   TSH 0.839  01/05/2014   She declines depression, has a difficult time remembering names of people.   Names of Other Physician/Practitioners you currently use: 1. [facility]- here for primary care 2. Dr. Ian Malkin eye doctor, last visit March 2016, and again in Oct.  Patient Care Team: Unk Pinto, MD as PCP - General (Internal Medicine) Juanita Craver, MD as Consulting Physician (Gastroenterology)   Medication Review Current Outpatient Prescriptions on File Prior to Visit  Medication Sig Dispense Refill  . Cholecalciferol (VITAMIN D PO) Take 5,000 Int'l Units by mouth daily.    . IRON PO Take by mouth daily.    Marland Kitchen levobunolol (BETAGAN) 0.5 % ophthalmic solution 1 drop daily. In AM    . loratadine (CLARITIN) 10 MG tablet Take 10 mg by mouth daily.    . Travoprost, BAK Free, (TRAVATAN Z) 0.004 % SOLN ophthalmic solution 1 drop at bedtime.     No current facility-administered medications on file prior to visit.    Current Problems (verified) Patient Active Problem List   Diagnosis Date Noted  . Absolute anemia 07/20/2014  . Glaucoma 04/14/2014  . Hypothyroidism 01/05/2014  . Vitamin D deficiency 04/04/2013  . Medication management 04/04/2013  . Prediabetes 01/19/2013  . GERD (gastroesophageal reflux disease)   . Hyperlipidemia   . Hypertension   . Gastrointestinal ulcer due to Helicobacter pylori 90/24/0973  . Benign neoplasm of colon 02/16/2007    Screening Tests Health Maintenance  Topic Date Due  . ZOSTAVAX  10/17/2000  . PNA vac Low Risk Adult (1 of 2 - PCV13) 10/17/2005  . INFLUENZA VACCINE  08/15/2014  . MAMMOGRAM  08/05/2015  . TETANUS/TDAP  07/01/2022  . COLONOSCOPY  09/24/2022  . DEXA SCAN  Completed    Immunization History  Administered Date(s) Administered  . Td 06/30/2012    Preventative care: Last colonoscopy: 2014 due 2019 Last mammogram: 07/2013, [place] of left breast Last pap smear/pelvic exam: 2012   DEXA: 2013 DUE but declines  Prior vaccinations: TD or Tdap: 2014 Influenza: declines Prevnar 13 Declines Pneumococcal: declines Shingles/Zostavax: declines  Allergies No Known  Allergies Surgical history Past Surgical History  Procedure Laterality Date  . Tubal ligation Right   . Cholecystectomy     Family history Family History  Problem Relation Age of Onset  . Heart disease Mother   . Alzheimer's disease Father    Tobacco History  Substance Use Topics  . Smoking status: Never Smoker   . Smokeless tobacco: Never Used  . Alcohol Use: No   MEDICARE WELLNESS OBJECTIVES: Tobacco use: She does not smoke.  Patient is not a former smoker. Alcohol Current alcohol use: none Caffeine Current caffeine use: coffee 1 /day Osteoporosis: postmenopausal estrogen deficiency and dietary calcium and/or vitamin D deficiency, History of fracture in the past year: no Diet: in general, a "healthy" diet   Physical activity: no regular exercise Depression/mood screen:   Depression screen [facility] 2/9 07/20/2014  Decreased Interest 0  Down, Depressed, Hopeless 0  PHQ - 2 Score 0   Hearing: normal Visual acuity: impaired,  does perform annual eye exam  ADLs:  In your present state of health, do you have any difficulty performing the following activities: 07/20/2014  Hearing? N  Vision? Y  Difficulty concentrating or making decisions? Y  Walking or climbing stairs? N  Dressing or bathing? N  Doing errands, shopping? N  Preparing Food and eating ? N  Using the Toilet? N  In the past six months, have you  accidently leaked urine? N  Do you have problems with loss of bowel control? N  Managing your Medications? N  Managing your Finances? N  Housekeeping or managing your Housekeeping? N    Fall risk: Low Risk Cognitive Testing  Alert? Yes  Normal Appearance?Yes  Oriented to person? Yes  Place? Yes   Time? Yes  Recall of three objects?  Yes  Can perform simple calculations? Yes  Displays appropriate judgment?Yes  Can read the correct time from a watch face?Yes  EOL planning: Does patient have an advance directive?: No Would patient like information on creating an  advanced directive?: Yes - Educational materials given    Objective:     Blood pressure 110/78, pulse 64, temperature 97.7 F (36.5 C), resp. rate 16, height 5' 4.5" (1.638 m), weight 138 lb (62.596 kg). Body mass index is 23.33 kg/(m^2).  General appearance: alert, no distress, WD/WN,  female HEENT: normocephalic, sclerae anicteric, TMs pearly, nares patent, no discharge or erythema, pharynx normal Oral cavity: MMM, no lesions Neck: supple, no lymphadenopathy, no thyromegaly, no masses Heart: RRR, normal S1, S2, no murmurs Lungs: CTA bilaterally, no wheezes, rhonchi, or rales Abdomen: +bs, soft, non tender, non distended, no masses, no hepatomegaly, no splenomegaly Musculoskeletal: nontender, no swelling, no obvious deformity Extremities: no edema, no cyanosis, no clubbing Skin: Seb keratosis over back and AB Pulses: 2+ symmetric, upper and lower extremities, normal cap refill Neurological: alert, oriented x 3, CN2-12 intact, strength normal upper extremities and lower extremities, sensation normal throughout, DTRs 2+ throughout, no cerebellar signs, gait normal Psychiatric: normal affect, behavior normal, pleasant  Breast:  nontender, with several mobile round lumps, no skin changes, no nipple discharge or inversion, no axillary lymphadenopathy Gyn: defer  Rectal: defer  EKG: sinus brady, no ST changes  Medicare Attestation I have personally reviewed: The patient's medical and social history Their use of alcohol, tobacco or illicit drugs Their current medications and supplements The patient's functional ability including ADLs,fall risks, home safety risks, cognitive, and hearing and visual impairment Diet and physical activities Evidence for depression or mood disorders  The patient's weight, height, BMI, and visual acuity have been recorded in the chart.  I have made referrals, counseling, and provided education to the patient based on review of the above and I have provided  the patient with a written personalized care plan for preventive services.     Vicie Mutters, PA-C   07/20/2014

## 2014-07-21 LAB — URINALYSIS, ROUTINE W REFLEX MICROSCOPIC
Bilirubin Urine: NEGATIVE
GLUCOSE, UA: NEGATIVE mg/dL
Hgb urine dipstick: NEGATIVE
Ketones, ur: NEGATIVE mg/dL
LEUKOCYTES UA: NEGATIVE
NITRITE: NEGATIVE
PH: 7 (ref 5.0–8.0)
PROTEIN: NEGATIVE mg/dL
Specific Gravity, Urine: 1.016 (ref 1.005–1.030)
Urobilinogen, UA: 0.2 mg/dL (ref 0.0–1.0)

## 2014-07-21 LAB — BASIC METABOLIC PANEL WITH GFR
BUN: 14 mg/dL (ref 6–23)
CHLORIDE: 104 meq/L (ref 96–112)
CO2: 30 mEq/L (ref 19–32)
Calcium: 9.3 mg/dL (ref 8.4–10.5)
Creat: 0.91 mg/dL (ref 0.50–1.10)
GFR, EST AFRICAN AMERICAN: 72 mL/min
GFR, EST NON AFRICAN AMERICAN: 63 mL/min
Glucose, Bld: 63 mg/dL — ABNORMAL LOW (ref 70–99)
POTASSIUM: 4.2 meq/L (ref 3.5–5.3)
Sodium: 144 mEq/L (ref 135–145)

## 2014-07-21 LAB — HEPATIC FUNCTION PANEL
ALK PHOS: 59 U/L (ref 39–117)
ALT: 11 U/L (ref 0–35)
AST: 14 U/L (ref 0–37)
Albumin: 4.1 g/dL (ref 3.5–5.2)
BILIRUBIN DIRECT: 0.1 mg/dL (ref 0.0–0.3)
BILIRUBIN INDIRECT: 0.4 mg/dL (ref 0.2–1.2)
TOTAL PROTEIN: 7.1 g/dL (ref 6.0–8.3)
Total Bilirubin: 0.5 mg/dL (ref 0.2–1.2)

## 2014-07-21 LAB — LIPID PANEL
CHOL/HDL RATIO: 2.2 ratio
CHOLESTEROL: 208 mg/dL — AB (ref 0–200)
HDL: 94 mg/dL (ref >=46)
LDL Cholesterol: 103 mg/dL — ABNORMAL HIGH (ref 0–99)
Triglycerides: 56 mg/dL (ref <150)
VLDL: 11 mg/dL (ref 0–40)

## 2014-07-21 LAB — INSULIN, FASTING: Insulin fasting, serum: 8.8 u[IU]/mL (ref 2.0–19.6)

## 2014-07-21 LAB — MICROALBUMIN / CREATININE URINE RATIO
CREATININE, URINE: 104.9 mg/dL
MICROALB/CREAT RATIO: 2.9 mg/g (ref 0.0–30.0)
Microalb, Ur: 0.3 mg/dL (ref <2.0)

## 2014-07-21 LAB — IRON AND TIBC
%SAT: 28 % (ref 20–55)
Iron: 76 ug/dL (ref 42–145)
TIBC: 273 ug/dL (ref 250–470)
UIBC: 197 ug/dL (ref 125–400)

## 2014-07-21 LAB — FERRITIN: Ferritin: 164 ng/mL (ref 10–291)

## 2014-07-21 LAB — MAGNESIUM: Magnesium: 2.1 mg/dL (ref 1.5–2.5)

## 2014-07-21 LAB — VITAMIN D 25 HYDROXY (VIT D DEFICIENCY, FRACTURES): Vit D, 25-Hydroxy: 34 ng/mL (ref 30–100)

## 2014-07-21 LAB — TSH: TSH: 0.596 u[IU]/mL (ref 0.350–4.500)

## 2014-08-23 ENCOUNTER — Other Ambulatory Visit: Payer: Self-pay | Admitting: *Deleted

## 2014-08-23 DIAGNOSIS — D126 Benign neoplasm of colon, unspecified: Secondary | ICD-10-CM

## 2014-08-23 LAB — POC HEMOCCULT BLD/STL (HOME/3-CARD/SCREEN)
Card #2 Fecal Occult Blod, POC: NEGATIVE
FECAL OCCULT BLD: NEGATIVE
Fecal Occult Blood, POC: NEGATIVE

## 2014-09-15 ENCOUNTER — Ambulatory Visit (INDEPENDENT_AMBULATORY_CARE_PROVIDER_SITE_OTHER): Payer: Commercial Managed Care - HMO | Admitting: Internal Medicine

## 2014-09-15 ENCOUNTER — Encounter: Payer: Self-pay | Admitting: Internal Medicine

## 2014-09-15 VITALS — BP 138/66 | HR 80 | Temp 98.0°F | Resp 18 | Ht 64.5 in | Wt 138.0 lb

## 2014-09-15 DIAGNOSIS — J309 Allergic rhinitis, unspecified: Secondary | ICD-10-CM

## 2014-09-15 NOTE — Patient Instructions (Signed)

## 2014-09-15 NOTE — Progress Notes (Signed)
   Subjective:    Patient ID: Robin Zimmerman, female    DOB: 03/18/1940, 74 y.o.   MRN: [id number]  Sinus Problem Associated symptoms include congestion, ear pain (right ear), sinus pressure and sneezing. Pertinent negatives include no chills, coughing, shortness of breath or sore throat.  Patient presents to the office for evaluation of sinus congestion, fatigue, rhinorrhea, and clogged ears.  She reports that she has several neighbors that are doing some work and also she has been around some seeds lately.  She reports that she normally has bad seasonal allergies.  She has been taking claritin daily.      Review of Systems  Constitutional: Positive for fatigue. Negative for fever and chills.  HENT: Positive for congestion, ear pain (right ear), postnasal drip, rhinorrhea, sinus pressure, sneezing and voice change. Negative for nosebleeds, sore throat and trouble swallowing.   Respiratory: Negative for cough, chest tightness, shortness of breath and wheezing.        Objective:   Physical Exam  Constitutional: She is oriented to person, place, and time. She appears well-developed and well-nourished. No distress.  HENT:  Head: Normocephalic.  Nose: Mucosal edema present.  Mouth/Throat: Uvula is midline, oropharynx is clear and moist and mucous membranes are normal. No trismus in the jaw.  Eyes: Conjunctivae are normal. No scleral icterus.  Neck: Normal range of motion. Neck supple. No JVD present. No thyromegaly present.  Cardiovascular: Normal rate, regular rhythm, normal heart sounds and intact distal pulses.  Exam reveals no gallop and no friction rub.   No murmur heard. Pulmonary/Chest: Effort normal and breath sounds normal. No respiratory distress. She has no wheezes. She has no rales. She exhibits no tenderness.  Musculoskeletal: Normal range of motion.  Lymphadenopathy:    She has no cervical adenopathy.  Neurological: She is alert and oriented to person, place, and time.   Skin: Skin is warm and dry. She is not diaphoretic.  Psychiatric: She has a normal mood and affect. Her behavior is normal. Judgment and thought content normal.  Nursing note and vitals reviewed.   Filed Vitals:   09/15/14 1402  BP: 138/66  Pulse: 80  Temp: 98 F (36.7 C)  Resp: 18          Assessment & Plan:    1. Allergic rhinitis, unspecified allergic rhinitis type -offered prednisone or decadron shot which patient declined -dymista spray samples given -nasal saline -cont claritin

## 2014-11-02 ENCOUNTER — Ambulatory Visit: Payer: Self-pay | Admitting: Internal Medicine

## 2014-11-08 DIAGNOSIS — H40003 Preglaucoma, unspecified, bilateral: Secondary | ICD-10-CM | POA: Diagnosis not present

## 2014-12-20 DIAGNOSIS — H5213 Myopia, bilateral: Secondary | ICD-10-CM | POA: Diagnosis not present

## 2014-12-20 DIAGNOSIS — H40003 Preglaucoma, unspecified, bilateral: Secondary | ICD-10-CM | POA: Diagnosis not present

## 2015-01-26 ENCOUNTER — Ambulatory Visit (INDEPENDENT_AMBULATORY_CARE_PROVIDER_SITE_OTHER): Payer: Commercial Managed Care - HMO | Admitting: Internal Medicine

## 2015-01-26 ENCOUNTER — Encounter: Payer: Self-pay | Admitting: Internal Medicine

## 2015-01-26 VITALS — BP 118/76 | HR 80 | Temp 97.3°F | Resp 16 | Ht 64.5 in | Wt 141.7 lb

## 2015-01-26 DIAGNOSIS — R102 Pelvic and perineal pain: Secondary | ICD-10-CM | POA: Diagnosis not present

## 2015-01-26 DIAGNOSIS — K219 Gastro-esophageal reflux disease without esophagitis: Secondary | ICD-10-CM

## 2015-01-26 DIAGNOSIS — E559 Vitamin D deficiency, unspecified: Secondary | ICD-10-CM

## 2015-01-26 DIAGNOSIS — Z79899 Other long term (current) drug therapy: Secondary | ICD-10-CM

## 2015-01-26 DIAGNOSIS — N949 Unspecified condition associated with female genital organs and menstrual cycle: Secondary | ICD-10-CM | POA: Diagnosis not present

## 2015-01-26 DIAGNOSIS — R7303 Prediabetes: Secondary | ICD-10-CM

## 2015-01-26 DIAGNOSIS — I10 Essential (primary) hypertension: Secondary | ICD-10-CM

## 2015-01-26 DIAGNOSIS — M542 Cervicalgia: Secondary | ICD-10-CM

## 2015-01-26 DIAGNOSIS — E785 Hyperlipidemia, unspecified: Secondary | ICD-10-CM

## 2015-01-26 DIAGNOSIS — E039 Hypothyroidism, unspecified: Secondary | ICD-10-CM | POA: Diagnosis not present

## 2015-01-26 DIAGNOSIS — R7309 Other abnormal glucose: Secondary | ICD-10-CM | POA: Diagnosis not present

## 2015-01-26 LAB — LIPID PANEL
CHOLESTEROL: 194 mg/dL (ref 125–200)
HDL: 93 mg/dL (ref >=46)
LDL Cholesterol: 91 mg/dL (ref <130)
Total CHOL/HDL Ratio: 2.1 Ratio (ref <=5.0)
Triglycerides: 49 mg/dL (ref <150)
VLDL: 10 mg/dL (ref <30)

## 2015-01-26 LAB — CBC WITH DIFFERENTIAL/PLATELET
BASOS PCT: 0 % (ref 0–1)
Basophils Absolute: 0 10*3/uL (ref 0.0–0.1)
Eosinophils Absolute: 0.1 10*3/uL (ref 0.0–0.7)
Eosinophils Relative: 1 % (ref 0–5)
HCT: 34 % — ABNORMAL LOW (ref 36.0–46.0)
Hemoglobin: 11 g/dL — ABNORMAL LOW (ref 12.0–15.0)
Lymphocytes Relative: 19 % (ref 12–46)
Lymphs Abs: 1 10*3/uL (ref 0.7–4.0)
MCH: 29.6 pg (ref 26.0–34.0)
MCHC: 32.4 g/dL (ref 30.0–36.0)
MCV: 91.6 fL (ref 78.0–100.0)
MONO ABS: 0.4 10*3/uL (ref 0.1–1.0)
MONOS PCT: 7 % (ref 3–12)
MPV: 9.3 fL (ref 8.6–12.4)
Neutro Abs: 3.7 10*3/uL (ref 1.7–7.7)
Neutrophils Relative %: 73 % (ref 43–77)
Platelets: 242 10*3/uL (ref 150–400)
RBC: 3.71 MIL/uL — AB (ref 3.87–5.11)
RDW: 13.1 % (ref 11.5–15.5)
WBC: 5.1 10*3/uL (ref 4.0–10.5)

## 2015-01-26 LAB — BASIC METABOLIC PANEL WITH GFR
BUN: 12 mg/dL (ref 7–25)
CO2: 29 mmol/L (ref 20–31)
Calcium: 9.1 mg/dL (ref 8.6–10.4)
Chloride: 104 mmol/L (ref 98–110)
Creat: 0.92 mg/dL (ref 0.60–0.93)
GFR, Est African American: 71 mL/min (ref >=60)
GFR, Est Non African American: 62 mL/min (ref >=60)
Glucose, Bld: 100 mg/dL — ABNORMAL HIGH (ref 65–99)
Potassium: 3.7 mmol/L (ref 3.5–5.3)
SODIUM: 141 mmol/L (ref 135–146)

## 2015-01-26 LAB — HEPATIC FUNCTION PANEL
ALT: 8 U/L (ref 6–29)
AST: 16 U/L (ref 10–35)
Albumin: 4 g/dL (ref 3.6–5.1)
Alkaline Phosphatase: 57 U/L (ref 33–130)
BILIRUBIN DIRECT: 0.2 mg/dL (ref <=0.2)
BILIRUBIN TOTAL: 0.7 mg/dL (ref 0.2–1.2)
Indirect Bilirubin: 0.5 mg/dL (ref 0.2–1.2)
Total Protein: 6.9 g/dL (ref 6.1–8.1)

## 2015-01-26 LAB — MAGNESIUM: MAGNESIUM: 2 mg/dL (ref 1.5–2.5)

## 2015-01-26 MED ORDER — PREDNISONE 20 MG PO TABS: ORAL_TABLET | ORAL | Status: DC

## 2015-01-26 NOTE — Patient Instructions (Signed)

## 2015-01-26 NOTE — Progress Notes (Signed)
Patient ID: Robin Zimmerman, female   DOB: Feb 22, 1940, 75 y.o.   MRN: [id number]   This very nice 75 y.o. DBF presents for 6 month follow up with labile Hypertension, Hyperlipidemia, Pre-Diabetes and Vitamin D Deficiency. Today she's also c/o pains in the rt neck and also occas vague discomfort in the suprapubic area.    Patient is monitored expectantly for hx/o labile HTN & BP has been controlled at home. Today's BP: 118/76 mmHg. Patient has had no complaints of any cardiac type chest pain, palpitations, dyspnea/orthopnea/PND, dizziness, claudication, or dependent edema.   Hyperlipidemia is controlled with diet & meds. Patient denies myalgias or other med SE's. Last Lipids were  Near goal with Cholesterol 208*; HDL 94; LDL 103*; Triglycerides 56 on 07/20/2014.   Also, the patient has history of PreDiabetes and has had no symptoms of reactive hypoglycemia, diabetic polys, paresthesias or visual blurring.  Last A1c was  5.9% on 07/20/2014.    Further, the patient also has history of Vitamin D Deficiency and sporadically supplements vitamin D. Last vitamin D was 34 on 07/20/2014.  Medication Sig  . CALCIUM PO Take by mouth daily.  Marland Kitchen VITAMIN D  Take 5,000 Int'l Units by mouth daily.  . IRON  Take by mouth daily.  Marland Kitchen loratadine10 MG  Take 10 mg by mouth daily.  . Travoprost, BAK Free, (TRAVATAN Z) 0.004 % SOLN ophthalmic solution 1 drop at bedtime.  Marland Kitchen levobunolol (BETAGAN) 0.5 % ophthalmic solution 1 drop daily. In AM   No Known Allergies  PMHx:   Past Medical History  Diagnosis Date  . Anemia   . GERD (gastroesophageal reflux disease)   . Hyperlipidemia   . Hypertension   . Thyroid disease   . Glaucoma    Immunization History  Administered Date(s) Administered  . Td 06/30/2012   Past Surgical History  Procedure Laterality Date  . Tubal ligation Right   . Cholecystectomy     FHx:    Reviewed / unchanged  SHx:    Reviewed / unchanged  Systems Review:  Constitutional: Denies  fever, chills, wt changes, headaches, insomnia, fatigue, night sweats, change in appetite. Eyes: Denies redness, blurred vision, diplopia, discharge, itchy, watery eyes.  ENT: Denies discharge, congestion, post nasal drip, epistaxis, sore throat, earache, hearing loss, dental pain, tinnitus, vertigo, sinus pain, snoring.  CV: Denies chest pain, palpitations, irregular heartbeat, syncope, dyspnea, diaphoresis, orthopnea, PND, claudication or edema. Respiratory: denies cough, dyspnea, DOE, pleurisy, hoarseness, laryngitis, wheezing.  Gastrointestinal: Denies dysphagia, odynophagia, heartburn, reflux, water brash or cramps, nausea, vomiting, bloating, diarrhea, constipation, hematemesis, melena, hematochezia  or hemorrhoids. Does c/o vague ache in the suprapubic area.  Genitourinary: Denies dysuria, frequency, urgency, nocturia, hesitancy, discharge, hematuria or flank pain. Musculoskeletal: Denies arthralgias, myalgias, stiffness, jt. swelling, pain, limping or strain/sprain.  Skin: Denies pruritus, rash, hives, warts, acne, eczema or change in skin lesion(s). Neuro: No weakness, tremor, incoordination, spasms, paresthesia or pain. Psychiatric: Denies confusion, memory loss or sensory loss. Endo: Denies change in weight, skin or hair change.  Heme/Lymph: No excessive bleeding, bruising or enlarged lymph nodes.  Physical Exam  BP 118/76 mmHg  Pulse 80  Temp(Src) 97.3 F (36.3 C)  Resp 16  Ht 5' 4.5" (1.638 m)  Wt 141 lb 11.2 oz (64.275 kg)  BMI 23.96 kg/m2  Appears well nourished and in no distress. Eyes: PERRLA, EOMs, conjunctiva no swelling or erythema. Sinuses: No frontal/maxillary tenderness ENT/Mouth: EAC's clear, TM's nl w/o erythema, bulging. Nares clear w/o erythema, swelling, exudates. Oropharynx  clear without erythema or exudates. Oral hygiene is good. Tongue normal, non obstructing. Hearing intact.  Neck: Supple., but point tenderness in the R para-cervical muscles. Thyroid nl.  Car 2+/2+ without bruits, nodes or JVD. Chest: Respirations nl with BS clear & equal w/o rales, rhonchi, wheezing or stridor.  Cor: Heart sounds normal w/ regular rate and rhythm without sig. murmurs, gallops, clicks, or rubs. Peripheral pulses normal and equal  without edema.  Abdomen: Soft & bowel sounds normal. Non-tender w/o guarding, rebound, hernias, masses, or organomegaly.  Lymphatics: Unremarkable.  Musculoskeletal: Full ROM all peripheral extremities, joint stability, 5/5 strength, and normal gait.  Skin: Warm, dry without exposed rashes, lesions or ecchymosis apparent.  Neuro: Cranial nerves intact, reflexes equal bilaterally. Sensory-motor testing grossly intact. Tendon reflexes grossly intact.  Pysch: Alert & oriented x 3.  Insight and judgement nl & appropriate. No ideations.  Assessment and Plan:  1. Essential hypertension  - TSH  2. Hyperlipidemia  - Lipid panel - TSH  3. Prediabetes  - Hemoglobin A1c - Insulin, random  4. Vitamin D deficiency  - VITAMIN D 25 Hydroxy   5. Hypothyroidism   6. Gastroesophageal reflux disease   7. Pelvic pain in female  - Urinalysis, Routine w reflex microscopic - Urine culture  8. Cervicalgia  - predniSONE (DELTASONE) 20 MG tablet; 1 tab 3 x day for 3 days, then 1 tab 2 x day for 3 days, then 1 tab 1 x day for 5 days  Dispense: 20 tablet; Refill: 0  - Advised if sx's not resolve to call for Orthopedic referral.   9. Medication management  - CBC with Differential/Platelet - BASIC METABOLIC PANEL WITH GFR - Hepatic function panel - Magnesium   Recommended regular exercise, BP monitoring, weight control, and discussed med and SE's. Recommended labs to assess and monitor clinical status. Further disposition pending results of labs. Over 30 minutes of exam, counseling, chart review was performed

## 2015-01-27 LAB — URINALYSIS, ROUTINE W REFLEX MICROSCOPIC
BILIRUBIN URINE: NEGATIVE
Glucose, UA: NEGATIVE
Ketones, ur: NEGATIVE
LEUKOCYTES UA: NEGATIVE
NITRITE: NEGATIVE
PROTEIN: NEGATIVE
SPECIFIC GRAVITY, URINE: 1.018 (ref 1.001–1.035)
pH: 7 (ref 5.0–8.0)

## 2015-01-27 LAB — URINE CULTURE: Colony Count: 2000

## 2015-01-27 LAB — VITAMIN D 25 HYDROXY (VIT D DEFICIENCY, FRACTURES): VIT D 25 HYDROXY: 31 ng/mL (ref 30–100)

## 2015-01-27 LAB — HEMOGLOBIN A1C
Hgb A1c MFr Bld: 5.9 % — ABNORMAL HIGH (ref <5.7)
Mean Plasma Glucose: 123 mg/dL — ABNORMAL HIGH (ref <117)

## 2015-01-27 LAB — INSULIN, RANDOM: Insulin: 43.3 u[IU]/mL — ABNORMAL HIGH (ref 2.0–19.6)

## 2015-01-27 LAB — URINALYSIS, MICROSCOPIC ONLY
Bacteria, UA: NONE SEEN [HPF]
CRYSTALS: NONE SEEN [HPF]
Casts: NONE SEEN [LPF]
SQUAMOUS EPITHELIAL / LPF: NONE SEEN [HPF] (ref <=5)
WBC UA: NONE SEEN WBC/HPF (ref <=5)
Yeast: NONE SEEN [HPF]

## 2015-01-27 LAB — TSH: TSH: 0.479 u[IU]/mL (ref 0.350–4.500)

## 2015-06-29 DIAGNOSIS — H40003 Preglaucoma, unspecified, bilateral: Secondary | ICD-10-CM | POA: Diagnosis not present

## 2015-07-25 ENCOUNTER — Encounter: Payer: Self-pay | Admitting: Physician Assistant

## 2015-08-30 ENCOUNTER — Encounter: Payer: Self-pay | Admitting: Physician Assistant

## 2015-10-09 ENCOUNTER — Ambulatory Visit (INDEPENDENT_AMBULATORY_CARE_PROVIDER_SITE_OTHER): Payer: Commercial Managed Care - HMO | Admitting: Physician Assistant

## 2015-10-09 ENCOUNTER — Encounter: Payer: Self-pay | Admitting: Physician Assistant

## 2015-10-09 VITALS — BP 128/74 | HR 71 | Temp 97.5°F | Resp 16 | Ht 65.0 in | Wt 140.6 lb

## 2015-10-09 DIAGNOSIS — Z136 Encounter for screening for cardiovascular disorders: Secondary | ICD-10-CM | POA: Diagnosis not present

## 2015-10-09 DIAGNOSIS — K219 Gastro-esophageal reflux disease without esophagitis: Secondary | ICD-10-CM

## 2015-10-09 DIAGNOSIS — R6889 Other general symptoms and signs: Secondary | ICD-10-CM | POA: Diagnosis not present

## 2015-10-09 DIAGNOSIS — E039 Hypothyroidism, unspecified: Secondary | ICD-10-CM | POA: Diagnosis not present

## 2015-10-09 DIAGNOSIS — M25511 Pain in right shoulder: Secondary | ICD-10-CM

## 2015-10-09 DIAGNOSIS — K289 Gastrojejunal ulcer, unspecified as acute or chronic, without hemorrhage or perforation: Secondary | ICD-10-CM

## 2015-10-09 DIAGNOSIS — B9681 Helicobacter pylori [H. pylori] as the cause of diseases classified elsewhere: Secondary | ICD-10-CM

## 2015-10-09 DIAGNOSIS — H409 Unspecified glaucoma: Secondary | ICD-10-CM | POA: Diagnosis not present

## 2015-10-09 DIAGNOSIS — E2839 Other primary ovarian failure: Secondary | ICD-10-CM

## 2015-10-09 DIAGNOSIS — D126 Benign neoplasm of colon, unspecified: Secondary | ICD-10-CM

## 2015-10-09 DIAGNOSIS — E559 Vitamin D deficiency, unspecified: Secondary | ICD-10-CM

## 2015-10-09 DIAGNOSIS — E785 Hyperlipidemia, unspecified: Secondary | ICD-10-CM | POA: Diagnosis not present

## 2015-10-09 DIAGNOSIS — Z0001 Encounter for general adult medical examination with abnormal findings: Secondary | ICD-10-CM

## 2015-10-09 DIAGNOSIS — R7309 Other abnormal glucose: Secondary | ICD-10-CM | POA: Diagnosis not present

## 2015-10-09 DIAGNOSIS — I10 Essential (primary) hypertension: Secondary | ICD-10-CM

## 2015-10-09 DIAGNOSIS — D649 Anemia, unspecified: Secondary | ICD-10-CM

## 2015-10-09 DIAGNOSIS — M25571 Pain in right ankle and joints of right foot: Secondary | ICD-10-CM

## 2015-10-09 DIAGNOSIS — Z79899 Other long term (current) drug therapy: Secondary | ICD-10-CM | POA: Diagnosis not present

## 2015-10-09 LAB — CBC WITH DIFFERENTIAL/PLATELET
BASOS PCT: 0 %
Basophils Absolute: 0 cells/uL (ref 0–200)
EOS ABS: 64 {cells}/uL (ref 15–500)
Eosinophils Relative: 2 %
HEMATOCRIT: 35.1 % (ref 35.0–45.0)
Hemoglobin: 11.2 g/dL — ABNORMAL LOW (ref 11.7–15.5)
Lymphocytes Relative: 41 %
Lymphs Abs: 1312 cells/uL (ref 850–3900)
MCH: 29.1 pg (ref 27.0–33.0)
MCHC: 31.9 g/dL — ABNORMAL LOW (ref 32.0–36.0)
MCV: 91.2 fL (ref 80.0–100.0)
MONO ABS: 192 {cells}/uL — AB (ref 200–950)
MONOS PCT: 6 %
MPV: 9.1 fL (ref 7.5–12.5)
NEUTROS ABS: 1632 {cells}/uL (ref 1500–7800)
Neutrophils Relative %: 51 %
PLATELETS: 247 10*3/uL (ref 140–400)
RBC: 3.85 MIL/uL (ref 3.80–5.10)
RDW: 13.6 % (ref 11.0–15.0)
WBC: 3.2 10*3/uL — AB (ref 3.8–10.8)

## 2015-10-09 LAB — LIPID PANEL
Cholesterol: 213 mg/dL — ABNORMAL HIGH (ref 125–200)
HDL: 102 mg/dL (ref >=46)
LDL CALC: 99 mg/dL (ref <130)
Total CHOL/HDL Ratio: 2.1 Ratio (ref <=5.0)
Triglycerides: 58 mg/dL (ref <150)
VLDL: 12 mg/dL (ref <30)

## 2015-10-09 LAB — BASIC METABOLIC PANEL WITH GFR
BUN: 9 mg/dL (ref 7–25)
CHLORIDE: 103 mmol/L (ref 98–110)
CO2: 25 mmol/L (ref 20–31)
Calcium: 9.2 mg/dL (ref 8.6–10.4)
Creat: 0.93 mg/dL (ref 0.60–0.93)
GFR, EST NON AFRICAN AMERICAN: 61 mL/min (ref >=60)
GFR, Est African American: 70 mL/min (ref >=60)
GLUCOSE: 67 mg/dL (ref 65–99)
POTASSIUM: 3.9 mmol/L (ref 3.5–5.3)
Sodium: 139 mmol/L (ref 135–146)

## 2015-10-09 LAB — HEPATIC FUNCTION PANEL
ALK PHOS: 54 U/L (ref 33–130)
ALT: 8 U/L (ref 6–29)
AST: 15 U/L (ref 10–35)
Albumin: 4.3 g/dL (ref 3.6–5.1)
BILIRUBIN INDIRECT: 0.3 mg/dL (ref 0.2–1.2)
Bilirubin, Direct: 0.1 mg/dL (ref <=0.2)
TOTAL PROTEIN: 7.2 g/dL (ref 6.1–8.1)
Total Bilirubin: 0.4 mg/dL (ref 0.2–1.2)

## 2015-10-09 LAB — TSH: TSH: 0.59 mIU/L

## 2015-10-09 LAB — MAGNESIUM: MAGNESIUM: 2.2 mg/dL (ref 1.5–2.5)

## 2015-10-09 NOTE — Progress Notes (Signed)
CPE AND 3 MONTH FOLLOW UP  Assessment:   Essential hypertension - continue medications, DASH diet, exercise and monitor at home. Call if greater than 130/80.  - CBC with Differential/Platelet - BASIC METABOLIC PANEL WITH GFR - Hepatic function panel - Urinalysis, Routine w reflex microscopic (not at [facility]) - Microalbumin / creatinine urine ratio - EKG 12-Lead   Prediabetes Discussed general issues about diabetes pathophysiology and management., Educational material distributed., Suggested low cholesterol diet., Encouraged aerobic exercise., Discussed foot care., Reminded to get yearly retinal exam. - Hemoglobin A1c  Hypothyroidism, unspecified hypothyroidism type - TSH  Glaucoma Continue to follow up with eye doctor  Vitamin D deficiency - Vit D  25 hydroxy (rtn osteoporosis monitoring)  Medication management - Magnesium   Hyperlipidemia -check lipids, decrease fatty foods, increase activity.  - Lipid panel  Gastroesophageal reflux disease with esophagitis Continue PPI/H2 blocker, diet discussed  Gastrointestinal ulcer due to Helicobacter pylori Treated, better   Benign neoplasm of colon Colonoscopy due 2019   Routine general medical examination at a health care facility [facility], fill out advance directives  Right shoulder Likely subacromial bursitis- ice, exercises given, tylenol PRN If not better we will refer to ortho  Right medial ankle pain with pes planus Get better inserts, stretches given, if not better will refer   Future Appointments Date Time Provider [place]  10/10/2016 9:00 AM Vicie Mutters, PA-C [id number] None     Subjective:   Robin Zimmerman is a 75 y.o. female who presents CPE and 3 month follow up for HTN, chol, preDM.   She also has had right ankle pain x 1 month, no known history of injury. Medial malleolus/achilles tendon discomfort, worse with walking on it, has pes planus. Bought inserts but can only wear with  sneakers. Has not tried any medications for it. No numbness, tingling in foot.   Her blood pressure has been controlled at home, today their BP is BP: 128/74 She does not workout, very sporadic.  She denies chest pain, shortness of breath, dizziness.  She is not on cholesterol medication and denies myalgias. Her cholesterol is at goal. The cholesterol last visit was:   Lab Results  Component Value Date   CHOL 194 01/26/2015   HDL 93 01/26/2015   LDLCALC 91 01/26/2015   TRIG 49 01/26/2015   CHOLHDL 2.1 01/26/2015   She has been working on diet and exercise for prediabetes, and denies polydipsia, polyuria and visual disturbances. Last A1C in the office was:  Lab Results  Component Value Date   HGBA1C 5.9 (H) 01/26/2015   Patient is on Vitamin D supplement, 5000 IU daily. Lab Results  Component Value Date   VD25OH 31 01/26/2015   She is not on a thyroid medications.  Lab Results  Component Value Date   TSH 0.479 01/26/2015    Names of Other Physician/Practitioners you currently use: 1. [facility]- here for primary care 2. Dr. Ian Malkin eye doctor, last visit March 2017, has another appt in Oct, wants to remove cataracts Patient Care Team: Unk Pinto, MD as PCP - General (Internal Medicine) Juanita Craver, MD as Consulting Physician (Gastroenterology)   Medication Review Current Outpatient Prescriptions on File Prior to Visit  Medication Sig Dispense Refill  . CALCIUM PO Take by mouth daily.    . Cholecalciferol (VITAMIN D PO) Take 5,000 Int'l Units by mouth daily.    . IRON PO Take by mouth daily.    Marland Kitchen loratadine (CLARITIN) 10 MG  tablet Take 10 mg by mouth daily.    . Travoprost, BAK Free, (TRAVATAN Z) 0.004 % SOLN ophthalmic solution 1 drop at bedtime.     No current facility-administered medications on file prior to visit.     Current Problems (verified) Patient Active Problem List   Diagnosis Date Noted  . Absolute anemia  07/20/2014  . Glaucoma 04/14/2014  . Hypothyroidism 01/05/2014  . Vitamin D deficiency 04/04/2013  . Medication management 04/04/2013  . Other abnormal glucose 01/19/2013  . GERD (gastroesophageal reflux disease)   . Hyperlipidemia   . Hypertension   . Gastrointestinal ulcer due to Helicobacter pylori [id number]  . Benign neoplasm of colon 02/16/2007    Screening Tests Immunization History  Administered Date(s) Administered  . Td 06/30/2012   Preventative care: Last colonoscopy: 2014 due 2019 Last mammogram: 07/2013, [place] of left breast Last pap smear/pelvic exam: 2012   DEXA: 2013 DUE willing to do WITH MGM  Prior vaccinations: TD or Tdap: 2014 Influenza: declines Prevnar 13 Declines Pneumococcal: declines Shingles/Zostavax: declines  Allergies No Known Allergies  SURGICAL HISTORY She  has a past surgical history that includes Tubal ligation (Right) and Cholecystectomy. FAMILY HISTORY Her family history includes Alzheimer's disease in her father; Heart disease in her mother. SOCIAL HISTORY She  reports that she quit smoking about 41 years ago. She has never used smokeless tobacco. She reports that she does not drink alcohol or use drugs.  Review of Systems  Constitutional: Negative.   HENT: Negative.   Eyes: Negative.   Respiratory: Negative.   Cardiovascular: Negative.   Gastrointestinal: Negative.   Genitourinary: Negative.   Musculoskeletal: Positive for joint pain and myalgias. Negative for back pain, falls and neck pain.  Skin: Negative.   Neurological: Negative.   Endo/Heme/Allergies: Negative.   Psychiatric/Behavioral: Positive for memory loss. Negative for depression, hallucinations, substance abuse and suicidal ideas. The patient is not nervous/anxious and does not have insomnia.     Objective:     Blood pressure 128/74, pulse 71, temperature 97.5 F (36.4 C), resp. rate 16, height 5\' 5"  (1.651 m), weight 140 lb 9.6 oz (63.8 kg), SpO2 98 %. Body  mass index is 23.4 kg/m.  General appearance: alert, no distress, WD/WN,  female HEENT: normocephalic, sclerae anicteric, TMs pearly, nares patent, no discharge or erythema, pharynx normal Oral cavity: MMM, no lesions Neck: supple, no lymphadenopathy, no thyromegaly, no masses Heart: RRR, normal S1, S2, no murmurs Lungs: CTA bilaterally, no wheezes, rhonchi, or rales Abdomen: +bs, soft, non tender, non distended, no masses, no hepatomegaly, no splenomegaly Musculoskeletal: nontender, no swelling, no obvious deformity, + tenderness at subacromial bursitis, + empty can test, otherwise normal right shoulder exam, normal distal neurovascular exam, Right foot with pain at base of medial malleolus, otherwise no laxity, normal exam, FROM no pain.  Extremities: no edema, no cyanosis, no clubbing Skin: Seb keratosis over back and AB Pulses: 2+ symmetric, upper and lower extremities, normal cap refill Neurological: alert, oriented x 3, CN2-12 intact, strength normal upper extremities and lower extremities, sensation normal throughout, DTRs 2+ throughout, no cerebellar signs, gait normal Psychiatric: normal affect, behavior normal, pleasant  Breast:  nontender, with several mobile round lumps, no skin changes, no nipple discharge or inversion, no axillary lymphadenopathy Gyn: defer  Rectal: defer  EKG: sinus brady, no ST changes     Vicie Mutters, PA-C   10/09/2015

## 2015-10-09 NOTE — Patient Instructions (Addendum)
If your shoulder or ankle is not better we will send you to ortho, please call  [facility]  7 a.m.-6:30 p.m., Monday 7 a.m.-5 p.m., Tuesday-Friday Schedule an appointment by calling [phone].  Encourage you to get the 3D Mammogram  The 3D Mammogram is much more specific and sensitive to pick up breast cancer. For women with fibrocystic breast or lumpy breast it can be hard to determine if it is cancer or not but the 3D mammogram is able to tell this difference which cuts back on unneeded additional tests or scary call backs.   - over 40% increase in detection of breast cancer - over 40% reduction in false positives.  - fewer call backs - reduced anxiety - improved outcomes - PEACE OF MIND  Bursitis Bursitis is inflammation and irritation of a bursa, which is one of the small, fluid-filled sacs that cushion and protect the moving parts of your body. These sacs are located between bones and muscles, muscle attachments, or skin areas next to bones. A bursa protects these structures from the wear and tear that results from frequent movement. An inflamed bursa causes pain and swelling. Fluid may build up inside the sac. Bursitis is most common near joints, especially the knees, elbows, hips, and shoulders. CAUSES Bursitis can be caused by:   Injury from:  A direct blow, like falling on your knee or elbow.  Overuse of a joint (repetitive stress).  Infection. This can happen if bacteria gets into a bursa through a cut or scrape near a joint.  Diseases that cause joint inflammation, such as gout and rheumatoid arthritis. RISK FACTORS You may be at risk for bursitis if you:   Have a job or hobby that involves a lot of repetitive stress on your joints.  Have a condition that weakens your body's defense system (immune system), such as diabetes, cancer, or HIV.  Lift and reach overhead often.  Kneel or lean on hard surfaces often.  Run or walk  often. SIGNS AND SYMPTOMS The most common signs and symptoms of bursitis are:  Pain that gets worse when you move the affected body part or put weight on it.  Inflammation.  Stiffness. Other signs and symptoms may include:  Redness.  Tenderness.  Warmth.  Pain that continues after rest.  Fever and chills. This may occur in bursitis caused by infection. DIAGNOSIS Bursitis may be diagnosed by:   Medical history and physical exam.  MRI.  A procedure to drain fluid from the bursa with a needle (aspiration). The fluid may be checked for signs of infection or gout.  Blood tests to rule out other causes of inflammation. TREATMENT  Bursitis can usually be treated at home with rest, ice, compression, and elevation (RICE). For mild bursitis, RICE treatment may be all you need. Other treatments may include:  Nonsteroidal anti-inflammatory drugs (NSAIDs) to treat pain and inflammation.  Corticosteroids to fight inflammation. You may have these drugs injected into and around the area of bursitis.  Aspiration of bursitis fluid to relieve pain and improve movement.  Antibiotic medicine to treat an infected bursa.  A splint, brace, or walking aid.  Physical therapy if you continue to have pain or limited movement.  Surgery to remove a damaged or infected bursa. This may be needed if you have a very bad case of bursitis or if other treatments have not worked. HOME CARE INSTRUCTIONS   Take medicines only as directed by your health care provider.  If you were prescribed an antibiotic medicine, finish it all even if you start to feel better.  Rest the affected area as directed by your health care provider.  Keep the area elevated.  Avoid activities that make pain worse.  Apply ice to the injured area:  Place ice in a plastic bag.  Place a towel between your skin and the bag.  Leave the ice on for 20 minutes, 2-3 times a day.  Use splints, braces, pads, or walking aids as  directed by your health care provider.  Keep all follow-up visits as directed by your health care provider. This is important. PREVENTION   Wear knee pads if you kneel often.  Wear sturdy running or walking shoes that fit you well.  Take regular breaks from repetitive activity.  Warm up by stretching before doing any strenuous activity.  Maintain a healthy weight or lose weight as recommended by your health care provider. Ask your health care provider if you need help.  Exercise regularly. Start any new physical activity gradually. SEEK MEDICAL CARE IF:   Your bursitis is not responding to treatment or home care.  You have a fever.  You have chills.   This information is not intended to replace advice given to you by your health care provider. Make sure you discuss any questions you have with your health care provider.   Document Released: 12/29/1999 Document Revised: 09/21/2014 Document Reviewed: 03/22/2013 Elsevier Interactive Patient Education 2016 Elsevier Inc.    Generic Ankle Exercises EXERCISES RANGE OF MOTION (ROM) AND STRETCHING EXERCISES These exercises may help you when beginning to rehabilitate your injury. Your symptoms may resolve with or without further involvement from your physician, physical therapist or athletic trainer. While completing these exercises, remember:   Restoring tissue flexibility helps normal motion to return to the joints. This allows healthier, less painful movement and activity.  An effective stretch should be held for at least 30 seconds.  A stretch should never be painful. You should only feel a gentle lengthening or release in the stretched tissue. RANGE OF MOTION - Dorsi/Plantar Flexion  While sitting with your right / left knee straight, draw the top of your foot upwards by flexing your ankle. Then reverse the motion, pointing your toes downward.  Hold each position for __________ seconds.  After completing your first set of  exercises, repeat this exercise with your knee bent. Repeat __________ times. Complete this exercise __________ times per day.  RANGE OF MOTION - Ankle Alphabet  Imagine your right / left big toe is a pen.  Keeping your hip and knee still, write out the entire alphabet with your "pen." Make the letters as large as you can without increasing any discomfort. Repeat __________ times. Complete this exercise __________ times per day.  RANGE OF MOTION - Ankle Dorsiflexion, Active Assisted   Remove shoes and sit on a chair that is preferably not on a carpeted surface.  Place right / left foot under knee. Extend your opposite leg for support.  Keeping your heel down, slide your right / left foot back toward the chair until you feel a stretch at your ankle or calf. If you do not feel a stretch, slide your bottom forward to the edge of the chair while still keeping your heel down.  Hold this stretch for __________ seconds. Repeat __________ times. Complete this stretch __________ times per day.  STRENGTHENING EXERCISES  These exercises may help you when beginning to rehabilitate your injury. They may resolve your  symptoms with or without further involvement from your physician, physical therapist or athletic trainer. While completing these exercises, remember:   Muscles can gain both the endurance and the strength needed for everyday activities through controlled exercises.  Complete these exercises as instructed by your physician, physical therapist or athletic trainer. Progress the resistance and repetitions only as guided.  You may experience muscle soreness or fatigue, but the pain or discomfort you are trying to eliminate should never worsen during these exercises. If this pain does worsen, stop and make certain you are following the directions exactly. If the pain is still present after adjustments, discontinue the exercise until you can discuss the trouble with your clinician. STRENGTH -  Dorsiflexors  Secure a rubber exercise band/tubing to a fixed object (table, pole) and loop the other end around your right / left foot.  Sit on the floor facing the fixed object. The band/tubing should be slightly tense when your foot is relaxed.  Slowly draw your foot back toward you using your ankle and toes.  Hold this position for __________ seconds. Slowly release the tension in the band and return your foot to the starting position. Repeat __________ times. Complete this exercise __________ times per day.  STRENGTH - Plantar-flexors  Sit with your right / left leg extended. Holding onto both ends of a rubber exercise band/tubing, loop it around the ball of your foot. Keep a slight tension in the band.  Slowly push your toes away from you, pointing them downward.  Hold this position for __________ seconds. Return slowly, controlling the tension in the band/tubing. Repeat __________ times. Complete this exercise __________ times per day.  STRENGTH - Ankle Eversion  Secure one end of a rubber exercise band/tubing to a fixed object (table, pole). Loop the other end around your foot just before your toes.  Place your fists between your knees. This will focus your strengthening at your ankle.  Drawing the band/tubing across your opposite foot, slowly, pull your little toe out and up. Make sure the band/tubing is positioned to resist the entire motion.  Hold this position for __________ seconds.  Have your muscles resist the band/tubing as it slowly pulls your foot back to the starting position. Repeat __________ times. Complete this exercise __________ times per day.  STRENGTH - Ankle Inversion  Secure one end of a rubber exercise band/tubing to a fixed object (table, pole). Loop the other end around your foot just before your toes.  Place your fists between your knees. This will focus your strengthening at your ankle.  Slowly, pull your big toe up and in, making sure the  band/tubing is positioned to resist the entire motion.  Hold this position for __________ seconds.  Have your muscles resist the band/tubing as it slowly pulls your foot back to the starting position. Repeat __________ times. Complete this exercises __________ times per day.  STRENGTH - Towel Curls  Sit in a chair positioned on a non-carpeted surface.  Place your foot on a towel, keeping your heel on the floor.  Pull the towel toward your heel by only curling your toes. Keep your heel on the floor. If instructed by your physician, physical therapist or athletic trainer, add weight to the end of the towel. Repeat __________ times. Complete this exercise __________ times per day. STRENGTH - Plantar-flexors, Standing  Stand with your feet shoulder width apart. Steady yourself with a wall or table using as little support as needed.  Keeping your weight evenly spread over the  width of your feet, rise up on your toes.*  Hold this position for __________ seconds. Repeat __________ times. Complete this exercise __________ times per day.  *If this is too easy, shift your weight toward your right / left leg until you feel challenged. Ultimately, you may be asked to do this exercise with your right / left foot only. BALANCE - Tandem Walking  Place your uninjured foot on a line 2-4 inches wide and at least 10 feet long.  Keeping your balance without using anything for extra support, place your right / left heel directly in front of your other foot.  Slowly raise your back foot up, lifting from the heel to the toes, and place it directly in front of the right / left foot.  Continue to walk along the line slowly. Walk for ____________________ feet. Repeat ____________________ times. Complete ____________________ times per day.   This information is not intended to replace advice given to you by your health care provider. Make sure you discuss any questions you have with your health care  provider.   Document Released: 11/14/2004 Document Revised: 01/21/2014 Document Reviewed: 04/14/2008 Elsevier Interactive Patient Education [employer].

## 2015-10-10 LAB — URINALYSIS, ROUTINE W REFLEX MICROSCOPIC
Bilirubin Urine: NEGATIVE
Glucose, UA: NEGATIVE
HGB URINE DIPSTICK: NEGATIVE
Ketones, ur: NEGATIVE
LEUKOCYTES UA: NEGATIVE
NITRITE: NEGATIVE
PROTEIN: NEGATIVE
Specific Gravity, Urine: 1.005 (ref 1.001–1.035)
pH: 6 (ref 5.0–8.0)

## 2015-10-10 LAB — MICROALBUMIN / CREATININE URINE RATIO
CREATININE, URINE: 51 mg/dL (ref 20–320)
MICROALB UR: 0.2 mg/dL
MICROALB/CREAT RATIO: 4 ug/mg{creat} (ref <30)

## 2015-10-10 LAB — HEMOGLOBIN A1C
Hgb A1c MFr Bld: 5.4 % (ref <5.7)
MEAN PLASMA GLUCOSE: 108 mg/dL

## 2015-10-10 LAB — VITAMIN D 25 HYDROXY (VIT D DEFICIENCY, FRACTURES): VIT D 25 HYDROXY: 36 ng/mL (ref 30–100)

## 2015-10-20 NOTE — Addendum Note (Signed)
Addended by: Vicie Mutters R on: 10/20/2015 09:43 AM   Modules accepted: Orders

## 2015-10-25 ENCOUNTER — Other Ambulatory Visit: Payer: Self-pay | Admitting: Physician Assistant

## 2015-10-25 DIAGNOSIS — Z1231 Encounter for screening mammogram for malignant neoplasm of breast: Secondary | ICD-10-CM

## 2015-11-29 DIAGNOSIS — H40003 Preglaucoma, unspecified, bilateral: Secondary | ICD-10-CM | POA: Diagnosis not present

## 2015-11-29 DIAGNOSIS — H43813 Vitreous degeneration, bilateral: Secondary | ICD-10-CM | POA: Diagnosis not present

## 2015-11-30 ENCOUNTER — Ambulatory Visit
Admission: RE | Admit: 2015-11-30 | Discharge: 2015-11-30 | Disposition: A | Discharge status: Home / Self Care | Payer: Commercial Managed Care - HMO | Source: Ambulatory Visit | Attending: Physician Assistant | Admitting: Physician Assistant

## 2015-11-30 DIAGNOSIS — Z1231 Encounter for screening mammogram for malignant neoplasm of breast: Secondary | ICD-10-CM

## 2015-11-30 DIAGNOSIS — E2839 Other primary ovarian failure: Secondary | ICD-10-CM

## 2015-11-30 DIAGNOSIS — Z13820 Encounter for screening for osteoporosis: Secondary | ICD-10-CM | POA: Diagnosis not present

## 2015-11-30 DIAGNOSIS — Z780 Asymptomatic menopausal state: Secondary | ICD-10-CM | POA: Diagnosis not present

## 2016-01-18 ENCOUNTER — Ambulatory Visit (INDEPENDENT_AMBULATORY_CARE_PROVIDER_SITE_OTHER): Payer: Medicare HMO | Admitting: Physician Assistant

## 2016-01-18 ENCOUNTER — Encounter: Payer: Self-pay | Admitting: Physician Assistant

## 2016-01-18 VITALS — BP 118/68 | HR 79 | Temp 97.3°F | Resp 16 | Ht 65.0 in | Wt 142.6 lb

## 2016-01-18 DIAGNOSIS — G8929 Other chronic pain: Secondary | ICD-10-CM | POA: Diagnosis not present

## 2016-01-18 DIAGNOSIS — E039 Hypothyroidism, unspecified: Secondary | ICD-10-CM | POA: Diagnosis not present

## 2016-01-18 DIAGNOSIS — Z79899 Other long term (current) drug therapy: Secondary | ICD-10-CM | POA: Diagnosis not present

## 2016-01-18 DIAGNOSIS — E559 Vitamin D deficiency, unspecified: Secondary | ICD-10-CM

## 2016-01-18 DIAGNOSIS — R6889 Other general symptoms and signs: Secondary | ICD-10-CM

## 2016-01-18 DIAGNOSIS — E785 Hyperlipidemia, unspecified: Secondary | ICD-10-CM | POA: Diagnosis not present

## 2016-01-18 DIAGNOSIS — H409 Unspecified glaucoma: Secondary | ICD-10-CM | POA: Diagnosis not present

## 2016-01-18 DIAGNOSIS — D649 Anemia, unspecified: Secondary | ICD-10-CM

## 2016-01-18 DIAGNOSIS — D126 Benign neoplasm of colon, unspecified: Secondary | ICD-10-CM | POA: Diagnosis not present

## 2016-01-18 DIAGNOSIS — I10 Essential (primary) hypertension: Secondary | ICD-10-CM

## 2016-01-18 DIAGNOSIS — Z0000 Encounter for general adult medical examination without abnormal findings: Secondary | ICD-10-CM

## 2016-01-18 DIAGNOSIS — Z0001 Encounter for general adult medical examination with abnormal findings: Secondary | ICD-10-CM

## 2016-01-18 DIAGNOSIS — K219 Gastro-esophageal reflux disease without esophagitis: Secondary | ICD-10-CM | POA: Diagnosis not present

## 2016-01-18 DIAGNOSIS — M25571 Pain in right ankle and joints of right foot: Secondary | ICD-10-CM

## 2016-01-18 DIAGNOSIS — R7309 Other abnormal glucose: Secondary | ICD-10-CM | POA: Diagnosis not present

## 2016-01-18 LAB — CBC WITH DIFFERENTIAL/PLATELET
BASOS ABS: 28 {cells}/uL (ref 0–200)
Basophils Relative: 1 %
EOS ABS: 56 {cells}/uL (ref 15–500)
Eosinophils Relative: 2 %
HCT: 36.6 % (ref 35.0–45.0)
Hemoglobin: 11.9 g/dL (ref 11.7–15.5)
LYMPHS PCT: 39 %
Lymphs Abs: 1092 cells/uL (ref 850–3900)
MCH: 29.3 pg (ref 27.0–33.0)
MCHC: 32.5 g/dL (ref 32.0–36.0)
MCV: 90.1 fL (ref 80.0–100.0)
MONO ABS: 168 {cells}/uL — AB (ref 200–950)
MPV: 9 fL (ref 7.5–12.5)
Monocytes Relative: 6 %
NEUTROS PCT: 52 %
Neutro Abs: 1456 cells/uL — ABNORMAL LOW (ref 1500–7800)
Platelets: 276 10*3/uL (ref 140–400)
RBC: 4.06 MIL/uL (ref 3.80–5.10)
RDW: 13.4 % (ref 11.0–15.0)
WBC: 2.8 10*3/uL — ABNORMAL LOW (ref 3.8–10.8)

## 2016-01-18 LAB — TSH: TSH: 0.49 mIU/L

## 2016-01-18 NOTE — Patient Instructions (Addendum)
Double up of vitamin, take 2 a day during the winter.   -Please pick one of the over the counter allergy medications below and take it once daily for allergies.  It will also help with fluid behind ear drums. Claritin or loratadine cheapest but likely the weakest  Zyrtec or certizine at night because it can make you sleepy The strongest is allegra or fexafinadine  Cheapest at [employer], [employer], [employer]  -can use decongestant over the counter, please do not use if you have high blood pressure or certain heart conditions.   if worsening HA, changes vision/speech, imbalance, weakness go to the ER   Fatigue Introduction Fatigue is feeling tired all of the time, a lack of energy, or a lack of motivation. Occasional or mild fatigue is often a normal response to activity or life in general. However, long-lasting (chronic) or extreme fatigue may indicate an underlying medical condition. Follow these instructions at home: Watch your fatigue for any changes. The following actions may help to lessen any discomfort you are feeling:  Talk to your health care provider about how much sleep you need each night. Try to get the required amount every night.  Take medicines only as directed by your health care provider.  Eat a healthy and nutritious diet. Ask your health care provider if you need help changing your diet.  Drink enough fluid to keep your urine clear or pale yellow.  Practice ways of relaxing, such as yoga, meditation, massage therapy, or acupuncture.  Exercise regularly.  Change situations that cause you stress. Try to keep your work and personal routine reasonable.  Do not abuse illegal drugs.  Limit alcohol intake to no more than 1 drink per day for nonpregnant women and 2 drinks per day for men. One drink equals 12 ounces of beer, 5 ounces of wine, or 1 ounces of hard liquor.  Take a multivitamin, if directed by your health care provider. Contact a health care provider if:  Your  fatigue does not get better.  You have a fever.  You have unintentional weight loss or gain.  You have headaches.  You have difficulty:  Falling asleep.  Sleeping throughout the night.  You feel angry, guilty, anxious, or sad.  You are unable to have a bowel movement (constipation).  You skin is dry.  Your legs or another part of your body is swollen. Get help right away if:  You feel confused.  Your vision is blurry.  You feel faint or pass out.  You have a severe headache.  You have severe abdominal, pelvic, or back pain.  You have chest pain, shortness of breath, or an irregular or fast heartbeat.  You are unable to urinate or you urinate less than normal.  You develop abnormal bleeding, such as bleeding from the rectum, vagina, nose, lungs, or nipples.  You vomit blood.  You have thoughts about harming yourself or committing suicide.  You are worried that you might harm someone else. This information is not intended to replace advice given to you by your health care provider. Make sure you discuss any questions you have with your health care provider. Document Released: 10/28/2006 Document Revised: 06/08/2015 Document Reviewed: 05/04/2013  2017 Elsevier

## 2016-01-18 NOTE — Progress Notes (Signed)
MEDICARE ANNUAL WELLNESS VISIT AND 3 month OV  Assessment:    Essential hypertension - continue medications, DASH diet, exercise and monitor at home. Call if greater than 130/80.  - CBC with Differential/Platelet - BASIC METABOLIC PANEL WITH GFR - Hepatic function panel - Urinalysis, Routine w reflex microscopic (not at [facility]) - Microalbumin / creatinine urine ratio - EKG 12-Lead  Prediabetes Discussed general issues about diabetes pathophysiology and management., Educational material distributed., Suggested low cholesterol diet., Encouraged aerobic exercise., Discussed foot care., Reminded to get yearly retinal exam. - Hemoglobin A1c - Insulin, fasting   Hypothyroidism, unspecified hypothyroidism type - TSH Hypothyroidism-check TSH level, continue medications the same, reminded to take on an empty stomach 30-39mins before food.    Glaucoma Continue to follow up with eye doctor  Vitamin D deficiency - Vit D  25 hydroxy (rtn osteoporosis monitoring)   Medication management - Magnesium  Hyperlipidemia -check lipids, decrease fatty foods, increase activity.  - Lipid panel  Gastroesophageal reflux disease with esophagitis Continue PPI/H2 blocker, diet discussed  Benign neoplasm of colon Colonoscopy due 2019   Anemia, unspecified anemia type - Iron and TIBC - Ferritin  Encounter for Medicare annual wellness exam  Chronic pain of right ankle  Not better with inserts/stretches, will refer to ortho -     Ambulatory referral to Orthopedic Surgery     Future Appointments Date Time Provider [place]  10/10/2016 9:00 AM Vicie Mutters, PA-C [id number] None     Plan:   During the course of the visit the patient was educated and counseled about appropriate screening and preventive services including:    Pneumococcal vaccine   Influenza vaccine  Td vaccine  Screening electrocardiogram  Screening mammography  Bone densitometry screening  Colorectal  cancer screening  Diabetes screening  Glaucoma screening  Nutrition counseling   Advanced directives: given information/requested   Subjective:   Robin Zimmerman is a 76 y.o. female who presents for Medicare Annual Wellness Visit and 3 month follow up for HTN, chol, preDM.   Bilateral feet pain with, right worse than left, right foot with medial ankle pain and right heel pain, worse later in the day and with walking on it, has tried inserts, not wearing flats, ice and stretches and still bothering her. No numbness and tingling. No heel pain worse in the morning.   Her blood pressure has been controlled at home, today their BP is BP: 118/68 She does not workout, very sporadic.  She denies chest pain, shortness of breath, dizziness.  She is not on cholesterol medication and denies myalgias. Her cholesterol is at goal. The cholesterol last visit was:   Lab Results  Component Value Date   CHOL 213 (H) 10/09/2015   HDL 102 10/09/2015   LDLCALC 99 10/09/2015   TRIG 58 10/09/2015   CHOLHDL 2.1 10/09/2015   She has been working on diet and exercise for prediabetes, and denies polydipsia, polyuria and visual disturbances. Last A1C in the office was:  Lab Results  Component Value Date   HGBA1C 5.4 10/09/2015   Patient is on Vitamin D supplement, 5000 IU daily. Lab Results  Component Value Date   VD25OH 36 10/09/2015   She is not on a thyroid medications.  Lab Results  Component Value Date   TSH 0.59 10/09/2015   She declines depression, has a difficult time remembering names of people.   Names of Other Physician/Practitioners you currently use: 1. [facility]- here for primary care 2. Dr.  Hadin eye doctor, last visit OCT 2017 Patient Care Team: Unk Pinto, MD as PCP - General (Internal Medicine) Juanita Craver, MD as Consulting Physician (Gastroenterology)   Medication Review Current Outpatient Prescriptions on File Prior to  Visit  Medication Sig Dispense Refill  . CALCIUM PO Take by mouth daily.    . Cholecalciferol (VITAMIN D PO) Take 5,000 Int'l Units by mouth daily.    . IRON PO Take by mouth daily.     No current facility-administered medications on file prior to visit.     Current Problems (verified) Patient Active Problem List   Diagnosis Date Noted  . Encounter for Medicare annual wellness exam 01/18/2016  . Absolute anemia 07/20/2014  . Glaucoma 04/14/2014  . Hypothyroidism 01/05/2014  . Vitamin D deficiency 04/04/2013  . Medication management 04/04/2013  . Other abnormal glucose 01/19/2013  . GERD (gastroesophageal reflux disease)   . Hyperlipidemia   . Hypertension   . Benign neoplasm of colon 02/16/2007    Screening Tests Immunization History  Administered Date(s) Administered  . Td 06/30/2012    Preventative care: Last colonoscopy: 2014 due 2019 Last mammogram: 07/2015 Last pap smear/pelvic exam: 2012   DEXA: 2017  Prior vaccinations: TD or Tdap: 2014 Influenza: declines Prevnar 13 Declines Pneumococcal: declines Shingles/Zostavax: declines  Allergies No Known Allergies  SURGICAL HISTORY She  has a past surgical history that includes Tubal ligation (Right) and Cholecystectomy. FAMILY HISTORY Her family history includes Alzheimer's disease in her father; Heart disease in her mother. SOCIAL HISTORY She  reports that she quit smoking about 41 years ago. She has never used smokeless tobacco. She reports that she does not drink alcohol or use drugs.  MEDICARE WELLNESS OBJECTIVES: Physical activity: no regular exercise Depression/mood screen:   Depression screen [facility] 2/9 01/26/2015  Decreased Interest 0  Down, Depressed, Hopeless 0  PHQ - 2 Score 0   ADLs:  In your present state of health, do you have any difficulty performing the following activities: 01/26/2015  Hearing? N  Vision? N  Difficulty concentrating or making decisions? N  Walking or climbing stairs? N   Dressing or bathing? N  Doing errands, shopping? N  Some recent data might be hidden    Cognitive Testing  Alert? Yes  Normal Appearance?Yes  Oriented to person? Yes  Place? Yes   Time? Yes  Recall of three objects?  Yes  Can perform simple calculations? Yes  Displays appropriate judgment?Yes  Can read the correct time from a watch face?Yes  EOL planning: Does Patient Have a Medical Advance Directive?: No    Objective:     Blood pressure 118/68, pulse 79, temperature 97.3 F (36.3 C), resp. rate 16, height 5\' 5"  (1.651 m), weight 142 lb 9.6 oz (64.7 kg), SpO2 97 %. Body mass index is 23.73 kg/m.  General appearance: alert, no distress, WD/WN,  female HEENT: normocephalic, sclerae anicteric, TMs pearly, nares patent, no discharge or erythema, pharynx normal Oral cavity: MMM, no lesions Neck: supple, no lymphadenopathy, no thyromegaly, no masses Heart: RRR, normal S1, S2, no murmurs Lungs: CTA bilaterally, no wheezes, rhonchi, or rales Abdomen: +bs, soft, non tender, non distended, no masses, no hepatomegaly, no splenomegaly Musculoskeletal: nontender, no swelling, no obvious deformity, right foot without deformity, no pain with plantar or dorsal flexion but + pain with rotation of ankle, pain with palpation at hallucis extensor or tibialis anterior to palpation.  Extremities: no edema, no cyanosis, no clubbing Skin: Seb keratosis over back and AB Pulses: 2+ symmetric, upper  and lower extremities, normal cap refill Neurological: alert, oriented x 3, CN2-12 intact, strength normal upper extremities and lower extremities, sensation normal throughout, DTRs 2+ throughout, no cerebellar signs, gait normal Psychiatric: normal affect, behavior normal, pleasant  Breast:  nontender, with several mobile round lumps, no skin changes, no nipple discharge or inversion, no axillary lymphadenopathy Gyn: defer  Rectal: defer  Medicare Attestation I have personally reviewed: The patient's  medical and social history Their use of alcohol, tobacco or illicit drugs Their current medications and supplements The patient's functional ability including ADLs,fall risks, home safety risks, cognitive, and hearing and visual impairment Diet and physical activities Evidence for depression or mood disorders  The patient's weight, height, BMI, and visual acuity have been recorded in the chart.  I have made referrals, counseling, and provided education to the patient based on review of the above and I have provided the patient with a written personalized care plan for preventive services.     Vicie Mutters, PA-C   01/18/2016

## 2016-01-19 LAB — LIPID PANEL
Cholesterol: 232 mg/dL — ABNORMAL HIGH (ref <200)
HDL: 109 mg/dL (ref >50)
LDL Cholesterol: 112 mg/dL — ABNORMAL HIGH (ref <100)
TRIGLYCERIDES: 55 mg/dL (ref <150)
Total CHOL/HDL Ratio: 2.1 Ratio (ref <5.0)
VLDL: 11 mg/dL (ref <30)

## 2016-01-19 LAB — BASIC METABOLIC PANEL WITH GFR
BUN: 12 mg/dL (ref 7–25)
CALCIUM: 9.4 mg/dL (ref 8.6–10.4)
CO2: 25 mmol/L (ref 20–31)
Chloride: 105 mmol/L (ref 98–110)
Creat: 1.09 mg/dL — ABNORMAL HIGH (ref 0.60–0.93)
GFR, EST AFRICAN AMERICAN: 57 mL/min — AB (ref >=60)
GFR, Est Non African American: 50 mL/min — ABNORMAL LOW (ref >=60)
GLUCOSE: 75 mg/dL (ref 65–99)
Potassium: 4.3 mmol/L (ref 3.5–5.3)
Sodium: 144 mmol/L (ref 135–146)

## 2016-01-19 LAB — HEPATIC FUNCTION PANEL
ALBUMIN: 4.2 g/dL (ref 3.6–5.1)
ALK PHOS: 56 U/L (ref 33–130)
ALT: 10 U/L (ref 6–29)
AST: 17 U/L (ref 10–35)
Bilirubin, Direct: 0.1 mg/dL (ref <=0.2)
Indirect Bilirubin: 0.5 mg/dL (ref 0.2–1.2)
TOTAL PROTEIN: 7.3 g/dL (ref 6.1–8.1)
Total Bilirubin: 0.6 mg/dL (ref 0.2–1.2)

## 2016-01-19 LAB — VITAMIN D 25 HYDROXY (VIT D DEFICIENCY, FRACTURES): VIT D 25 HYDROXY: 40 ng/mL (ref 30–100)

## 2016-01-19 LAB — MAGNESIUM: Magnesium: 2.2 mg/dL (ref 1.5–2.5)

## 2016-01-25 ENCOUNTER — Encounter (INDEPENDENT_AMBULATORY_CARE_PROVIDER_SITE_OTHER): Payer: Self-pay | Admitting: Orthopedic Surgery

## 2016-01-25 ENCOUNTER — Ambulatory Visit (INDEPENDENT_AMBULATORY_CARE_PROVIDER_SITE_OTHER): Payer: Commercial Managed Care - HMO | Admitting: Orthopedic Surgery

## 2016-01-25 ENCOUNTER — Ambulatory Visit (INDEPENDENT_AMBULATORY_CARE_PROVIDER_SITE_OTHER): Payer: Commercial Managed Care - HMO

## 2016-01-25 VITALS — Ht 65.0 in | Wt 142.0 lb

## 2016-01-25 DIAGNOSIS — M76821 Posterior tibial tendinitis, right leg: Secondary | ICD-10-CM

## 2016-01-25 DIAGNOSIS — M25571 Pain in right ankle and joints of right foot: Secondary | ICD-10-CM

## 2016-01-25 NOTE — Progress Notes (Signed)
Office Visit Note   Patient: Robin Zimmerman           Date of Birth: 05/29/1940           MRN: [id number] Visit Date: 01/25/2016              Requested by: Vicie Mutters, PA-C [address] [place], [place] [address] PCP: Alesia Richards, MD  Chief Complaint  Patient presents with  . Right Ankle - Pain    HPI: Patient presents today with right foot and ankle pain. Pain and instep radiates into her ankle joint. This has been ongoing since May. She cannot recall any known injury. She thought the pain was coming from her being flat footed, she had purchased insoles without relief. She does have swelling and icing, she has tried compression with ace bandage and dorsiflexion exercises with theraband. She experienced only minimal relief. She has cramping in evening in left foot and toes and right leg on occasion. She is not currently taking anything for pain. Stepheney L Peele, RT.  Patient is a 76 year old woman who is seen today for evaluation of right ankle pain. This is been going on since last summer. No known injury. She states that she is flat-footed and has been trying different insoles for her shoes wonders why this hasn't helped. She doesn't trying ice and elevation and Ace wraps for compression. Also has been working on dorsiflexion exercises with no improvement in pain. States pain is worse in the morning. This pain worsened by ambulation. Has not tried any medicines for pain.    Assessment & Plan: Visit Diagnoses:  1. Pain in right ankle and joints of right foot     Plan: Have provided her with a PTT brace today. She will take Aleve 2 tablets twice a day 2 weeks. Follow-up in 3 weeks.  Follow-Up Instructions: No Follow-up on file.   Exam: Patient is alert and oriented. No adenopathy. Well-dressed. Normal affect. Respirations easy.  Right Ankle Exam  Swelling: none  Tenderness  Right ankle tenderness location: over posterior tibial  tendon.    Range of Motion  The patient has normal right ankle ROM.  Muscle Strength  The patient has normal right ankle strength.  Tests  Anterior drawer: negative Varus tilt: negative  Other  Erythema: present Pulse: present   Comments:  Pain over posterior tibial tendon along  navicular    Origin of plantar fascia is nontender.   Imaging: No results found.  Orders:  Orders Placed This Encounter  Procedures  . XR Ankle Complete Right   No orders of the defined types were placed in this encounter.    Procedures: No procedures performed  Clinical Data: No additional findings.  Subjective: Review of Systems  Objective: Vital Signs: Ht 5\' 5"  (1.651 m)   Wt 142 lb (64.4 kg)   BMI 23.63 kg/m   Specialty Comments:  No specialty comments available.  PMFS History: Patient Active Problem List   Diagnosis Date Noted  . Encounter for Medicare annual wellness exam 01/18/2016  . Absolute anemia 07/20/2014  . Glaucoma 04/14/2014  . Hypothyroidism 01/05/2014  . Vitamin D deficiency 04/04/2013  . Medication management 04/04/2013  . Other abnormal glucose 01/19/2013  . GERD (gastroesophageal reflux disease)   . Hyperlipidemia   . Hypertension   . Benign neoplasm of colon 02/16/2007   Past Medical History:  Diagnosis Date  . Anemia   . GERD (gastroesophageal reflux disease)   . Glaucoma   .  Hyperlipidemia   . Hypertension   . Thyroid disease     Family History  Problem Relation Age of Onset  . Heart disease Mother   . Alzheimer's disease Father     Past Surgical History:  Procedure Laterality Date  . CHOLECYSTECTOMY    . TUBAL LIGATION Right    Social History   Occupational History  . Not on file.   Social History Main Topics  . Smoking status: Former Smoker    Quit date: 09/15/1974  . Smokeless tobacco: Never Used  . Alcohol use No  . Drug use: No  . Sexual activity: Not on file

## 2016-02-15 ENCOUNTER — Ambulatory Visit (INDEPENDENT_AMBULATORY_CARE_PROVIDER_SITE_OTHER): Payer: Commercial Managed Care - HMO | Admitting: Orthopedic Surgery

## 2016-02-15 ENCOUNTER — Encounter (INDEPENDENT_AMBULATORY_CARE_PROVIDER_SITE_OTHER): Payer: Self-pay | Admitting: Orthopedic Surgery

## 2016-02-15 VITALS — Ht 65.0 in | Wt 142.0 lb

## 2016-02-15 DIAGNOSIS — M2142 Flat foot [pes planus] (acquired), left foot: Secondary | ICD-10-CM

## 2016-02-15 DIAGNOSIS — M76821 Posterior tibial tendinitis, right leg: Secondary | ICD-10-CM | POA: Diagnosis not present

## 2016-02-15 DIAGNOSIS — M2141 Flat foot [pes planus] (acquired), right foot: Secondary | ICD-10-CM

## 2016-02-15 DIAGNOSIS — M2140 Flat foot [pes planus] (acquired), unspecified foot: Secondary | ICD-10-CM | POA: Insufficient documentation

## 2016-02-15 NOTE — Progress Notes (Signed)
Office Visit Note   Patient: Robin Zimmerman           Date of Birth: [birth year]           MRN: [id number] Visit Date: 02/15/2016              Requested by: Unk Pinto, MD [address] [place], [place] [address] PCP: Alesia Richards, MD  Chief Complaint  Patient presents with  . Right Ankle - Follow-up    3 week follow up PTT    HPI: Patient presents today for right posterior tibial tendonitis. She has been wearing posterior tibial tendon brace and has some discomfort with this on occasion. She states it does feel easier to walker, she has gotten relief with NSAIDS. She does feel better overall. [name], RT  Patient is a 76 year old woman who presents today in follow-up for posterior tibial tendinitis on the right. She has been using a posterior tibial tendon brace as needed. Has also been using ibuprofen when necessary. States she has had improvement in her symptoms. Reduced pain with ambulation. Is unable to go out into good bit of shopping and walking without pain without the brace. Is pleased with her progress overall.    Assessment & Plan: Visit Diagnoses:  1. Posterior tibial tendinitis of right lower extremity   2. Pes planus of both feet     Plan: Discussed the importance of our support shoes moving forward into the future. She will continue with her conservative treatments in follow-up in the office as needed.  Follow-Up Instructions: Return if symptoms worsen or fail to improve.   Ortho Exam Physical Exam  Constitutional: Appears well-developed.  Head: Normocephalic.  Eyes: EOM are normal.  Neck: Normal range of motion.  Cardiovascular: Normal rate.   Pulmonary/Chest: Effort normal.  Neurological: Is alert.  Skin: Skin is warm.  Psychiatric: Has a normal mood and affect. Right foot/ankle: minimal tenderness over the posterior tibial tendon on the right. Does have a little bit of tenderness to the origin of plantar  fascia. No swelling no erythema.  Imaging: No results found.  Orders:  No orders of the defined types were placed in this encounter.  No orders of the defined types were placed in this encounter.    Procedures: No procedures performed  Clinical Data: No additional findings.  Subjective: Review of Systems  Constitutional: Negative for chills and fever.  Musculoskeletal: Positive for myalgias.    Objective: Vital Signs: Ht 5\' 5"  (1.651 m)   Wt 142 lb (64.4 kg)   BMI 23.63 kg/m   Specialty Comments:  No specialty comments available.  PMFS History: Patient Active Problem List   Diagnosis Date Noted  . Pes planus 02/15/2016  . Posterior tibial tendinitis of right lower extremity 01/25/2016  . Encounter for Medicare annual wellness exam 01/18/2016  . Absolute anemia 07/20/2014  . Glaucoma 04/14/2014  . Hypothyroidism 01/05/2014  . Vitamin D deficiency 04/04/2013  . Medication management 04/04/2013  . Other abnormal glucose 01/19/2013  . GERD (gastroesophageal reflux disease)   . Hyperlipidemia   . Hypertension   . Benign neoplasm of colon 02/16/2007   Past Medical History:  Diagnosis Date  . Anemia   . GERD (gastroesophageal reflux disease)   . Glaucoma   . Hyperlipidemia   . Hypertension   . Thyroid disease     Family History  Problem Relation Age of Onset  . Heart disease Mother   . Alzheimer's disease Father  Past Surgical History:  Procedure Laterality Date  . CHOLECYSTECTOMY    . TUBAL LIGATION Right    Social History   Occupational History  . Not on file.   Social History Main Topics  . Smoking status: Former Smoker    Quit date: 09/15/1974  . Smokeless tobacco: Never Used  . Alcohol use No  . Drug use: No  . Sexual activity: Not on file

## 2016-02-22 ENCOUNTER — Ambulatory Visit (INDEPENDENT_AMBULATORY_CARE_PROVIDER_SITE_OTHER): Payer: Medicare HMO

## 2016-02-22 DIAGNOSIS — Z79899 Other long term (current) drug therapy: Secondary | ICD-10-CM | POA: Diagnosis not present

## 2016-02-22 LAB — BASIC METABOLIC PANEL WITH GFR
BUN: 16 mg/dL (ref 7–25)
CO2: 29 mmol/L (ref 20–31)
Calcium: 9.3 mg/dL (ref 8.6–10.4)
Chloride: 104 mmol/L (ref 98–110)
Creat: 1.03 mg/dL — ABNORMAL HIGH (ref 0.60–0.93)
GFR, EST AFRICAN AMERICAN: 61 mL/min (ref >=60)
GFR, EST NON AFRICAN AMERICAN: 53 mL/min — AB (ref >=60)
GLUCOSE: 71 mg/dL (ref 65–99)
POTASSIUM: 3.8 mmol/L (ref 3.5–5.3)
Sodium: 142 mmol/L (ref 135–146)

## 2016-02-22 NOTE — Progress Notes (Signed)
Pt present for BMET lab work  Pt had no question or concerns @ this time

## 2016-06-06 DIAGNOSIS — H40003 Preglaucoma, unspecified, bilateral: Secondary | ICD-10-CM | POA: Diagnosis not present

## 2016-09-03 ENCOUNTER — Encounter: Payer: Self-pay | Admitting: Physician Assistant

## 2016-09-26 ENCOUNTER — Ambulatory Visit (INDEPENDENT_AMBULATORY_CARE_PROVIDER_SITE_OTHER): Payer: Medicare HMO | Admitting: Internal Medicine

## 2016-09-26 ENCOUNTER — Encounter: Payer: Self-pay | Admitting: Internal Medicine

## 2016-09-26 VITALS — BP 142/74 | HR 84 | Temp 97.7°F | Resp 18 | Ht 64.5 in | Wt 142.2 lb

## 2016-09-26 DIAGNOSIS — E782 Mixed hyperlipidemia: Secondary | ICD-10-CM

## 2016-09-26 DIAGNOSIS — Z0000 Encounter for general adult medical examination without abnormal findings: Secondary | ICD-10-CM | POA: Diagnosis not present

## 2016-09-26 DIAGNOSIS — E559 Vitamin D deficiency, unspecified: Secondary | ICD-10-CM | POA: Diagnosis not present

## 2016-09-26 DIAGNOSIS — Z136 Encounter for screening for cardiovascular disorders: Secondary | ICD-10-CM

## 2016-09-26 DIAGNOSIS — I10 Essential (primary) hypertension: Secondary | ICD-10-CM | POA: Diagnosis not present

## 2016-09-26 DIAGNOSIS — Z79899 Other long term (current) drug therapy: Secondary | ICD-10-CM

## 2016-09-26 DIAGNOSIS — Z1212 Encounter for screening for malignant neoplasm of rectum: Secondary | ICD-10-CM

## 2016-09-26 DIAGNOSIS — Z0001 Encounter for general adult medical examination with abnormal findings: Secondary | ICD-10-CM

## 2016-09-26 DIAGNOSIS — Z1211 Encounter for screening for malignant neoplasm of colon: Secondary | ICD-10-CM

## 2016-09-26 DIAGNOSIS — R7303 Prediabetes: Secondary | ICD-10-CM | POA: Diagnosis not present

## 2016-09-26 NOTE — Progress Notes (Signed)
[facility] Unk Pinto, M.D.      Robin Zimmerman. Silverio Lay, P.A.-C [facility]                [address] [place], [place] [address] Telephone [phone] Telefax [phone]  Annual Screening/Preventative Visit & Comprehensive Evaluation &  Examination     This very nice 76 y.o. DBF presents for a Screening/Preventative Visit & comprehensive evaluation and management of multiple medical co-morbidities.  Patient has been followed for labile HTN, Prediabetes, Hyperlipidemia and Vitamin D Deficiency. Patient has hx/o Thyroiditis in 2011 apparently recovered and is monitored for incipient /indolent hypothyroidism.       Patient's BP has been controlled at home and patient denies any cardiac symptoms as chest pain, palpitations, shortness of breath, dizziness or ankle swelling. Today's BP is sl elevated at 142/74.      Patient's hyperlipidemia is not controlled with diet. Patient admits recent poor dietary indiscretions.  Last lipids were not at goal: Lab Results  Component Value Date   CHOL 232 (H) 01/18/2016   HDL 109 01/18/2016   LDLCALC 112 (H) 01/18/2016   TRIG 55 01/18/2016   CHOLHDL 2.1 01/18/2016      Patient has prediabetes (A1c 5.9% in 2015) and patient denies reactive hypoglycemic symptoms, visual blurring, diabetic polys, or paresthesias. Last A1c was at goal: Lab Results  Component Value Date   HGBA1C 5.4 10/09/2015      Finally, patient has history of Vitamin D Deficiency ("38" in 2015) and does not supplement as recommended. Last Vitamin D was still low  Lab Results  Component Value Date   VD25OH 40 01/18/2016   Current Outpatient Prescriptions on File Prior to Visit  Medication Sig  . CALCIUM Takes daily.  Marland Kitchen VITAMIN D  Takes 5,000 Int'l Units very infrequently   . IRON  Takes  daily.  Dorette Grate opht soln    No Known Allergies   Past Medical History:  Diagnosis Date  . Anemia    . GERD (gastroesophageal reflux disease)   . Glaucoma   . Hyperlipidemia   . Hypertension   . Thyroid disease    Health Maintenance  Topic Date Due  . PNA vac Low Risk Adult (1 of 2 - PCV13) 11/07/2017 (Originally 10/17/2005)  . INFLUENZA VACCINE  07/04/2018 (Originally 08/14/2016)  . TETANUS/TDAP  07/01/2022  . COLONOSCOPY  09/24/2022  . DEXA SCAN  Completed   Immunization History  Administered Date(s) Administered  . Td 06/30/2012   Past Surgical History:  Procedure Laterality Date  . CHOLECYSTECTOMY    . TUBAL LIGATION Right    Family History  Problem Relation Age of Onset  . Heart disease Mother   . Alzheimer's disease Father    Social History  Substance Use Topics  . Smoking status: Former Smoker    Quit date: 09/15/1974  . Smokeless tobacco: Never Used  . Alcohol use No    ROS Constitutional: Denies fever, chills, weight loss/gain, headaches, insomnia,  night sweats, and change in appetite. Does c/o fatigue. Eyes: Denies redness, blurred vision, diplopia, discharge, itchy, watery eyes.  ENT: Denies discharge, congestion, post nasal drip, epistaxis, sore throat, earache, hearing loss, dental pain, Tinnitus, Vertigo, Sinus pain, snoring.  Cardio: Denies chest pain, palpitations, irregular heartbeat, syncope, dyspnea, diaphoresis, orthopnea, PND, claudication, edema Respiratory: denies cough, dyspnea, DOE, pleurisy, hoarseness, laryngitis, wheezing.  Gastrointestinal: Denies dysphagia,  heartburn, reflux, water brash, pain, cramps, nausea, vomiting, bloating, diarrhea, constipation, hematemesis, melena, hematochezia, jaundice, hemorrhoids Genitourinary: Denies dysuria, frequency, urgency, nocturia, hesitancy, discharge, hematuria, flank pain Breast: Breast lumps, nipple discharge, bleeding.  Musculoskeletal: Denies arthralgia, myalgia, stiffness, Jt. Swelling, pain, limp, and strain/sprain. Denies falls. Skin: Denies puritis, rash, hives, warts, acne, eczema, changing in  skin lesion Neuro: No weakness, tremor, incoordination, spasms, paresthesia, pain Psychiatric: Denies confusion, memory loss, sensory loss. Denies Depression. Endocrine: Denies change in weight, skin, hair change, nocturia, and paresthesia, diabetic polys, visual blurring, hyper / hypo glycemic episodes.  Heme/Lymph: No excessive bleeding, bruising, enlarged lymph nodes.  Physical Exam  BP (!) 142/74   Pulse 84   Temp 97.7 F (36.5 C)   Resp 18   Ht 5' 4.5" (1.638 m)   Wt 142 lb 3.2 oz (64.5 kg)   BMI 24.03 kg/m   General Appearance: Well nourished, well groomed and in no apparent distress.  Eyes: PERRLA, EOMs, conjunctiva no swelling or erythema, normal fundi and vessels. Sinuses: No frontal/maxillary tenderness ENT/Mouth: EACs patent / TMs  nl. Nares clear without erythema, swelling, mucoid exudates. Oral hygiene is good. No erythema, swelling, or exudate. Tongue normal, non-obstructing. Tonsils not swollen or erythematous. Hearing normal.  Neck: Supple, thyroid normal. No bruits, nodes or JVD. Respiratory: Respiratory effort normal.  BS equal and clear bilateral without rales, rhonci, wheezing or stridor. Cardio: Heart sounds are normal with regular rate and rhythm and no murmurs, rubs or gallops. Peripheral pulses are normal and equal bilaterally without edema. No aortic or femoral bruits. Chest: symmetric with normal excursions and percussion. Breasts: Symmetric, without lumps, nipple discharge, retractions, or fibrocystic changes.  Abdomen: Flat, soft with bowel sounds active. Nontender, no guarding, rebound, hernias, masses, or organomegaly.  Lymphatics: Non tender without lymphadenopathy.  Genitourinary:  Musculoskeletal: Full ROM all peripheral extremities, joint stability, 5/5 strength, and normal gait. Skin: Warm and dry without rashes, lesions, cyanosis, clubbing or  ecchymosis.  Neuro: Cranial nerves intact, reflexes equal bilaterally. Normal muscle tone, no cerebellar  symptoms. Sensation intact.  Pysch: Alert and oriented X 3, normal affect, Insight and Judgment appropriate.   Assessment and Plan  1. Annual Preventative Screening Examination  2. Essential hypertension  - EKG 12-Lead - Urinalysis, Routine w reflex microscopic - Microalbumin / creatinine urine ratio - CBC with Differential/Platelet - BASIC METABOLIC PANEL WITH GFR - Magnesium - TSH  3. Hyperlipidemia, mixed  - EKG 12-Lead - Hepatic function panel - Lipid panel - TSH  4. Prediabetes  - Hemoglobin A1c - Insulin, fasting  5. Vitamin D deficiency  - VITAMIN D 25 Hydroxy   6. Screening for ischemic heart disease  - EKG 12-Lead  7. Screening for colorectal cancer  - POC Hemoccult Bld/Stl   8. Medication management  - Urinalysis, Routine w reflex microscopic - Microalbumin / creatinine urine ratio - CBC with Differential/Platelet - BASIC METABOLIC PANEL WITH GFR - Hepatic function panel - Magnesium - Lipid panel - TSH - Hemoglobin A1c - Insulin, fasting - VITAMIN D 25 Hydroxy       Patient was counseled in prudent diet to achieve/maintain BMI less than 25 for weight control, BP monitoring, regular exercise and medications. Discussed med's effects and SE's. Screening labs and tests as requested with regular follow-up as recommended. Over 40 minutes of exam, counseling, chart review and high complex critical decision making was performed.

## 2016-09-26 NOTE — Patient Instructions (Signed)

## 2016-09-27 LAB — CBC WITH DIFFERENTIAL/PLATELET
BASOS ABS: 19 {cells}/uL (ref 0–200)
BASOS PCT: 0.6 %
EOS ABS: 38 {cells}/uL (ref 15–500)
Eosinophils Relative: 1.2 %
HCT: 33.2 % — ABNORMAL LOW (ref 35.0–45.0)
HEMOGLOBIN: 10.9 g/dL — AB (ref 11.7–15.5)
Lymphs Abs: 976 cells/uL (ref 850–3900)
MCH: 29.2 pg (ref 27.0–33.0)
MCHC: 32.8 g/dL (ref 32.0–36.0)
MCV: 89 fL (ref 80.0–100.0)
MONOS PCT: 6.9 %
MPV: 9.9 fL (ref 7.5–12.5)
Neutro Abs: 1946 cells/uL (ref 1500–7800)
Neutrophils Relative %: 60.8 %
PLATELETS: 261 10*3/uL (ref 140–400)
RBC: 3.73 10*6/uL — ABNORMAL LOW (ref 3.80–5.10)
RDW: 12.3 % (ref 11.0–15.0)
Total Lymphocyte: 30.5 %
WBC: 3.2 10*3/uL — ABNORMAL LOW (ref 3.8–10.8)
WBCMIX: 221 {cells}/uL (ref 200–950)

## 2016-09-27 LAB — LIPID PANEL
CHOL/HDL RATIO: 2.1 (calc) (ref <5.0)
Cholesterol: 200 mg/dL — ABNORMAL HIGH (ref <200)
HDL: 95 mg/dL (ref >50)
LDL CHOLESTEROL (CALC): 89 mg/dL
NON-HDL CHOLESTEROL (CALC): 105 mg/dL (ref <130)
Triglycerides: 73 mg/dL (ref <150)

## 2016-09-27 LAB — BASIC METABOLIC PANEL WITH GFR
BUN / CREAT RATIO: 12 (calc) (ref 6–22)
BUN: 12 mg/dL (ref 7–25)
CO2: 30 mmol/L (ref 20–32)
CREATININE: 0.98 mg/dL — AB (ref 0.60–0.93)
Calcium: 9.2 mg/dL (ref 8.6–10.4)
Chloride: 103 mmol/L (ref 98–110)
GFR, EST AFRICAN AMERICAN: 65 mL/min/{1.73_m2} (ref >=60)
GFR, Est Non African American: 56 mL/min/{1.73_m2} — ABNORMAL LOW (ref >=60)
Glucose, Bld: 82 mg/dL (ref 65–99)
Potassium: 4 mmol/L (ref 3.5–5.3)
SODIUM: 141 mmol/L (ref 135–146)

## 2016-09-27 LAB — HEPATIC FUNCTION PANEL
AG RATIO: 1.4 (calc) (ref 1.0–2.5)
ALKALINE PHOSPHATASE (APISO): 62 U/L (ref 33–130)
ALT: 8 U/L (ref 6–29)
AST: 14 U/L (ref 10–35)
Albumin: 4.2 g/dL (ref 3.6–5.1)
BILIRUBIN DIRECT: 0.1 mg/dL (ref 0.0–0.2)
BILIRUBIN INDIRECT: 0.5 mg/dL (ref 0.2–1.2)
GLOBULIN: 3 g/dL (ref 1.9–3.7)
Total Bilirubin: 0.6 mg/dL (ref 0.2–1.2)
Total Protein: 7.2 g/dL (ref 6.1–8.1)

## 2016-09-27 LAB — URINALYSIS, ROUTINE W REFLEX MICROSCOPIC
Bilirubin Urine: NEGATIVE
GLUCOSE, UA: NEGATIVE
HGB URINE DIPSTICK: NEGATIVE
Ketones, ur: NEGATIVE
LEUKOCYTES UA: NEGATIVE
Nitrite: NEGATIVE
PH: 7.5 (ref 5.0–8.0)
Protein, ur: NEGATIVE
SPECIFIC GRAVITY, URINE: 1.012 (ref 1.001–1.03)

## 2016-09-27 LAB — URINE CULTURE
MICRO NUMBER:: 81012182
Result:: NO GROWTH
SPECIMEN QUALITY:: ADEQUATE

## 2016-09-27 LAB — HEMOGLOBIN A1C
HEMOGLOBIN A1C: 5.4 %{Hb} (ref <5.7)
Mean Plasma Glucose: 108 (calc)
eAG (mmol/L): 6 (calc)

## 2016-09-27 LAB — INSULIN, FASTING: Insulin: 7.3 u[IU]/mL (ref 2.0–19.6)

## 2016-09-27 LAB — MICROALBUMIN / CREATININE URINE RATIO: Creatinine, Urine: 75 mg/dL (ref 20–275)

## 2016-09-27 LAB — VITAMIN D 25 HYDROXY (VIT D DEFICIENCY, FRACTURES): Vit D, 25-Hydroxy: 43 ng/mL (ref 30–100)

## 2016-09-27 LAB — TSH: TSH: 0.34 m[IU]/L — AB (ref 0.40–4.50)

## 2016-09-27 LAB — MAGNESIUM: Magnesium: 2.2 mg/dL (ref 1.5–2.5)

## 2016-10-10 ENCOUNTER — Encounter: Payer: Self-pay | Admitting: Physician Assistant

## 2016-11-07 ENCOUNTER — Other Ambulatory Visit: Payer: Self-pay

## 2016-11-07 DIAGNOSIS — Z1212 Encounter for screening for malignant neoplasm of rectum: Principal | ICD-10-CM

## 2016-11-07 DIAGNOSIS — Z1211 Encounter for screening for malignant neoplasm of colon: Secondary | ICD-10-CM

## 2016-11-07 LAB — POC HEMOCCULT BLD/STL (HOME/3-CARD/SCREEN)
Card #2 Fecal Occult Blod, POC: NEGATIVE
FECAL OCCULT BLD: NEGATIVE
FECAL OCCULT BLD: NEGATIVE

## 2016-11-22 DIAGNOSIS — H2513 Age-related nuclear cataract, bilateral: Secondary | ICD-10-CM | POA: Diagnosis not present

## 2016-11-22 DIAGNOSIS — H40003 Preglaucoma, unspecified, bilateral: Secondary | ICD-10-CM | POA: Diagnosis not present

## 2016-12-29 NOTE — Progress Notes (Signed)
3 month OV  Assessment:    Essential hypertension - continue medications, DASH diet, exercise and monitor at home. Call if greater than 130/80.  - CBC with Differential/Platelet - BASIC METABOLIC PANEL WITH GFR - Hepatic function panel   Prediabetes Discussed general issues about diabetes pathophysiology and management., Educational material distributed., Suggested low cholesterol diet., Encouraged aerobic exercise., Discussed foot care., Reminded to get yearly retinal exam. - Hemoglobin A1c - Insulin, fasting   Hypothyroidism, unspecified hypothyroidism type - TSH Hypothyroidism-check TSH level, continue medications the same, reminded to take on an empty stomach 30-8mins before food.   Vitamin D deficiency - Vit D  25 hydroxy (rtn osteoporosis monitoring)   Medication management - Magnesium  Hyperlipidemia -check lipids, decrease fatty foods, increase activity.  - Lipid panel  Cough -     DG Chest 2 View; Future Likely more from post nasal drip, get on allergy pill     Future Appointments  Date Time Provider [place]  04/09/2017  9:30 AM Unk Pinto, MD [facility] None  10/07/2017  9:00 AM Vicie Mutters, PA-C [facility] None    Subjective:   Robin Zimmerman is a 76 y.o. female who presents for  3 month follow up for HTN, chol, preDM.   Left pinky finger numb with leaning her elbow on armchair.  Has had cough with some chest discomfort, feels always has cough due to nasal drip but has been last month. She is not on any medications for it. No SOB or wheezing. Some mucus with it, white occ. No fever, or chills. Worse in the AM.   Her blood pressure has been controlled at home, today their BP is BP: 118/76 She does not workout, very sporadic.  She denies chest pain, shortness of breath, dizziness.  She is not on cholesterol medication and denies myalgias. Her cholesterol is at goal. The cholesterol last visit was:   Lab Results  Component Value  Date   CHOL 200 (H) 09/26/2016   HDL 95 09/26/2016   LDLCALC 112 (H) 01/18/2016   TRIG 73 09/26/2016   CHOLHDL 2.1 09/26/2016   She has been working on diet and exercise for prediabetes, and denies polydipsia, polyuria and visual disturbances. Last A1C in the office was:  Lab Results  Component Value Date   HGBA1C 5.4 09/26/2016   Patient is on Vitamin D supplement, 5000 IU daily. Lab Results  Component Value Date   VD25OH 61 09/26/2016   She is not on a thyroid medications.  Lab Results  Component Value Date   TSH 0.34 (L) 09/26/2016   She declines depression, has a difficult time remembering names of people.  BMI is Body mass index is 24.37 kg/m., she is working on diet and exercise. Wt Readings from Last 3 Encounters:  12/30/16 144 lb 3.2 oz (65.4 kg)  09/26/16 142 lb 3.2 oz (64.5 kg)  02/15/16 142 lb (64.4 kg)     Medication Review Current Outpatient Medications on File Prior to Visit  Medication Sig Dispense Refill  . CALCIUM PO Take by mouth daily.    . Cholecalciferol (VITAMIN D PO) Take 5,000 Int'l Units by mouth daily.    . IRON PO Take by mouth daily.    Marland Kitchen latanoprost (XALATAN) 0.005 % ophthalmic solution     . TRAVATAN Z 0.004 % SOLN ophthalmic solution      No current facility-administered medications on file prior to visit.     Current Problems (verified) Patient Active Problem List   Diagnosis  Date Noted  . Pes planus 02/15/2016  . Posterior tibial tendinitis of right lower extremity 01/25/2016  . Encounter for Medicare annual wellness exam 01/18/2016  . Absolute anemia 07/20/2014  . Glaucoma 04/14/2014  . Hypothyroidism 01/05/2014  . Vitamin D deficiency 04/04/2013  . Medication management 04/04/2013  . Other abnormal glucose 01/19/2013  . GERD (gastroesophageal reflux disease)   . Hyperlipidemia   . Hypertension   . Benign neoplasm of colon 02/16/2007    Allergies No Known Allergies  SURGICAL HISTORY She  has a past surgical history  that includes Tubal ligation (Right) and Cholecystectomy. FAMILY HISTORY Her family history includes Alzheimer's disease in her father; Heart disease in her mother. SOCIAL HISTORY She  reports that she quit smoking about 42 years ago. she has never used smokeless tobacco. She reports that she does not drink alcohol or use drugs.   Objective:     Blood pressure 118/76, pulse 80, temperature (!) 97.2 F (36.2 C), resp. rate 18, height 5' 4.5" (1.638 m), weight 144 lb 3.2 oz (65.4 kg), SpO2 99 %. Body mass index is 24.37 kg/m.  General appearance: alert, no distress, WD/WN,  female HEENT: normocephalic, sclerae anicteric, TMs pearly, nares patent, no discharge or erythema, pharynx normal Oral cavity: MMM, no lesions Neck: supple, no lymphadenopathy, no thyromegaly, no masses Heart: RRR, normal S1, S2, no murmurs Lungs: CTA bilaterally, no wheezes, rhonchi, or rales Abdomen: +bs, soft, non tender, non distended, no masses, no hepatomegaly, no splenomegaly Musculoskeletal: nontender, no swelling, no obvious deformity Extremities: no edema, no cyanosis, no clubbing Skin: Seb keratosis over back and AB Pulses: 2+ symmetric, upper and lower extremities, normal cap refill Neurological: alert, oriented x 3, CN2-12 intact, strength normal upper extremities and lower extremities, sensation normal throughout, DTRs 2+ throughout, no cerebellar signs, gait normal Psychiatric: normal affect, behavior normal, pleasant    Vicie Mutters, PA-C   12/30/2016

## 2016-12-30 ENCOUNTER — Encounter: Payer: Self-pay | Admitting: Physician Assistant

## 2016-12-30 ENCOUNTER — Ambulatory Visit: Payer: Medicare HMO | Admitting: Physician Assistant

## 2016-12-30 VITALS — BP 118/76 | HR 80 | Temp 97.2°F | Resp 18 | Ht 64.5 in | Wt 144.2 lb

## 2016-12-30 DIAGNOSIS — E039 Hypothyroidism, unspecified: Secondary | ICD-10-CM

## 2016-12-30 DIAGNOSIS — E782 Mixed hyperlipidemia: Secondary | ICD-10-CM | POA: Diagnosis not present

## 2016-12-30 DIAGNOSIS — E559 Vitamin D deficiency, unspecified: Secondary | ICD-10-CM

## 2016-12-30 DIAGNOSIS — R059 Cough, unspecified: Secondary | ICD-10-CM

## 2016-12-30 DIAGNOSIS — G5692 Unspecified mononeuropathy of left upper limb: Secondary | ICD-10-CM | POA: Diagnosis not present

## 2016-12-30 DIAGNOSIS — Z79899 Other long term (current) drug therapy: Secondary | ICD-10-CM | POA: Diagnosis not present

## 2016-12-30 DIAGNOSIS — I10 Essential (primary) hypertension: Secondary | ICD-10-CM | POA: Diagnosis not present

## 2016-12-30 DIAGNOSIS — R05 Cough: Secondary | ICD-10-CM

## 2016-12-30 NOTE — Patient Instructions (Signed)
  Here are things you can do to help with this: - Try the Flonase or Nasonex. Remember to spray each nostril twice towards the outer part of your eye.  Do not sniff but instead pinch your nose and tilt your head back to help the medicine get into your sinuses.  The best time to do this is at bedtime.Stop if you get blurred vision or nose bleeds.     -Please pick one of the over the counter allergy medications below and take it once daily for allergies.  It will also help with fluid behind ear drums. Claritin or loratadine cheapest but likely the weakest  Zyrtec or certizine at night because it can make you sleepy The strongest is allegra or fexafinadine  Cheapest at [employer], [employer], [employer]   if worsening HA, changes vision/speech, imbalance, weakness go to the ER If you have fever, chills, sinus pain, SOB, wheezing call the office or go to the ER Can get CXR if you like    Google computer posture or ideas Sitting   When sitting, keep your spine neutral and your keep feet flat on the floor. Use a footrest, if necessary, and keep your thighs parallel to the floor. Avoid rounding your shoulders, and avoid tilting your head forward.  When working at a desk or a computer, keep your desk at a height where your hands are slightly lower than your elbows. Slide your chair under your desk so you are close enough to maintain good posture.  When working at a computer, place your monitor at a height where you are looking straight ahead and you do not have to tilt your head forward or downward to look at the screen. Resting When lying down and resting, avoid positions that are most painful for you. Try to support your neck in a neutral position. You can use a contour pillow or a small rolled-up towel. Your pillow should support your neck but not push on it. This information is not intended to replace advice given to you by your health care provider. Make sure you discuss any questions you have with  your health care provider. Document Released: 12/31/2004 Document Revised: 09/07/2015 Document Reviewed: 12/07/2014 Elsevier Interactive Patient Education  [employer].

## 2016-12-31 LAB — HEPATIC FUNCTION PANEL
AG RATIO: 1.3 (calc) (ref 1.0–2.5)
ALKALINE PHOSPHATASE (APISO): 57 U/L (ref 33–130)
ALT: 7 U/L (ref 6–29)
AST: 15 U/L (ref 10–35)
Albumin: 3.9 g/dL (ref 3.6–5.1)
BILIRUBIN INDIRECT: 0.4 mg/dL (ref 0.2–1.2)
BILIRUBIN TOTAL: 0.5 mg/dL (ref 0.2–1.2)
Bilirubin, Direct: 0.1 mg/dL (ref 0.0–0.2)
Globulin: 3.1 g/dL (calc) (ref 1.9–3.7)
Total Protein: 7 g/dL (ref 6.1–8.1)

## 2016-12-31 LAB — CBC WITH DIFFERENTIAL/PLATELET
BASOS PCT: 0.6 %
Basophils Absolute: 20 cells/uL (ref 0–200)
EOS ABS: 61 {cells}/uL (ref 15–500)
Eosinophils Relative: 1.8 %
HEMATOCRIT: 35 % (ref 35.0–45.0)
HEMOGLOBIN: 11.4 g/dL — AB (ref 11.7–15.5)
LYMPHS ABS: 1217 {cells}/uL (ref 850–3900)
MCH: 28.9 pg (ref 27.0–33.0)
MCHC: 32.6 g/dL (ref 32.0–36.0)
MCV: 88.6 fL (ref 80.0–100.0)
MPV: 9.6 fL (ref 7.5–12.5)
Monocytes Relative: 7.2 %
NEUTROS ABS: 1856 {cells}/uL (ref 1500–7800)
Neutrophils Relative %: 54.6 %
Platelets: 257 10*3/uL (ref 140–400)
RBC: 3.95 10*6/uL (ref 3.80–5.10)
RDW: 12.2 % (ref 11.0–15.0)
TOTAL LYMPHOCYTE: 35.8 %
WBC: 3.4 10*3/uL — ABNORMAL LOW (ref 3.8–10.8)
WBCMIX: 245 {cells}/uL (ref 200–950)

## 2016-12-31 LAB — LIPID PANEL
CHOL/HDL RATIO: 2.3 (calc) (ref <5.0)
Cholesterol: 230 mg/dL — ABNORMAL HIGH (ref <200)
HDL: 100 mg/dL (ref >50)
LDL Cholesterol (Calc): 115 mg/dL (calc) — ABNORMAL HIGH
NON-HDL CHOLESTEROL (CALC): 130 mg/dL — AB (ref <130)
TRIGLYCERIDES: 56 mg/dL (ref <150)

## 2016-12-31 LAB — BASIC METABOLIC PANEL WITH GFR
BUN / CREAT RATIO: 13 (calc) (ref 6–22)
BUN: 12 mg/dL (ref 7–25)
CO2: 34 mmol/L — ABNORMAL HIGH (ref 20–32)
CREATININE: 0.94 mg/dL — AB (ref 0.60–0.93)
Calcium: 9.5 mg/dL (ref 8.6–10.4)
Chloride: 103 mmol/L (ref 98–110)
GFR, EST NON AFRICAN AMERICAN: 59 mL/min/{1.73_m2} — AB (ref >=60)
GFR, Est African American: 68 mL/min/{1.73_m2} (ref >=60)
Glucose, Bld: 90 mg/dL (ref 65–99)
Potassium: 4.3 mmol/L (ref 3.5–5.3)
SODIUM: 141 mmol/L (ref 135–146)

## 2016-12-31 LAB — TSH: TSH: 0.68 mIU/L (ref 0.40–4.50)

## 2016-12-31 NOTE — Progress Notes (Signed)
Pt aware of lab results & voiced understanding of those results.

## 2017-03-27 ENCOUNTER — Ambulatory Visit: Payer: Self-pay | Admitting: Physician Assistant

## 2017-04-08 NOTE — Progress Notes (Signed)
This  77 y.o. DBF presents for 6 month follow up with labile HTN, HLD, Pre-Diabetes and Vitamin D Deficiency. Patient had an episode of Thyroiditis in 2011 and recovered & is since followed expectantly. She is c/o Rt posterolateral neck pains associated with sleeping on her rt side. Also, she occasional has some discomfort in her Rt shoulder.      Patient is followed expectantly for labile HTN & BP has been controlled at home. Today's BP is at goal - 118/66. Patient has had no complaints of any cardiac type chest pain, palpitations, dyspnea / orthopnea / PND, dizziness, claudication, or dependent edema.     Hyperlipidemia is not controlled with diet & patient is reticent to take meds for cholesterol.  Last Lipids were not at goal: Lab Results  Component Value Date   CHOL 230 (H) 12/30/2016   HDL 100 12/30/2016   LDLCALC 115 (H) 12/30/2016   TRIG 56 12/30/2016   CHOLHDL 2.3 12/30/2016      Also, the patient has history of PreDiabetes (A1c 5.9% /2015) and has had no symptoms of reactive hypoglycemia, diabetic polys, paresthesias or visual blurring.  Last A1c was Normal & at goal: Lab Results  Component Value Date   HGBA1C 5.4 09/26/2016      Further, the patient also has history of Vitamin D Deficiency ("38"/2015)  and supplements vitamin D without any suspected side-effects. Last vitamin D was still low:  Lab Results  Component Value Date   VD25OH 61 09/26/2016   Current Outpatient Medications on File Prior to Visit  Medication Sig  . CALCIUM PO Take by mouth daily.  . Cholecalciferol (VITAMIN D PO) Take 5,000 Int'l Units by mouth daily.  . IRON PO Take by mouth daily.  Marland Kitchen latanoprost (XALATAN) 0.005 % ophthalmic solution    No current facility-administered medications on file prior to visit.    No Known Allergies   PMHx:   Past Medical History:  Diagnosis Date  . Anemia   . GERD (gastroesophageal reflux disease)   . Glaucoma   . Hyperlipidemia   . Hypertension   .  Thyroid disease    Immunization History  Administered Date(s) Administered  . Td 06/30/2012   Past Surgical History:  Procedure Laterality Date  . CHOLECYSTECTOMY    . TUBAL LIGATION Right    FHx:    Reviewed / unchanged  SHx:    Reviewed / unchanged  Systems Review:  Constitutional: Denies fever, chills, wt changes, headaches, insomnia, fatigue, night sweats, change in appetite. Eyes: Denies redness, blurred vision, diplopia, discharge, itchy, watery eyes.  ENT: Denies discharge, congestion, post nasal drip, epistaxis, sore throat, earache, hearing loss, dental pain, tinnitus, vertigo, sinus pain, snoring.  CV: Denies chest pain, palpitations, irregular heartbeat, syncope, dyspnea, diaphoresis, orthopnea, PND, claudication or edema. Respiratory: denies cough, dyspnea, DOE, pleurisy, hoarseness, laryngitis, wheezing.  Gastrointestinal: Denies dysphagia, odynophagia, heartburn, reflux, water brash, abdominal pain or cramps, nausea, vomiting, bloating, diarrhea, constipation, hematemesis, melena, hematochezia  or hemorrhoids. Genitourinary: Denies dysuria, frequency, urgency, nocturia, hesitancy, discharge, hematuria or flank pain. Musculoskeletal: Denies arthralgias, myalgias, stiffness, jt. swelling, pain, limping or strain/sprain.  Skin: Denies pruritus, rash, hives, warts, acne, eczema or change in skin lesion(s). Neuro: No weakness, tremor, incoordination, spasms, paresthesia or pain. Psychiatric: Denies confusion, memory loss or sensory loss. Endo: Denies change in weight, skin or hair change.  Heme/Lymph: No excessive bleeding, bruising or enlarged lymph nodes.  Physical Exam  BP 118/66   Pulse 68  Temp 97.6 F (36.4 C)   Resp 16   Ht 5' 4.5" (1.638 m)   Wt 140 lb 12.8 oz (63.9 kg)   BMI 23.80 kg/m   Appears  well nourished, well groomed  and in no distress.  Eyes: PERRLA, EOMs, conjunctiva no swelling or erythema. Sinuses: No frontal/maxillary  tenderness ENT/Mouth: EAC's clear, TM's nl w/o erythema, bulging. Nares clear w/o erythema, swelling, exudates. Oropharynx clear without erythema or exudates. Oral hygiene is good. Tongue normal, non obstructing. Hearing intact.  Neck: Supple.  Sl tender low Rt para cervical muscles. Thyroid not palpable. Car 2+/2+ without bruits, nodes or JVD. Chest: Respirations nl with BS clear & equal w/o rales, rhonchi, wheezing or stridor.  Cor: Heart sounds normal w/ regular rate and rhythm without sig. murmurs, gallops, clicks or rubs. Peripheral pulses normal and equal  without edema.  Abdomen: Soft & bowel sounds normal. Non-tender w/o guarding, rebound, hernias, masses or organomegaly.  Lymphatics: Unremarkable.  Musculoskeletal: Full ROM all peripheral extremities, joint stability, 5/5 strength and normal gait. Sl tender Rt shoulder at anterior joint line.  Skin: Warm, dry without exposed rashes, lesions or ecchymosis apparent.  Neuro: Cranial nerves intact, reflexes equal bilaterally. Sensory-motor testing grossly intact. Tendon reflexes grossly intact.  Pysch: Alert & oriented x 3.  Insight and judgement nl & appropriate. No ideations.  Assessment and Plan:  1. Essential hypertension  - Continue medication, monitor blood pressure at home.  - Continue DASH diet.  Reminder to go to the ER if any CP,  SOB, nausea, dizziness, severe HA, changes vision/speech.  - CBC with Differential/Platelet - BASIC METABOLIC PANEL WITH GFR - Magnesium - TSH  2. Hyperlipidemia, mixed  - Continue diet/meds, exercise,& lifestyle modifications.  - Continue monitor periodic cholesterol/liver & renal functions   - Hepatic function panel - Lipid panel - TSH  3. Abnormal glucose  - Continue diet, exercise, lifestyle modifications.  - Monitor appropriate labs.  - Hemoglobin A1c - Insulin, random  4. Vitamin D deficiency  - Continue supplementation.  - VITAMIN D 25 Hydroxyl  5. Prediabetes  -  Hemoglobin A1c - Insulin, random  6. Hypothyroidism, occult  - TSH  7. Gastroesophageal reflux disease  - CBC with Differential/Platelet  8. Medication management  - CBC with Differential/Platelet - BASIC METABOLIC PANEL WITH GFR - Hepatic function panel - Magnesium - Lipid panel - TSH - Hemoglobin A1c - Insulin, random - VITAMIN D 25 Hydroxyl           Discussed  regular exercise, BP monitoring, weight control to achieve/maintain BMI less than 25 and discussed med and SE's. Recommended labs to assess and monitor clinical status with further disposition pending results of labs. Over 30 minutes of exam, counseling, chart review was performed.

## 2017-04-08 NOTE — Patient Instructions (Signed)

## 2017-04-09 ENCOUNTER — Encounter: Payer: Self-pay | Admitting: Internal Medicine

## 2017-04-09 ENCOUNTER — Ambulatory Visit (INDEPENDENT_AMBULATORY_CARE_PROVIDER_SITE_OTHER): Payer: PPO | Admitting: Internal Medicine

## 2017-04-09 VITALS — BP 118/66 | HR 68 | Temp 97.6°F | Resp 16 | Ht 64.5 in | Wt 140.8 lb

## 2017-04-09 DIAGNOSIS — Z79899 Other long term (current) drug therapy: Secondary | ICD-10-CM | POA: Diagnosis not present

## 2017-04-09 DIAGNOSIS — K219 Gastro-esophageal reflux disease without esophagitis: Secondary | ICD-10-CM | POA: Diagnosis not present

## 2017-04-09 DIAGNOSIS — R7303 Prediabetes: Secondary | ICD-10-CM | POA: Diagnosis not present

## 2017-04-09 DIAGNOSIS — I10 Essential (primary) hypertension: Secondary | ICD-10-CM | POA: Diagnosis not present

## 2017-04-09 DIAGNOSIS — R7309 Other abnormal glucose: Secondary | ICD-10-CM

## 2017-04-09 DIAGNOSIS — E782 Mixed hyperlipidemia: Secondary | ICD-10-CM

## 2017-04-09 DIAGNOSIS — E559 Vitamin D deficiency, unspecified: Secondary | ICD-10-CM

## 2017-04-09 DIAGNOSIS — E039 Hypothyroidism, unspecified: Secondary | ICD-10-CM

## 2017-04-10 LAB — HEMOGLOBIN A1C
HEMOGLOBIN A1C: 5.4 %{Hb} (ref <5.7)
MEAN PLASMA GLUCOSE: 108 (calc)
eAG (mmol/L): 6 (calc)

## 2017-04-10 LAB — BASIC METABOLIC PANEL WITH GFR
BUN / CREAT RATIO: 13 (calc) (ref 6–22)
BUN: 13 mg/dL (ref 7–25)
CHLORIDE: 104 mmol/L (ref 98–110)
CO2: 31 mmol/L (ref 20–32)
Calcium: 9.4 mg/dL (ref 8.6–10.4)
Creat: 1 mg/dL — ABNORMAL HIGH (ref 0.60–0.93)
GFR, EST AFRICAN AMERICAN: 63 mL/min/{1.73_m2} (ref >=60)
GFR, EST NON AFRICAN AMERICAN: 55 mL/min/{1.73_m2} — AB (ref >=60)
Glucose, Bld: 59 mg/dL — ABNORMAL LOW (ref 65–99)
POTASSIUM: 3.8 mmol/L (ref 3.5–5.3)
Sodium: 140 mmol/L (ref 135–146)

## 2017-04-10 LAB — HEPATIC FUNCTION PANEL
AG Ratio: 1.4 (calc) (ref 1.0–2.5)
ALBUMIN MSPROF: 4.2 g/dL (ref 3.6–5.1)
ALT: 8 U/L (ref 6–29)
AST: 16 U/L (ref 10–35)
Alkaline phosphatase (APISO): 59 U/L (ref 33–130)
BILIRUBIN DIRECT: 0.1 mg/dL (ref 0.0–0.2)
BILIRUBIN INDIRECT: 0.4 mg/dL (ref 0.2–1.2)
GLOBULIN: 3 g/dL (ref 1.9–3.7)
Total Bilirubin: 0.5 mg/dL (ref 0.2–1.2)
Total Protein: 7.2 g/dL (ref 6.1–8.1)

## 2017-04-10 LAB — LIPID PANEL
Cholesterol: 219 mg/dL — ABNORMAL HIGH (ref <200)
HDL: 93 mg/dL (ref >50)
LDL CHOLESTEROL (CALC): 112 mg/dL — AB
NON-HDL CHOLESTEROL (CALC): 126 mg/dL (ref <130)
TRIGLYCERIDES: 58 mg/dL (ref <150)
Total CHOL/HDL Ratio: 2.4 (calc) (ref <5.0)

## 2017-04-10 LAB — CBC WITH DIFFERENTIAL/PLATELET
Basophils Absolute: 20 cells/uL (ref 0–200)
Basophils Relative: 0.6 %
EOS PCT: 2 %
Eosinophils Absolute: 68 cells/uL (ref 15–500)
HCT: 34.4 % — ABNORMAL LOW (ref 35.0–45.0)
Hemoglobin: 11.3 g/dL — ABNORMAL LOW (ref 11.7–15.5)
Lymphs Abs: 1241 cells/uL (ref 850–3900)
MCH: 29.2 pg (ref 27.0–33.0)
MCHC: 32.8 g/dL (ref 32.0–36.0)
MCV: 88.9 fL (ref 80.0–100.0)
MPV: 9.9 fL (ref 7.5–12.5)
Monocytes Relative: 7 %
NEUTROS PCT: 53.9 %
Neutro Abs: 1833 cells/uL (ref 1500–7800)
PLATELETS: 242 10*3/uL (ref 140–400)
RBC: 3.87 10*6/uL (ref 3.80–5.10)
RDW: 12.4 % (ref 11.0–15.0)
TOTAL LYMPHOCYTE: 36.5 %
WBC mixed population: 238 cells/uL (ref 200–950)
WBC: 3.4 10*3/uL — AB (ref 3.8–10.8)

## 2017-04-10 LAB — VITAMIN D 25 HYDROXY (VIT D DEFICIENCY, FRACTURES): VIT D 25 HYDROXY: 41 ng/mL (ref 30–100)

## 2017-04-10 LAB — INSULIN, RANDOM: Insulin: 9.4 u[IU]/mL (ref 2.0–19.6)

## 2017-04-10 LAB — MAGNESIUM: MAGNESIUM: 2.1 mg/dL (ref 1.5–2.5)

## 2017-04-10 LAB — TSH: TSH: 0.74 m[IU]/L (ref 0.40–4.50)

## 2017-05-15 DIAGNOSIS — H40003 Preglaucoma, unspecified, bilateral: Secondary | ICD-10-CM | POA: Diagnosis not present

## 2017-06-16 DIAGNOSIS — H2513 Age-related nuclear cataract, bilateral: Secondary | ICD-10-CM | POA: Diagnosis not present

## 2017-06-16 DIAGNOSIS — H2511 Age-related nuclear cataract, right eye: Secondary | ICD-10-CM | POA: Diagnosis not present

## 2017-06-16 DIAGNOSIS — Z01818 Encounter for other preprocedural examination: Secondary | ICD-10-CM | POA: Diagnosis not present

## 2017-06-16 DIAGNOSIS — H25811 Combined forms of age-related cataract, right eye: Secondary | ICD-10-CM | POA: Diagnosis not present

## 2017-06-16 HISTORY — PX: CATARACT EXTRACTION: SUR2

## 2017-07-18 ENCOUNTER — Ambulatory Visit: Payer: Self-pay | Admitting: Adult Health

## 2017-08-01 NOTE — Progress Notes (Signed)
MEDICARE ANNUAL WELLNESS VISIT AND 3 month OV  Assessment:    Encounter for Medicare annual wellness exam  Essential hypertension - continue medications, DASH diet, exercise and monitor at home. Call if greater than 130/80.  - CBC with Differential/Platelet - CMP/GFR  Other abnormal glucose Recent A1Cs at goal Discussed diet/exercise, weight management  Defer A1C; check CMP   Hypothyroidism, unspecified hypothyroidism type - TSH Hypothyroidism-check TSH level, continue medications the same, reminded to take on an empty stomach 30-27mins before food.    Glaucoma Continue to follow up with eye doctor  Vitamin D deficiency - Vit D  25 hydroxy (rtn osteoporosis monitoring)   Medication management - Magnesium  Hyperlipidemia -check lipids, decrease fatty foods, increase activity.  - Lipid panel  Gastroesophageal reflux disease with esophagitis Continue PPI/H2 blocker, diet discussed  Benign neoplasm of colon Colonoscopy due 09/2017  Absolute anemia Monitor, continue supplements - CBC  Pes Planus R foot, Followed by ortho; improved after boot   Future Appointments  Date Time Provider [place]  10/24/2017 11:00 AM Vicie Mutters, PA-C [facility] None     Plan:   During the course of the visit the patient was educated and counseled about appropriate screening and preventive services including:    Pneumococcal vaccine   Influenza vaccine  Td vaccine  Screening electrocardiogram  Screening mammography  Bone densitometry screening  Colorectal cancer screening  Diabetes screening  Glaucoma screening  Nutrition counseling   Advanced directives: given information/requested   Subjective:   Robin Zimmerman is a 77 y.o. female who presents for Medicare Annual Wellness Visit and 3 month follow up for HTN, chol, glucose management, vitamin D. She recently had R cataract done, pending left.   BMI is Body mass index is 24 kg/m., she has  been working on diet and exercise. Wt Readings from Last 3 Encounters:  08/04/17 142 lb (64.4 kg)  04/09/17 140 lb 12.8 oz (63.9 kg)  12/30/16 144 lb 3.2 oz (65.4 kg)   Her blood pressure has been controlled at home, today their BP is BP: 130/74 She does not workout, very sporadic.  She denies chest pain, shortness of breath, dizziness.   She is not on cholesterol medication and denies myalgias. Her cholesterol is not at goal. The cholesterol last visit was:   Lab Results  Component Value Date   CHOL 219 (H) 04/09/2017   HDL 93 04/09/2017   LDLCALC 112 (H) 04/09/2017   TRIG 58 04/09/2017   CHOLHDL 2.4 04/09/2017   She has been working on diet and exercise for glucose management, and denies polydipsia, polyuria and visual disturbances. Last A1C in the office was:  Lab Results  Component Value Date   HGBA1C 5.4 04/09/2017   Patient is on Vitamin D supplement, 10000 IU daily. Lab Results  Component Value Date   VD25OH 41 04/09/2017     Medication Review Current Outpatient Medications on File Prior to Visit  Medication Sig Dispense Refill  . CALCIUM PO Take by mouth daily.    . Cholecalciferol (VITAMIN D PO) Take 5,000 Int'l Units by mouth daily.    . IRON PO Take by mouth daily.    Marland Kitchen latanoprost (XALATAN) 0.005 % ophthalmic solution      No current facility-administered medications on file prior to visit.     Current Problems (verified) Patient Active Problem List   Diagnosis Date Noted  . Pes planus 02/15/2016  . Posterior tibial tendinitis of right lower extremity 01/25/2016  . Encounter for Medicare  annual wellness exam 01/18/2016  . Absolute anemia 07/20/2014  . Glaucoma 04/14/2014  . Vitamin D deficiency 04/04/2013  . Medication management 04/04/2013  . Other abnormal glucose 01/19/2013  . GERD (gastroesophageal reflux disease)   . Hyperlipidemia   . Hypertension   . Benign neoplasm of colon 02/16/2007    Screening Tests Immunization History  Administered  Date(s) Administered  . Td 06/30/2012    Preventative care: Last colonoscopy: 2014 due 09/2017 Last mammogram: 11/2015 - gets q2y Last pap smear/pelvic exam: 2012   DEXA: 2017  Prior vaccinations: TD or Tdap: 2014 Influenza: declines Prevnar 13 Declines Pneumococcal: declines Shingles/Zostavax: declines  Names of Other Physician/Practitioners you currently use: 1. [facility]- here for primary care 2. Dr. Carollee Sires, [facility], eye doctor, last visit 2019 3. , dental, last visit - remote, encouraged to follow up   Patient Care Team: Unk Pinto, MD as PCP - General (Internal Medicine) Juanita Craver, MD as Consulting Physician (Gastroenterology)   Allergies No Known Allergies  SURGICAL HISTORY She  has a past surgical history that includes Tubal ligation; Cholecystectomy; and Cataract extraction (Right, 06/16/2017). FAMILY HISTORY Her family history includes Alzheimer's disease in her father; Heart disease in her mother. SOCIAL HISTORY She  reports that she quit smoking about 42 years ago. She has never used smokeless tobacco. She reports that she does not drink alcohol or use drugs.  MEDICARE WELLNESS OBJECTIVES: Physical activity: no regular exercise Depression/mood screen:   Depression screen [facility] 2/9 08/04/2017  Decreased Interest 0  Down, Depressed, Hopeless 0  PHQ - 2 Score 0   ADLs:  In your present state of health, do you have any difficulty performing the following activities: 04/09/2017 09/26/2016  Hearing? N N  Vision? N N  Difficulty concentrating or making decisions? N N  Walking or climbing stairs? N N  Dressing or bathing? N N  Doing errands, shopping? N N  Some recent data might be hidden    Cognitive Testing  Alert? Yes  Normal Appearance?Yes  Oriented to person? Yes  Place? Yes   Time? Yes  Recall of three objects?  Yes  Can perform simple calculations? Yes  Displays appropriate judgment?Yes  Can  read the correct time from a watch face?Yes  EOL planning: Does Patient Have a Medical Advance Directive?: No Would patient like information on creating a medical advance directive?: No - Patient declined    Objective:     Blood pressure 130/74, pulse 70, temperature 97.7 F (36.5 C), height 5' 4.5" (1.638 m), weight 142 lb (64.4 kg), SpO2 98 %. Body mass index is 24 kg/m.  General appearance: alert, no distress, WD/WN,  female HEENT: normocephalic, sclerae anicteric, TMs pearly, nares patent, no discharge or erythema, pharynx normal Oral cavity: MMM, no lesions Neck: supple, no lymphadenopathy, no thyromegaly, no masses Heart: RRR, normal S1, S2, no murmurs Lungs: CTA bilaterally, no wheezes, rhonchi, or rales Abdomen: +bs, soft, non tender, non distended, no masses, no hepatomegaly, no splenomegaly Musculoskeletal: nontender, no swelling, no obvious deformity, right foot without deformity, no pain with plantar or dorsal flexion but + pain with rotation of ankle, pain with palpation at hallucis extensor or tibialis anterior to palpation.  Extremities: no edema, no cyanosis, no clubbing Skin: Seb keratosis over back and AB Pulses: 2+ symmetric, upper and lower extremities, normal cap refill Neurological: alert, oriented x 3, CN2-12 intact, strength normal upper extremities and lower extremities, sensation normal throughout, DTRs 2+ throughout, no cerebellar signs, gait  normal Psychiatric: normal affect, behavior normal, pleasant  Breast:  nontender, with several mobile round lumps, no skin changes, no nipple discharge or inversion, no axillary lymphadenopathy Gyn: defer  Rectal: defer  Medicare Attestation I have personally reviewed: The patient's medical and social history Their use of alcohol, tobacco or illicit drugs Their current medications and supplements The patient's functional ability including ADLs,fall risks, home safety risks, cognitive, and hearing and visual  impairment Diet and physical activities Evidence for depression or mood disorders  The patient's weight, height, BMI, and visual acuity have been recorded in the chart.  I have made referrals, counseling, and provided education to the patient based on review of the above and I have provided the patient with a written personalized care plan for preventive services.     Izora Ribas, NP   08/04/2017

## 2017-08-04 ENCOUNTER — Ambulatory Visit (INDEPENDENT_AMBULATORY_CARE_PROVIDER_SITE_OTHER): Payer: PPO | Admitting: Adult Health

## 2017-08-04 ENCOUNTER — Encounter: Payer: Self-pay | Admitting: Adult Health

## 2017-08-04 VITALS — BP 130/74 | HR 70 | Temp 97.7°F | Ht 64.5 in | Wt 142.0 lb

## 2017-08-04 DIAGNOSIS — D649 Anemia, unspecified: Secondary | ICD-10-CM

## 2017-08-04 DIAGNOSIS — M2142 Flat foot [pes planus] (acquired), left foot: Secondary | ICD-10-CM

## 2017-08-04 DIAGNOSIS — E559 Vitamin D deficiency, unspecified: Secondary | ICD-10-CM | POA: Diagnosis not present

## 2017-08-04 DIAGNOSIS — I10 Essential (primary) hypertension: Secondary | ICD-10-CM

## 2017-08-04 DIAGNOSIS — H409 Unspecified glaucoma: Secondary | ICD-10-CM | POA: Diagnosis not present

## 2017-08-04 DIAGNOSIS — E785 Hyperlipidemia, unspecified: Secondary | ICD-10-CM | POA: Diagnosis not present

## 2017-08-04 DIAGNOSIS — R7309 Other abnormal glucose: Secondary | ICD-10-CM | POA: Diagnosis not present

## 2017-08-04 DIAGNOSIS — D126 Benign neoplasm of colon, unspecified: Secondary | ICD-10-CM | POA: Diagnosis not present

## 2017-08-04 DIAGNOSIS — Z6823 Body mass index (BMI) 23.0-23.9, adult: Secondary | ICD-10-CM

## 2017-08-04 DIAGNOSIS — K219 Gastro-esophageal reflux disease without esophagitis: Secondary | ICD-10-CM

## 2017-08-04 DIAGNOSIS — Z79899 Other long term (current) drug therapy: Secondary | ICD-10-CM | POA: Diagnosis not present

## 2017-08-04 DIAGNOSIS — Z0000 Encounter for general adult medical examination without abnormal findings: Secondary | ICD-10-CM

## 2017-08-04 DIAGNOSIS — R6889 Other general symptoms and signs: Secondary | ICD-10-CM

## 2017-08-04 DIAGNOSIS — Z0001 Encounter for general adult medical examination with abnormal findings: Secondary | ICD-10-CM | POA: Diagnosis not present

## 2017-08-04 DIAGNOSIS — M2141 Flat foot [pes planus] (acquired), right foot: Secondary | ICD-10-CM

## 2017-08-04 NOTE — Patient Instructions (Signed)
Colonoscopy follow up due September 2019  Look up local senior centers and give them a call to see if there are any programs you might be interested in   Aim for 7+ servings of fruits and vegetables daily  80+ fluid ounces of water or unsweet tea for healthy kidneys  Limit animal fats in diet for cholesterol and heart health - choose grass fed whenever available  Aim for low stress - take time to unwind and care for your mental health  Aim for 150 min of moderate intensity exercise weekly for heart health, and weights twice weekly for bone health  Aim for 7-9 hours of sleep daily     When it comes to diets, agreement about the perfect plan isn't easy to find, even among the experts. Experts at the [employer] developed an idea known as the Healthy Eating Plate. Just imagine a plate divided into logical, healthy portions.  The emphasis is on diet quality:  Load up on vegetables and fruits - one-half of your plate: Aim for color and variety, and remember that potatoes don't count.  Go for whole grains - one-quarter of your plate: Whole wheat, barley, wheat berries, quinoa, oats, brown rice, and foods made with them. If you want pasta, go with whole wheat pasta.  Protein power - one-quarter of your plate: Fish, chicken, beans, and nuts are all healthy, versatile protein sources. Limit red meat.  The diet, however, does go beyond the plate, offering a few other suggestions.  Use healthy plant oils, such as olive, canola, soy, corn, sunflower and peanut. Check the labels, and avoid partially hydrogenated oil, which have unhealthy trans fats.  If you're thirsty, drink water. Coffee and tea are good in moderation, but skip sugary drinks and limit milk and dairy products to one or two daily servings.  The type of carbohydrate in the diet is more important than the amount. Some sources of carbohydrates, such as vegetables, fruits, whole grains, and beans-are healthier  than others.  Finally, stay active.

## 2017-08-05 LAB — COMPLETE METABOLIC PANEL WITH GFR
AG RATIO: 1.3 (calc) (ref 1.0–2.5)
ALT: 8 U/L (ref 6–29)
AST: 14 U/L (ref 10–35)
Albumin: 4.2 g/dL (ref 3.6–5.1)
Alkaline phosphatase (APISO): 60 U/L (ref 33–130)
BUN/Creatinine Ratio: 15 (calc) (ref 6–22)
BUN: 15 mg/dL (ref 7–25)
CALCIUM: 9.5 mg/dL (ref 8.6–10.4)
CO2: 32 mmol/L (ref 20–32)
Chloride: 103 mmol/L (ref 98–110)
Creat: 1.02 mg/dL — ABNORMAL HIGH (ref 0.60–0.93)
GFR, EST AFRICAN AMERICAN: 62 mL/min/{1.73_m2} (ref >=60)
GFR, Est Non African American: 53 mL/min/{1.73_m2} — ABNORMAL LOW (ref >=60)
Globulin: 3.2 g/dL (calc) (ref 1.9–3.7)
Glucose, Bld: 92 mg/dL (ref 65–99)
POTASSIUM: 4.5 mmol/L (ref 3.5–5.3)
Sodium: 140 mmol/L (ref 135–146)
TOTAL PROTEIN: 7.4 g/dL (ref 6.1–8.1)
Total Bilirubin: 0.5 mg/dL (ref 0.2–1.2)

## 2017-08-05 LAB — CBC WITH DIFFERENTIAL/PLATELET
BASOS PCT: 0.7 %
Basophils Absolute: 20 cells/uL (ref 0–200)
EOS PCT: 1.7 %
Eosinophils Absolute: 49 cells/uL (ref 15–500)
HEMATOCRIT: 34.3 % — AB (ref 35.0–45.0)
HEMOGLOBIN: 11.1 g/dL — AB (ref 11.7–15.5)
LYMPHS ABS: 1340 {cells}/uL (ref 850–3900)
MCH: 29.1 pg (ref 27.0–33.0)
MCHC: 32.4 g/dL (ref 32.0–36.0)
MCV: 90 fL (ref 80.0–100.0)
MPV: 9.9 fL (ref 7.5–12.5)
Monocytes Relative: 6.6 %
NEUTROS ABS: 1299 {cells}/uL — AB (ref 1500–7800)
Neutrophils Relative %: 44.8 %
Platelets: 218 10*3/uL (ref 140–400)
RBC: 3.81 10*6/uL (ref 3.80–5.10)
RDW: 12.4 % (ref 11.0–15.0)
Total Lymphocyte: 46.2 %
WBC: 2.9 10*3/uL — AB (ref 3.8–10.8)
WBCMIX: 191 {cells}/uL — AB (ref 200–950)

## 2017-08-05 LAB — LIPID PANEL
CHOL/HDL RATIO: 2.6 (calc) (ref <5.0)
Cholesterol: 236 mg/dL — ABNORMAL HIGH (ref <200)
HDL: 92 mg/dL (ref >50)
LDL CHOLESTEROL (CALC): 127 mg/dL — AB
Non-HDL Cholesterol (Calc): 144 mg/dL (calc) — ABNORMAL HIGH (ref <130)
Triglycerides: 75 mg/dL (ref <150)

## 2017-08-05 LAB — TSH: TSH: 0.47 m[IU]/L (ref 0.40–4.50)

## 2017-08-05 LAB — VITAMIN D 25 HYDROXY (VIT D DEFICIENCY, FRACTURES): Vit D, 25-Hydroxy: 48 ng/mL (ref 30–100)

## 2017-10-07 ENCOUNTER — Encounter: Payer: Self-pay | Admitting: Physician Assistant

## 2017-10-23 DIAGNOSIS — H40003 Preglaucoma, unspecified, bilateral: Secondary | ICD-10-CM | POA: Diagnosis not present

## 2017-10-24 ENCOUNTER — Ambulatory Visit (INDEPENDENT_AMBULATORY_CARE_PROVIDER_SITE_OTHER): Payer: PPO | Admitting: Physician Assistant

## 2017-10-24 ENCOUNTER — Encounter: Payer: Self-pay | Admitting: Physician Assistant

## 2017-10-24 VITALS — BP 110/70 | HR 68 | Temp 97.3°F | Resp 12 | Ht 64.5 in | Wt 142.8 lb

## 2017-10-24 DIAGNOSIS — Z0000 Encounter for general adult medical examination without abnormal findings: Secondary | ICD-10-CM

## 2017-10-24 DIAGNOSIS — E559 Vitamin D deficiency, unspecified: Secondary | ICD-10-CM

## 2017-10-24 DIAGNOSIS — I10 Essential (primary) hypertension: Secondary | ICD-10-CM

## 2017-10-24 DIAGNOSIS — Z136 Encounter for screening for cardiovascular disorders: Secondary | ICD-10-CM | POA: Diagnosis not present

## 2017-10-24 DIAGNOSIS — Z0001 Encounter for general adult medical examination with abnormal findings: Secondary | ICD-10-CM

## 2017-10-24 DIAGNOSIS — E785 Hyperlipidemia, unspecified: Secondary | ICD-10-CM | POA: Diagnosis not present

## 2017-10-24 DIAGNOSIS — H409 Unspecified glaucoma: Secondary | ICD-10-CM

## 2017-10-24 DIAGNOSIS — Z79899 Other long term (current) drug therapy: Secondary | ICD-10-CM

## 2017-10-24 DIAGNOSIS — M2142 Flat foot [pes planus] (acquired), left foot: Secondary | ICD-10-CM

## 2017-10-24 DIAGNOSIS — M2141 Flat foot [pes planus] (acquired), right foot: Secondary | ICD-10-CM

## 2017-10-24 DIAGNOSIS — R7309 Other abnormal glucose: Secondary | ICD-10-CM

## 2017-10-24 DIAGNOSIS — D126 Benign neoplasm of colon, unspecified: Secondary | ICD-10-CM

## 2017-10-24 DIAGNOSIS — K219 Gastro-esophageal reflux disease without esophagitis: Secondary | ICD-10-CM

## 2017-10-24 DIAGNOSIS — D649 Anemia, unspecified: Secondary | ICD-10-CM

## 2017-10-24 NOTE — Patient Instructions (Addendum)
HOW TO SCHEDULE A MAMMOGRAM  [facility]  7 a.m.-6:30 p.m., Monday 7 a.m.-5 p.m., Tuesday-Friday Schedule an appointment by calling [phone].   COLONOSCOPY PLEASE CALL TO GET THIS Colon cancer is 3rd most diagnosed cancer and 2nd leading cause of death in both men and women 77 years of age and older despite being one of the most preventable and treatable cancers if found early.  4 of out 5 people diagnosed with colon cancer have NO prior family history.  When caught EARLY 90% of colon cancer is curable.   Your LDL could improve, ideally we want it under a 100.  Your LDL is the bad cholesterol that can lead to heart attack and stroke. To lower your number you can decrease your fatty foods, red meat, cheese, milk and increase fiber like whole grains and veggies. You can also add a fiber supplement like Citracel or Benefiber, these do not cause gas and bloating and are safe to use. Especially if you have a strong family history of heart disease or stroke or you have evidence of plaque on any imaging like a chest xray, we may discuss at your next office visit putting you on a medication to get your number below 100.   Use a dropper or use a cap to put peroxide, olive oil,mineral oil or canola oil in the effected ear- 2-3 times a week. Let it soak for 20-30 min then you can take a shower or use a baby bulb with warm water to wash out the ear wax.  Do not use Qtips   Look up vionic shoes Do water bottle frozen and roll your foot on it Get inserts for your plantar fasciitis Do stretches at night we discussed If it is not better in 6-8 weeks we can do an injection   Plantar Fasciitis  Plantar fasciitis is a painful foot condition that affects the heel. It occurs when the band of tissue that connects the toes to the heel bone (plantar fascia) becomes irritated. This can happen after exercising too much or doing other repetitive activities (overuse injury). The  pain from plantar fasciitis can range from mild irritation to severe pain that makes it difficult for you to walk or move. The pain is usually worse in the morning or after you have been sitting or lying down for a while. What are the causes? This condition may be caused by:  Standing for long periods of time.  Wearing shoes that do not fit.  Doing high-impact activities, including running, aerobics, and ballet.  Being overweight.  Having an abnormal way of walking (gait).  Having tight calf muscles.  Having high arches in your feet.  Starting a new athletic activity.  What are the signs or symptoms? The main symptom of this condition is heel pain. Other symptoms include:  Pain that gets worse after activity or exercise.  Pain that is worse in the morning or after resting.  Pain that goes away after you walk for a few minutes.  How is this diagnosed? This condition may be diagnosed based on your signs and symptoms. Your health care provider will also do a physical exam to check for:  A tender area on the bottom of your foot.  A high arch in your foot.  Pain when you move your foot.  Difficulty moving your foot.  You may also need to have imaging studies to confirm the diagnosis. These can include:  X-rays.  Ultrasound.  MRI.  How is this treated? Treatment for plantar fasciitis depends on the severity of the condition. Your treatment may include:  Rest, ice, and over-the-counter pain medicines to manage your pain.  Exercises to stretch your calves and your plantar fascia.  A splint that holds your foot in a stretched, upward position while you sleep (night splint).  Physical therapy to relieve symptoms and prevent problems in the future.  Cortisone injections to relieve severe pain.  Extracorporeal shock wave therapy (ESWT) to stimulate damaged plantar fascia with electrical impulses. It is often used as a last resort before surgery.  Surgery, if other  treatments have not worked after 12 months.  Follow these instructions at home:  Take medicines only as directed by your health care provider.  Avoid activities that cause pain.  Roll the bottom of your foot over a bag of ice or a bottle of cold water. Do this for 20 minutes, 3-4 times a day.  Perform simple stretches as directed by your health care provider.  Try wearing athletic shoes with air-sole or gel-sole cushions or soft shoe inserts.  Wear a night splint while sleeping, if directed by your health care provider.  Keep all follow-up appointments with your health care provider. How is this prevented?  Do not perform exercises or activities that cause heel pain.  Consider finding low-impact activities if you continue to have problems.  Lose weight if you need to. The best way to prevent plantar fasciitis is to avoid the activities that aggravate your plantar fascia. Contact a health care provider if:  Your symptoms do not go away after treatment with home care measures.  Your pain gets worse.  Your pain affects your ability to move or do your daily activities. This information is not intended to replace advice given to you by your health care provider. Make sure you discuss any questions you have with your health care provider. Document Released: 09/25/2000 Document Revised: 06/05/2015 Document Reviewed: 11/10/2013 Elsevier Interactive Patient Education  [employer].

## 2017-10-24 NOTE — Progress Notes (Signed)
CPE AND 3 month OV  Assessment:   Essential hypertension - continue medications, DASH diet, exercise and monitor at home. Call if greater than 130/80.  - CBC with Differential/Platelet - CMP/GFR  Other abnormal glucose Recent A1Cs at goal Discussed diet/exercise, weight management  Defer A1C; check CMP   Hypothyroidism, unspecified hypothyroidism type - TSH Hypothyroidism-check TSH level, continue medications the same, reminded to take on an empty stomach 30-42mins before food.    Glaucoma Continue to follow up with eye doctor  Vitamin D deficiency - Vit D  25 hydroxy (rtn osteoporosis monitoring)   Medication management - Magnesium  Hyperlipidemia -check lipids, decrease fatty foods, increase activity.  - Lipid panel  Gastroesophageal reflux disease with esophagitis Continue PPI/H2 blocker, diet discussed  Benign neoplasm of colon Colonoscopy due 09/2017 will call  Absolute anemia Monitor, continue supplements - CBC  Pes Planus Do stretches, get inserts   Future Appointments  Date Time Provider [place]  08/11/2018 10:00 AM Liane Comber, NP [facility] None  11/20/2018  9:30 AM Vicie Mutters, PA-C [facility] None     Subjective:   Robin Zimmerman is a 77 y.o. female who presents for CPE  and 3 month follow up for HTN, chol, glucose management, vitamin D.   BMI is Body mass index is 24.13 kg/m., she has been working on diet and exercise. Wt Readings from Last 3 Encounters:  10/24/17 142 lb 12.8 oz (64.8 kg)  08/04/17 142 lb (64.4 kg)  04/09/17 140 lb 12.8 oz (63.9 kg)   Her blood pressure has been controlled at home, today their BP is BP: 110/70 She does not workout, very sporadic.  She denies chest pain, shortness of breath, dizziness.   She is not on cholesterol medication and denies myalgias. Her cholesterol is not at goal. The cholesterol last visit was:   Lab Results  Component Value Date   CHOL 236 (H) 08/04/2017   HDL 92  08/04/2017   LDLCALC 127 (H) 08/04/2017   TRIG 75 08/04/2017   CHOLHDL 2.6 08/04/2017   She has been working on diet and exercise for glucose management, and denies polydipsia, polyuria and visual disturbances. Last A1C in the office was:  Lab Results  Component Value Date   HGBA1C 5.4 04/09/2017   Patient is on Vitamin D supplement, 10000 IU daily. Lab Results  Component Value Date   VD25OH 48 08/04/2017     Medication Review Current Outpatient Medications on File Prior to Visit  Medication Sig Dispense Refill  . CALCIUM PO Take by mouth daily.    . Cholecalciferol (VITAMIN D PO) Take 5,000 Int'l Units by mouth daily.    . IRON PO Take by mouth daily.    Marland Kitchen latanoprost (XALATAN) 0.005 % ophthalmic solution      No current facility-administered medications on file prior to visit.     Current Problems (verified) Patient Active Problem List   Diagnosis Date Noted  . Pes planus 02/15/2016  . Posterior tibial tendinitis of right lower extremity 01/25/2016  . Encounter for Medicare annual wellness exam 01/18/2016  . Absolute anemia 07/20/2014  . Glaucoma 04/14/2014  . Vitamin D deficiency 04/04/2013  . Medication management 04/04/2013  . Other abnormal glucose 01/19/2013  . GERD (gastroesophageal reflux disease)   . Hyperlipidemia   . Hypertension   . Benign neoplasm of colon 02/16/2007    Screening Tests Immunization History  Administered Date(s) Administered  . Td 06/30/2012    Preventative care: Last colonoscopy: 2014 due 09/2017 will  schedule Last mammogram: 11/2015 - gets q2y will schedule Last pap smear/pelvic exam: 2012   DEXA: 2017  Prior vaccinations: TD or Tdap: 2014 Influenza: declines Prevnar 13 Declines Pneumococcal: declines Shingles/Zostavax: declines  Names of Other Physician/Practitioners you currently use: 1. [facility]- here for primary care 2. Dr. Carollee Sires, [facility], eye doctor, last visit  2019 3. , dental, last visit - remote, encouraged to follow up   Patient Care Team: Unk Pinto, MD as PCP - General (Internal Medicine) Juanita Craver, MD as Consulting Physician (Gastroenterology)   Allergies No Known Allergies  SURGICAL HISTORY She  has a past surgical history that includes Tubal ligation; Cholecystectomy; and Cataract extraction (Right, 06/16/2017). FAMILY HISTORY Her family history includes Alzheimer's disease in her father; Heart disease in her mother. SOCIAL HISTORY She  reports that she quit smoking about 43 years ago. She has never used smokeless tobacco. She reports that she does not drink alcohol or use drugs.    Objective:     Blood pressure 110/70, pulse 68, temperature (!) 97.3 F (36.3 C), resp. rate 12, height 5' 4.5" (1.638 m), weight 142 lb 12.8 oz (64.8 kg), SpO2 97 %. Body mass index is 24.13 kg/m.  General appearance: alert, no distress, WD/WN,  female HEENT: normocephalic, sclerae anicteric, TMs pearly, nares patent, no discharge or erythema, pharynx normal Oral cavity: MMM, no lesions Neck: supple, no lymphadenopathy, no thyromegaly, no masses Heart: RRR, normal S1, S2, no murmurs Lungs: CTA bilaterally, no wheezes, rhonchi, or rales Abdomen: +bs, soft, non tender, non distended, no masses, no hepatomegaly, no splenomegaly Musculoskeletal: nontender, no swelling, no obvious deformity Extremities: no edema, no cyanosis, no clubbing Skin: Seb keratosis over back and AB Pulses: 2+ symmetric, upper and lower extremities, normal cap refill Neurological: alert, oriented x 3, CN2-12 intact, strength normal upper extremities and lower extremities, sensation normal throughout, DTRs 2+ throughout, no cerebellar signs, gait normal Psychiatric: normal affect, behavior normal, pleasant  Breast:  nontender, with several mobile round lumps, no skin changes, no nipple discharge or inversion, no axillary lymphadenopathy Gyn: defer  Rectal:  defer  EKG WNL  Vicie Mutters, PA-C   10/24/2017

## 2017-10-25 LAB — COMPLETE METABOLIC PANEL WITH GFR
AG Ratio: 1.4 (calc) (ref 1.0–2.5)
ALKALINE PHOSPHATASE (APISO): 58 U/L (ref 33–130)
ALT: 9 U/L (ref 6–29)
AST: 15 U/L (ref 10–35)
Albumin: 4.1 g/dL (ref 3.6–5.1)
BILIRUBIN TOTAL: 0.5 mg/dL (ref 0.2–1.2)
BUN: 15 mg/dL (ref 7–25)
CHLORIDE: 101 mmol/L (ref 98–110)
CO2: 34 mmol/L — ABNORMAL HIGH (ref 20–32)
Calcium: 9.6 mg/dL (ref 8.6–10.4)
Creat: 0.91 mg/dL (ref 0.60–0.93)
GFR, EST AFRICAN AMERICAN: 71 mL/min/{1.73_m2} (ref >=60)
GFR, Est Non African American: 61 mL/min/{1.73_m2} (ref >=60)
GLUCOSE: 86 mg/dL (ref 65–99)
Globulin: 2.9 g/dL (calc) (ref 1.9–3.7)
POTASSIUM: 3.9 mmol/L (ref 3.5–5.3)
Sodium: 140 mmol/L (ref 135–146)
TOTAL PROTEIN: 7 g/dL (ref 6.1–8.1)

## 2017-10-25 LAB — LIPID PANEL
CHOLESTEROL: 223 mg/dL — AB (ref <200)
HDL: 91 mg/dL (ref >50)
LDL Cholesterol (Calc): 114 mg/dL (calc) — ABNORMAL HIGH
Non-HDL Cholesterol (Calc): 132 mg/dL (calc) — ABNORMAL HIGH (ref <130)
TRIGLYCERIDES: 79 mg/dL (ref <150)
Total CHOL/HDL Ratio: 2.5 (calc) (ref <5.0)

## 2017-10-25 LAB — URINALYSIS, ROUTINE W REFLEX MICROSCOPIC
BILIRUBIN URINE: NEGATIVE
GLUCOSE, UA: NEGATIVE
Hgb urine dipstick: NEGATIVE
Ketones, ur: NEGATIVE
LEUKOCYTES UA: NEGATIVE
Nitrite: NEGATIVE
PROTEIN: NEGATIVE
SPECIFIC GRAVITY, URINE: 1.017 (ref 1.001–1.03)
pH: >=8.5 — AB (ref 5.0–8.0)

## 2017-10-25 LAB — CBC WITH DIFFERENTIAL/PLATELET
BASOS ABS: 11 {cells}/uL (ref 0–200)
BASOS PCT: 0.3 %
EOS ABS: 61 {cells}/uL (ref 15–500)
Eosinophils Relative: 1.7 %
HCT: 33 % — ABNORMAL LOW (ref 35.0–45.0)
HEMOGLOBIN: 10.8 g/dL — AB (ref 11.7–15.5)
Lymphs Abs: 1379 cells/uL (ref 850–3900)
MCH: 29.2 pg (ref 27.0–33.0)
MCHC: 32.7 g/dL (ref 32.0–36.0)
MCV: 89.2 fL (ref 80.0–100.0)
MONOS PCT: 5.8 %
MPV: 9.9 fL (ref 7.5–12.5)
NEUTROS ABS: 1940 {cells}/uL (ref 1500–7800)
Neutrophils Relative %: 53.9 %
Platelets: 236 10*3/uL (ref 140–400)
RBC: 3.7 10*6/uL — ABNORMAL LOW (ref 3.80–5.10)
RDW: 12 % (ref 11.0–15.0)
Total Lymphocyte: 38.3 %
WBC mixed population: 209 cells/uL (ref 200–950)
WBC: 3.6 10*3/uL — ABNORMAL LOW (ref 3.8–10.8)

## 2017-10-25 LAB — TSH: TSH: 0.39 m[IU]/L — AB (ref 0.40–4.50)

## 2017-10-25 LAB — MICROALBUMIN / CREATININE URINE RATIO
CREATININE, URINE: 93 mg/dL (ref 20–275)
Microalb Creat Ratio: 4 mcg/mg creat (ref <30)
Microalb, Ur: 0.4 mg/dL

## 2017-10-25 LAB — MAGNESIUM: Magnesium: 1.9 mg/dL (ref 1.5–2.5)

## 2017-10-25 LAB — VITAMIN D 25 HYDROXY (VIT D DEFICIENCY, FRACTURES): Vit D, 25-Hydroxy: 58 ng/mL (ref 30–100)

## 2017-10-29 NOTE — Addendum Note (Signed)
Addended by: Vicie Mutters R on: 10/29/2017 02:42 PM   Modules accepted: Orders

## 2017-12-02 ENCOUNTER — Telehealth: Payer: Self-pay | Admitting: Physician Assistant

## 2017-12-02 MED ORDER — PROMETHAZINE-DM 6.25-15 MG/5ML PO SYRP: 5.0000 mL | ORAL_SOLUTION | Freq: Four times a day (QID) | ORAL | 1 refills | Status: DC | PRN

## 2017-12-02 NOTE — Telephone Encounter (Signed)
-----   Message from Elenor Quinones, [place] sent at 12/02/2017  9:32 AM EST ----- Regarding: head cough Contact: [phone] PER OFFICE NOTE        SINCE THIS PAST Thursday......    Head congestion has moved into her chest. Coughing up up clear mucus, no fever or sore throat. She has been gargling salt water, vinegar & honey.   Is there anything else she can try?  [place] office offered appt. But she only wanted a note sent back.  Please advise

## 2017-12-02 NOTE — Telephone Encounter (Signed)
Sent in cough syrup and can do allergy pill, needs OV otherwise if not better

## 2018-02-15 ENCOUNTER — Encounter: Payer: Self-pay | Admitting: Internal Medicine

## 2018-02-15 NOTE — Patient Instructions (Signed)

## 2018-02-15 NOTE — Progress Notes (Signed)
This very nice 78 y.o. DBF  presents for 3 month follow up with Labile HTN, HLD, Pre-Diabetes and Vitamin D Deficiency. Patient has hx/o Thyroiditis (2011) & recovered, but TSH was 0.39 in Oct 2019.     Patient is treated for HTN & BP has been controlled at home. Today's BP is at goal -  120/70. Patient has had no complaints of any cardiac type chest pain, palpitations, dyspnea / orthopnea / PND, dizziness, claudication, or dependent edema.     Hyperlipidemia is not controlled with diet.  Last Lipids were not at goal: Lab Results  Component Value Date   CHOL 223 (H) 10/24/2017   HDL 91 10/24/2017   LDLCALC 114 (H) 10/24/2017   TRIG 79 10/24/2017   CHOLHDL 2.5 10/24/2017      Also, the patient has history of PreDiabetes  (A1c 5.9%  / 2015)  and has had no symptoms of reactive hypoglycemia, diabetic polys, paresthesias or visual blurring.  Last A1c was Normal & at goal: Lab Results  Component Value Date   HGBA1C 5.4 04/09/2017      Further, the patient also has history of Vitamin D Deficiency ("38" / 2015)  and supplements vitamin D without any suspected side-effects. Last vitamin D was at goal: Lab Results  Component Value Date   VD25OH 58 10/24/2017   Current Outpatient Medications on File Prior to Visit  Medication Sig  . CALCIUM PO Take by mouth daily.  . Cholecalciferol (VITAMIN D PO) Take 5,000 Int'l Units by mouth daily.  . IRON PO Take by mouth daily.  Marland Kitchen latanoprost (XALATAN) 0.005 % ophthalmic solution    No current facility-administered medications on file prior to visit.    Allergies  Allergen Reactions  . Prednisone Other (See Comments)    Hallucinations   PMHx:   Past Medical History:  Diagnosis Date  . Anemia   . GERD (gastroesophageal reflux disease)   . Glaucoma   . Hyperlipidemia   . Hypertension   . Thyroid disease    Immunization History  Administered Date(s) Administered  . Td 06/30/2012   Past Surgical History:  Procedure Laterality Date    . CATARACT EXTRACTION Right 06/16/2017   Dr. Cher Nakai  . CHOLECYSTECTOMY    . TUBAL LIGATION     FHx:    Reviewed / unchanged  SHx:    Reviewed / unchanged   Systems Review:  Constitutional: Denies fever, chills, wt changes, headaches, insomnia, fatigue, night sweats, change in appetite. Eyes: Denies redness, blurred vision, diplopia, discharge, itchy, watery eyes.  ENT: Denies discharge, congestion, post nasal drip, epistaxis, sore throat, earache, hearing loss, dental pain, tinnitus, vertigo, sinus pain, snoring.  CV: Denies chest pain, palpitations, irregular heartbeat, syncope, dyspnea, diaphoresis, orthopnea, PND, claudication or edema. Respiratory: denies cough, dyspnea, DOE, pleurisy, hoarseness, laryngitis, wheezing.  Gastrointestinal: Denies dysphagia, odynophagia, heartburn, reflux, water brash, abdominal pain or cramps, nausea, vomiting, bloating, diarrhea, constipation, hematemesis, melena, hematochezia  or hemorrhoids. Genitourinary: Denies dysuria, frequency, urgency, nocturia, hesitancy, discharge, hematuria or flank pain. Musculoskeletal: Denies arthralgias, myalgias, stiffness, jt. swelling, pain, limping or strain/sprain.  Skin: Denies pruritus, rash, hives, warts, acne, eczema or change in skin lesion(s). Neuro: No weakness, tremor, incoordination, spasms, paresthesia or pain. Psychiatric: Denies confusion, memory loss or sensory loss. Endo: Denies change in weight, skin or hair change.  Heme/Lymph: No excessive bleeding, bruising or enlarged lymph nodes.  Physical Exam  BP 120/70   Pulse 89   Temp (!) 97.3 F (36.3  C)   Ht 5' 4.5" (1.638 m)   Wt 138 lb 3.2 oz (62.7 kg)   SpO2 97%   BMI 23.36 kg/m   Appears  well nourished, well groomed  and in no distress.  Eyes: PERRLA, EOMs, conjunctiva no swelling or erythema. Sinuses: No frontal/maxillary tenderness ENT/Mouth: EAC's clear, TM's nl w/o erythema, bulging. Nares clear w/o erythema, swelling, exudates.  Oropharynx clear without erythema or exudates. Oral hygiene is good. Tongue normal, non obstructing. Hearing intact.  Neck: Supple. Thyroid not palpable. Car 2+/2+ without bruits, nodes or JVD. Chest: Respirations nl with BS clear & equal w/o rales, rhonchi, wheezing or stridor.  Cor: Heart sounds normal w/ regular rate and rhythm without sig. murmurs, gallops, clicks or rubs. Peripheral pulses normal and equal  without edema.  Abdomen: Soft & bowel sounds normal. Non-tender w/o guarding, rebound, hernias, masses or organomegaly.  Lymphatics: Unremarkable.  Musculoskeletal: Full ROM all peripheral extremities, joint stability, 5/5 strength and normal gait.  Skin: Warm, dry without exposed rashes, lesions or ecchymosis apparent.  Neuro: Cranial nerves intact, reflexes equal bilaterally. Sensory-motor testing grossly intact. Tendon reflexes grossly intact.  Pysch: Alert & oriented x 3.  Insight and judgement nl & appropriate. No ideations.  Assessment and Plan:  1. Labile hypertension  - Continue medication, monitor blood pressure at home.  - Continue DASH diet.  Reminder to go to the ER if any CP,  SOB, nausea, dizziness, severe HA, changes vision/speech.  - CBC with Differential/Platelet - COMPLETE METABOLIC PANEL WITH GFR - Magnesium - TSH  2. Hyperlipidemia, mixed  - Continue diet/meds, exercise,& lifestyle modifications.  - Continue monitor periodic cholesterol/liver & renal functions   - Lipid panel - TSH  3. Abnormal glucose  - Continue diet, exercise  - Lifestyle modifications.  - Monitor appropriate labs.  - Hemoglobin A1c - Insulin, random  4. Vitamin D deficiency  - Continue supplementation.   - VITAMIN D 25 Hydroxyl  5. Prediabetes  - Hemoglobin A1c - Insulin, random  6. History of Thyroiditis  - TSH  7. Medication management  - CBC with Differential/Platelet - COMPLETE METABOLIC PANEL WITH GFR - Magnesium - Lipid panel - TSH - Hemoglobin  A1c - Insulin, random - VITAMIN D 25 Hydroxyl        Discussed  regular exercise, BP monitoring, weight control to achieve/maintain BMI less than 25 and discussed med and SE's. Recommended labs to assess and monitor clinical status with further disposition pending results of labs. Over 30 minutes of exam, counseling, chart review was performed.

## 2018-02-16 ENCOUNTER — Encounter: Payer: Self-pay | Admitting: Internal Medicine

## 2018-02-16 ENCOUNTER — Ambulatory Visit (INDEPENDENT_AMBULATORY_CARE_PROVIDER_SITE_OTHER): Payer: PPO | Admitting: Internal Medicine

## 2018-02-16 VITALS — BP 120/70 | HR 89 | Temp 97.3°F | Ht 64.5 in | Wt 138.2 lb

## 2018-02-16 DIAGNOSIS — R0989 Other specified symptoms and signs involving the circulatory and respiratory systems: Secondary | ICD-10-CM | POA: Diagnosis not present

## 2018-02-16 DIAGNOSIS — R7303 Prediabetes: Secondary | ICD-10-CM | POA: Diagnosis not present

## 2018-02-16 DIAGNOSIS — Z79899 Other long term (current) drug therapy: Secondary | ICD-10-CM | POA: Diagnosis not present

## 2018-02-16 DIAGNOSIS — E782 Mixed hyperlipidemia: Secondary | ICD-10-CM

## 2018-02-16 DIAGNOSIS — Z8639 Personal history of other endocrine, nutritional and metabolic disease: Secondary | ICD-10-CM | POA: Diagnosis not present

## 2018-02-16 DIAGNOSIS — E559 Vitamin D deficiency, unspecified: Secondary | ICD-10-CM

## 2018-02-16 DIAGNOSIS — R7309 Other abnormal glucose: Secondary | ICD-10-CM | POA: Diagnosis not present

## 2018-02-17 ENCOUNTER — Other Ambulatory Visit: Payer: Self-pay | Admitting: Internal Medicine

## 2018-02-17 DIAGNOSIS — E782 Mixed hyperlipidemia: Secondary | ICD-10-CM

## 2018-02-17 LAB — TSH: TSH: 0.7 mIU/L (ref 0.40–4.50)

## 2018-02-17 LAB — COMPLETE METABOLIC PANEL WITH GFR
AG Ratio: 1.5 (calc) (ref 1.0–2.5)
ALBUMIN MSPROF: 4.4 g/dL (ref 3.6–5.1)
ALT: 9 U/L (ref 6–29)
AST: 15 U/L (ref 10–35)
Alkaline phosphatase (APISO): 58 U/L (ref 37–153)
BUN / CREAT RATIO: 13 (calc) (ref 6–22)
BUN: 13 mg/dL (ref 7–25)
CALCIUM: 9.7 mg/dL (ref 8.6–10.4)
CHLORIDE: 104 mmol/L (ref 98–110)
CO2: 33 mmol/L — AB (ref 20–32)
CREATININE: 0.97 mg/dL — AB (ref 0.60–0.93)
GFR, EST AFRICAN AMERICAN: 65 mL/min/{1.73_m2} (ref >=60)
GFR, Est Non African American: 56 mL/min/{1.73_m2} — ABNORMAL LOW (ref >=60)
GLUCOSE: 74 mg/dL (ref 65–99)
Globulin: 3 g/dL (calc) (ref 1.9–3.7)
Potassium: 4.4 mmol/L (ref 3.5–5.3)
Sodium: 142 mmol/L (ref 135–146)
TOTAL PROTEIN: 7.4 g/dL (ref 6.1–8.1)
Total Bilirubin: 0.7 mg/dL (ref 0.2–1.2)

## 2018-02-17 LAB — HEMOGLOBIN A1C
HEMOGLOBIN A1C: 5.6 %{Hb} (ref <5.7)
Mean Plasma Glucose: 114 (calc)
eAG (mmol/L): 6.3 (calc)

## 2018-02-17 LAB — CBC WITH DIFFERENTIAL/PLATELET
Absolute Monocytes: 198 cells/uL — ABNORMAL LOW (ref 200–950)
BASOS ABS: 21 {cells}/uL (ref 0–200)
Basophils Relative: 0.7 %
EOS PCT: 1.7 %
Eosinophils Absolute: 51 cells/uL (ref 15–500)
HEMATOCRIT: 36.2 % (ref 35.0–45.0)
HEMOGLOBIN: 12 g/dL (ref 11.7–15.5)
LYMPHS ABS: 1191 {cells}/uL (ref 850–3900)
MCH: 29.7 pg (ref 27.0–33.0)
MCHC: 33.1 g/dL (ref 32.0–36.0)
MCV: 89.6 fL (ref 80.0–100.0)
MPV: 10 fL (ref 7.5–12.5)
Monocytes Relative: 6.6 %
NEUTROS ABS: 1539 {cells}/uL (ref 1500–7800)
Neutrophils Relative %: 51.3 %
Platelets: 270 10*3/uL (ref 140–400)
RBC: 4.04 10*6/uL (ref 3.80–5.10)
RDW: 12.4 % (ref 11.0–15.0)
Total Lymphocyte: 39.7 %
WBC: 3 10*3/uL — ABNORMAL LOW (ref 3.8–10.8)

## 2018-02-17 LAB — LIPID PANEL
Cholesterol: 246 mg/dL — ABNORMAL HIGH (ref <200)
HDL: 98 mg/dL (ref >=50)
LDL CHOLESTEROL (CALC): 132 mg/dL — AB
NON-HDL CHOLESTEROL (CALC): 148 mg/dL — AB (ref <130)
TRIGLYCERIDES: 65 mg/dL (ref <150)
Total CHOL/HDL Ratio: 2.5 (calc) (ref <5.0)

## 2018-02-17 LAB — VITAMIN D 25 HYDROXY (VIT D DEFICIENCY, FRACTURES): VIT D 25 HYDROXY: 57 ng/mL (ref 30–100)

## 2018-02-17 LAB — MAGNESIUM: MAGNESIUM: 2.1 mg/dL (ref 1.5–2.5)

## 2018-02-17 LAB — INSULIN, RANDOM: INSULIN: 13.7 u[IU]/mL (ref 2.0–19.6)

## 2018-02-17 MED ORDER — PITAVASTATIN CALCIUM 4 MG PO TABS: ORAL_TABLET | ORAL | 1 refills | Status: DC

## 2018-06-01 ENCOUNTER — Ambulatory Visit: Payer: Self-pay | Admitting: Physician Assistant

## 2018-06-09 NOTE — Progress Notes (Signed)
MEDICARE ANNUAL WELLNESS VISIT AND 3 month OV  Assessment:    Encounter for Medicare annual wellness exam 1 year  Essential hypertension - continue medications, DASH diet, exercise and monitor at home. Call if greater than 130/80.  - CBC with Differential/Platelet - CMP/GFR  Other abnormal glucose Recent A1Cs at goal Discussed diet/exercise, weight management  Defer A1C; check CMP   Hypothyroidism, unspecified hypothyroidism type - TSH Hypothyroidism-check TSH level, continue medications the same, reminded to take on an empty stomach 30-73mins before food.    Glaucoma Continue to follow up with eye doctor  Vitamin D deficiency - Vit D  25 hydroxy (rtn osteoporosis monitoring)   Medication management - Magnesium  Hyperlipidemia -check lipids, decrease fatty foods, increase activity.  - Lipid panel  Gastroesophageal reflux disease with esophagitis Continue PPI/H2 blocker, diet discussed  Benign neoplasm of colon Colonoscopy due 09/2017  Absolute anemia Monitor, continue supplements - CBC    Future Appointments  Date Time Provider [place]  09/02/2018 10:00 AM Vicie Mutters, PA-C [facility] None  12/15/2018 11:00 AM Vicie Mutters, PA-C [facility] None     Plan:   During the course of the visit the patient was educated and counseled about appropriate screening and preventive services including:    Pneumococcal vaccine   Influenza vaccine  Td vaccine  Screening electrocardiogram  Screening mammography  Bone densitometry screening  Colorectal cancer screening  Diabetes screening  Glaucoma screening  Nutrition counseling   Advanced directives: given information/requested   Subjective:   Robin Zimmerman is a 78 y.o. female who presents for Medicare Annual Wellness Visit and 3 month follow up for HTN, chol, glucose management, vitamin D.   BMI is Body mass index is 24.34 kg/m., she has been working on diet and exercise. Wt  Readings from Last 3 Encounters:  06/10/18 144 lb (65.3 kg)  02/16/18 138 lb 3.2 oz (62.7 kg)  10/24/17 142 lb 12.8 oz (64.8 kg)   Her blood pressure has been controlled at home, today their BP is BP: 130/80 She does not workout, very sporadic.  She denies chest pain, shortness of breath, dizziness.   She is not on cholesterol medication and denies myalgias. Her cholesterol is not at goal. The cholesterol last visit was:   Lab Results  Component Value Date   CHOL 246 (H) 02/16/2018   HDL 98 02/16/2018   LDLCALC 132 (H) 02/16/2018   TRIG 65 02/16/2018   CHOLHDL 2.5 02/16/2018   She has been working on diet and exercise for glucose management, and denies polydipsia, polyuria and visual disturbances. Last A1C in the office was:  Lab Results  Component Value Date   HGBA1C 5.6 02/16/2018   Patient is on Vitamin D supplement, 10000 IU daily. Lab Results  Component Value Date   VD25OH 57 02/16/2018     Medication Review Current Outpatient Medications on File Prior to Visit  Medication Sig Dispense Refill  . CALCIUM PO Take by mouth daily.    . Cholecalciferol (VITAMIN D PO) Take 5,000 Int'l Units by mouth daily.    . IRON PO Take by mouth daily.    Marland Kitchen latanoprost (XALATAN) 0.005 % ophthalmic solution      No current facility-administered medications on file prior to visit.     Current Problems (verified) Patient Active Problem List   Diagnosis Date Noted  . Pes planus 02/15/2016  . Posterior tibial tendinitis of right lower extremity 01/25/2016  . Encounter for Medicare annual wellness exam 01/18/2016  . Absolute  anemia 07/20/2014  . Glaucoma 04/14/2014  . Vitamin D deficiency 04/04/2013  . Medication management 04/04/2013  . Other abnormal glucose 01/19/2013  . GERD (gastroesophageal reflux disease)   . Hyperlipidemia   . Hypertension   . Benign neoplasm of colon 02/16/2007    Screening Tests Immunization History  Administered Date(s) Administered  . Td 06/30/2012     Preventative care: Last colonoscopy: 2014 due 09/2017 Last mammogram: 11/2015 - needs to [place] Last pap smear/pelvic exam: 2012  remote DEXA: 2017  Prior vaccinations: TD or Tdap: 2014 Influenza: declines Prevnar 13 Declines Pneumococcal: declines Shingles/Zostavax: declines  Names of Other Physician/Practitioners you currently use: 1. [facility]- here for primary care 2. Dr. Carollee Sires, [facility], eye doctor, last visit 2019 3. , dental, last visit - remote, encouraged to follow up   Patient Care Team: Unk Pinto, MD as PCP - General (Internal Medicine) Juanita Craver, MD as Consulting Physician (Gastroenterology)   Allergies Allergies  Allergen Reactions  . Prednisone Other (See Comments)    Hallucinations    SURGICAL HISTORY She  has a past surgical history that includes Tubal ligation; Cholecystectomy; and Cataract extraction (Right, 06/16/2017). FAMILY HISTORY Her family history includes Alzheimer's disease in her father; Heart disease in her mother. SOCIAL HISTORY She  reports that she quit smoking about 43 years ago. She has never used smokeless tobacco. She reports that she does not drink alcohol or use drugs.  MEDICARE WELLNESS OBJECTIVES: Physical activity: no regular exercise Depression/mood screen:   Depression screen [facility] 2/9 06/10/2018  Decreased Interest 1  Down, Depressed, Hopeless 0  PHQ - 2 Score 1   ADLs:  In your present state of health, do you have any difficulty performing the following activities: 06/10/2018 02/15/2018  Hearing? N N  Vision? N N  Comment - -  Difficulty concentrating or making decisions? Y N  Comment occasionally trouble finding words -  Walking or climbing stairs? N N  Dressing or bathing? N N  Doing errands, shopping? N N  Some recent data might be hidden    Cognitive Testing  Alert? Yes  Normal Appearance?Yes  Oriented to person? Yes  Place? Yes   Time? Yes  Recall  of three objects?  Yes  Can perform simple calculations? Yes  Displays appropriate judgment?Yes  Can read the correct time from a watch face?Yes  EOL planning: Does Patient Have a Medical Advance Directive?: No Would patient like information on creating a medical advance directive?: Yes (MAU/Ambulatory/Procedural Areas - Information given)    Objective:     Blood pressure 130/80, pulse 78, temperature 97.7 F (36.5 C), height 5' 4.5" (1.638 m), weight 144 lb (65.3 kg), SpO2 99 %. Body mass index is 24.34 kg/m.  General appearance: alert, no distress, WD/WN,  female HEENT: normocephalic, sclerae anicteric, TMs pearly, nares patent, no discharge or erythema, pharynx normal Oral cavity: MMM, no lesions Neck: supple, no lymphadenopathy, no thyromegaly, no masses Heart: RRR, normal S1, S2, no murmurs Lungs: CTA bilaterally, no wheezes, rhonchi, or rales Abdomen: +bs, soft, non tender, non distended, no masses, no hepatomegaly, no splenomegaly Musculoskeletal: nontender, no swelling, no obvious deformity  Extremities: no edema, no cyanosis, no clubbing Skin: Seb keratosis over back and AB Pulses: 2+ symmetric, upper and lower extremities, normal cap refill Neurological: alert, oriented x 3, CN2-12 intact, strength normal upper extremities and lower extremities, sensation normal throughout, DTRs 2+ throughout, no cerebellar signs, gait normal Psychiatric: normal affect, behavior normal, pleasant  Breast:  nontender, with several mobile round lumps, no skin changes, no nipple discharge or inversion, no axillary lymphadenopathy Gyn: defer  Rectal: defer  Medicare Attestation I have personally reviewed: The patient's medical and social history Their use of alcohol, tobacco or illicit drugs Their current medications and supplements The patient's functional ability including ADLs,fall risks, home safety risks, cognitive, and hearing and visual impairment Diet and physical  activities Evidence for depression or mood disorders  The patient's weight, height, BMI, and visual acuity have been recorded in the chart.  I have made referrals, counseling, and provided education to the patient based on review of the above and I have provided the patient with a written personalized care plan for preventive services.     Vicie Mutters, PA-C   06/10/2018

## 2018-06-10 ENCOUNTER — Ambulatory Visit (INDEPENDENT_AMBULATORY_CARE_PROVIDER_SITE_OTHER): Payer: PPO | Admitting: Physician Assistant

## 2018-06-10 ENCOUNTER — Encounter: Payer: Self-pay | Admitting: Physician Assistant

## 2018-06-10 ENCOUNTER — Other Ambulatory Visit: Payer: Self-pay

## 2018-06-10 VITALS — BP 130/80 | HR 78 | Temp 97.7°F | Ht 64.5 in | Wt 144.0 lb

## 2018-06-10 DIAGNOSIS — D649 Anemia, unspecified: Secondary | ICD-10-CM | POA: Diagnosis not present

## 2018-06-10 DIAGNOSIS — Z0001 Encounter for general adult medical examination with abnormal findings: Secondary | ICD-10-CM

## 2018-06-10 DIAGNOSIS — E785 Hyperlipidemia, unspecified: Secondary | ICD-10-CM | POA: Diagnosis not present

## 2018-06-10 DIAGNOSIS — R7309 Other abnormal glucose: Secondary | ICD-10-CM | POA: Diagnosis not present

## 2018-06-10 DIAGNOSIS — H409 Unspecified glaucoma: Secondary | ICD-10-CM | POA: Diagnosis not present

## 2018-06-10 DIAGNOSIS — E559 Vitamin D deficiency, unspecified: Secondary | ICD-10-CM

## 2018-06-10 DIAGNOSIS — Z79899 Other long term (current) drug therapy: Secondary | ICD-10-CM | POA: Diagnosis not present

## 2018-06-10 DIAGNOSIS — K219 Gastro-esophageal reflux disease without esophagitis: Secondary | ICD-10-CM | POA: Diagnosis not present

## 2018-06-10 DIAGNOSIS — R6889 Other general symptoms and signs: Secondary | ICD-10-CM

## 2018-06-10 DIAGNOSIS — Z0000 Encounter for general adult medical examination without abnormal findings: Secondary | ICD-10-CM

## 2018-06-10 DIAGNOSIS — I10 Essential (primary) hypertension: Secondary | ICD-10-CM | POA: Diagnosis not present

## 2018-06-10 DIAGNOSIS — D126 Benign neoplasm of colon, unspecified: Secondary | ICD-10-CM | POA: Diagnosis not present

## 2018-06-10 NOTE — Patient Instructions (Addendum)
HOW TO SCHEDULE A MAMMOGRAM  [facility]  7 a.m.-6:30 p.m., Monday 7 a.m.-5 p.m., Tuesday-Friday Schedule an appointment by calling [phone]. Can also schedule online  Call about your colonoscopy will likely be the last one.    Ask insurance and pharmacy about shingrix - new vaccine   Can go to [email or web address] for more information  Shingrix Vaccination  Two vaccines are licensed and recommended to prevent shingles in the [place] Zoster vaccine live (ZVL, Zostavax) has been in use since 2006. Recombinant zoster vaccine (RZV, Shingrix), has been in use since 2017 and is recommended by ACIP as the preferred shingles vaccine.  What Everyone Should Know about Shingles Vaccine (Shingrix) One of the Recommended Vaccines by Disease Shingles vaccination is the only way to protect against shingles and postherpetic neuralgia (PHN), the most common complication from shingles. CDC recommends that healthy adults 50 years and older get two doses of the shingles vaccine called Shingrix (recombinant zoster vaccine), separated by 2 to 6 months, to prevent shingles and the complications from the disease. Your doctor or pharmacist can give you Shingrix as a shot in your upper arm. Shingrix provides strong protection against shingles and PHN. Two doses of Shingrix is more than 90% effective at preventing shingles and PHN. Protection stays above 85% for at least the first four years after you get vaccinated. Shingrix is the preferred vaccine, over Zostavax (zoster vaccine live), a shingles vaccine in use since 2006. Zostavax may still be used to prevent shingles in healthy adults 60 years and older. For example, you could use Zostavax if a person is allergic to Shingrix, prefers Zostavax, or requests immediate vaccination and Shingrix is unavailable. Who Should Get Shingrix? Healthy adults 50 years and older should get two doses  of Shingrix, separated by 2 to 6 months. You should get Shingrix even if in the past you . had shingles  . received Zostavax  . are not sure if you had chickenpox There is no maximum age for getting Shingrix. If you had shingles in the past, you can get Shingrix to help prevent future occurrences of the disease. There is no specific length of time that you need to wait after having shingles before you can receive Shingrix, but generally you should make sure the shingles rash has gone away before getting vaccinated. You can get Shingrix whether or not you remember having had chickenpox in the past. Studies show that more than 99% of Americans 40 years and older have had chickenpox, even if they don't remember having the disease. Chickenpox and shingles are related because they are caused by the same virus (varicella zoster virus). After a person recovers from chickenpox, the virus stays dormant (inactive) in the body. It can reactivate years later and cause shingles. If you had Zostavax in the recent past, you should wait at least eight weeks before getting Shingrix. Talk to your healthcare provider to determine the best time to get Shingrix. Shingrix is available in [facility] and pharmacies. To find doctor's offices or pharmacies near you that offer the vaccine, visit HealthMap Vaccine FinderExternal. If you have questions about Shingrix, talk with your healthcare provider. Vaccine for Those 21 Years and Older  Shingrix reduces the risk of shingles and PHN by more than 90% in people 64 and older. CDC recommends the vaccine for healthy adults 72 and older.  Who Should Not Get Shingrix? You should not get Shingrix if you: . have ever had a severe allergic reaction  to any component of the vaccine or after a dose of Shingrix  . tested negative for immunity to varicella zoster virus. If you test negative, you should get chickenpox vaccine.  . currently have shingles  . currently are pregnant or  breastfeeding. Women who are pregnant or breastfeeding should wait to get Shingrix.  Marland Kitchen receive specific antiviral drugs (acyclovir, famciclovir, or valacyclovir) 24 hours before vaccination (avoid use of these antiviral drugs for 14 days after vaccination)- zoster vaccine live only If you have a minor acute (starts suddenly) illness, such as a cold, you may get Shingrix. But if you have a moderate or severe acute illness, you should usually wait until you recover before getting the vaccine. This includes anyone with a temperature of 101.52F or higher. The side effects of the Shingrix are temporary, and usually last 2 to 3 days. While you may experience pain for a few days after getting Shingrix, the pain will be less severe than having shingles and the complications from the disease. How Well Does Shingrix Work? Two doses of Shingrix provides strong protection against shingles and postherpetic neuralgia (PHN), the most common complication of shingles. . In adults 74 to 78 years old who got two doses, Shingrix was 97% effective in preventing shingles; among adults 70 years and older, Shingrix was 91% effective.  . In adults 59 to 78 years old who got two doses, Shingrix was 91% effective in preventing PHN; among adults 70 years and older, Shingrix was 89% effective. Shingrix protection remained high (more than 85%) in people 70 years and older throughout the four years following vaccination. Since your risk of shingles and PHN increases as you get older, it is important to have strong protection against shingles in your older years. Top of Page  What Are the Possible Side Effects of Shingrix? Studies show that Shingrix is safe. The vaccine helps your body create a strong defense against shingles. As a result, you are likely to have temporary side effects from getting the shots. The side effects may affect your ability to do normal daily activities for 2 to 3 days. Most people got a sore arm with mild or  moderate pain after getting Shingrix, and some also had redness and swelling where they got the shot. Some people felt tired, had muscle pain, a headache, shivering, fever, stomach pain, or nausea. About 1 out of 6 people who got Shingrix experienced side effects that prevented them from doing regular activities. Symptoms went away on their own in about 2 to 3 days. Side effects were more common in younger people. You might have a reaction to the first or second dose of Shingrix, or both doses. If you experience side effects, you may choose to take over-the-counter pain medicine such as ibuprofen or acetaminophen. If you experience side effects from Shingrix, you should report them to the Vaccine Adverse Event Reporting System (VAERS). Your doctor might file this report, or you can do it yourself through the VAERS websiteExternal, or by calling [phone]. If you have any questions about side effects from Shingrix, talk with your doctor. The shingles vaccine does not contain thimerosal (a preservative containing mercury). Top of Page  When Should I See a Doctor Because of the Side Effects I Experience From Shingrix? In clinical trials, Shingrix was not associated with serious adverse events. In fact, serious side effects from vaccines are extremely rare. For example, for every 1 million doses of a vaccine given, only one or two people may have a  severe allergic reaction. Signs of an allergic reaction happen within minutes or hours after vaccination and include hives, swelling of the face and throat, difficulty breathing, a fast heartbeat, dizziness, or weakness. If you experience these or any other life-threatening symptoms, see a doctor right away. Shingrix causes a strong response in your immune system, so it may produce short-term side effects more intense than you are used to from other vaccines. These side effects can be uncomfortable, but they are expected and usually go away on their own in 2 or 3  days. Top of Page  How Can I Pay For Shingrix? There are several ways shingles vaccine may be paid for: Medicare . Medicare Part D plans cover the shingles vaccine, but there may be a cost to you depending on your plan. There may be a copay for the vaccine, or you may need to pay in full then get reimbursed for a certain amount.  . Medicare Part B does not cover the shingles vaccine. Medicaid . Medicaid may or may not cover the vaccine. Contact your insurer to find out. Private health insurance . Many private health insurance plans will cover the vaccine, but there may be a cost to you depending on your plan. Contact your insurer to find out. Vaccine assistance programs . Some pharmaceutical companies provide vaccines to eligible adults who cannot afford them. You may want to check with the vaccine manufacturer, [employer], about Shingrix. If you do not currently have health insurance, learn more about affordable health coverage optionsExternal. To find doctor's offices or pharmacies near you that offer the vaccine, visit HealthMap Vaccine FinderExternal.  Pneumococcal Conjugate Vaccine suspension for injection What is this medicine? PNEUMOCOCCAL VACCINE (NEU mo KOK al vak SEEN) is a vaccine used to prevent pneumococcus bacterial infections. These bacteria can cause serious infections like pneumonia, meningitis, and blood infections. This vaccine will lower your chance of getting pneumonia. If you do get pneumonia, it can make your symptoms milder and your illness shorter. This vaccine will not treat an infection and will not cause infection. This vaccine is recommended for infants and young children, adults with certain medical conditions, and adults 43 years or older. This medicine may be used for other purposes; ask your health care provider or pharmacist if you have questions. COMMON BRAND NAME(S): Prevnar, Prevnar 13 What should I tell my health care provider before I take this  medicine? They need to know if you have any of these conditions: -bleeding problems -fever -immune system problems -an unusual or allergic reaction to pneumococcal vaccine, diphtheria toxoid, other vaccines, latex, other medicines, foods, dyes, or preservatives -pregnant or trying to get pregnant -breast-feeding How should I use this medicine? This vaccine is for injection into a muscle. It is given by a health care professional. A copy of Vaccine Information Statements will be given before each vaccination. Read this sheet carefully each time. The sheet may change frequently. Talk to your pediatrician regarding the use of this medicine in children. While this drug may be prescribed for children as young as 21 weeks old for selected conditions, precautions do apply. Overdosage: If you think you have taken too much of this medicine contact a poison control center or emergency room at once. NOTE: This medicine is only for you. Do not share this medicine with others. What if I miss a dose? It is important not to miss your dose. Call your doctor or health care professional if you are unable to keep an appointment. What may interact  with this medicine? -medicines for cancer chemotherapy -medicines that suppress your immune function -steroid medicines like prednisone or cortisone This list may not describe all possible interactions. Give your health care provider a list of all the medicines, herbs, non-prescription drugs, or dietary supplements you use. Also tell them if you smoke, drink alcohol, or use illegal drugs. Some items may interact with your medicine. What should I watch for while using this medicine? Mild fever and pain should go away in 3 days or less. Report any unusual symptoms to your doctor or health care professional. What side effects may I notice from receiving this medicine? Side effects that you should report to your doctor or health care professional as soon as  possible: -allergic reactions like skin rash, itching or hives, swelling of the face, lips, or tongue -breathing problems -confused -fast or irregular heartbeat -fever over 102 degrees F -seizures -unusual bleeding or bruising -unusual muscle weakness Side effects that usually do not require medical attention (report to your doctor or health care professional if they continue or are bothersome): -aches and pains -diarrhea -fever of 102 degrees F or less -headache -irritable -loss of appetite -pain, tender at site where injected -trouble sleeping This list may not describe all possible side effects. Call your doctor for medical advice about side effects. You may report side effects to FDA at 1-800-FDA-1088. Where should I keep my medicine? This does not apply. This vaccine is given in a clinic, pharmacy, doctor's office, or other health care setting and will not be stored at home. NOTE: This sheet is a summary. It may not cover all possible information. If you have questions about this medicine, talk to your doctor, pharmacist, or health care provider.  2019 Elsevier/Gold Standard (2013-10-07 10:27:27)   10 Tips on Belching, Bloating, and Flatulence 1. Belching is caused by swallowed air from:  Eating or drinking too fast  Poorly fitting dentures; not chewing food completely  Carbonated beverages  Chewing gum or sucking on hard candies  Excessive swallowing due to nervous tension or postnasal drip  Forced belching to relieve abdominal discomfort 2. To prevent excessive belching, avoid:  Carbonated beverages  Chewing gum  Hard candies  Simethicone/GasX may be helpful  3. Abdominal bloating and discomfort may be due to intestinal sensitivity or symptoms of irritable bowel syndrome. To relieve symptoms, avoid:  Broccoli  Baked beans  Cabbage  Carbonated drinks  Cauliflower  Chewing gum  Hard candy 4. Abdominal distention resulting from weak abdominal muscles:  Is better in  the morning  Gets worse as the day progresses  Is relieved by lying down 5. To prevent Abdominal distention:  Tighten abdominal muscles by pulling in your stomach several times during the day  Do sit-up exercises if possible  Wear an abdominal support garment if exercise is too difficult 6. Flatulence is gas created through bacterial action in the bowel and passed rectally. Keep in mind that:  10-18 passages per day are normal  Primary gases are harmless and odorless  Noticeable smells are trace gases related to food intake 7. Foods that are likely to form gas include:  Milk, dairy products, and medications that contain lactose--If your body doesn't produce the enzyme (lactase) to break it down.  Certain vegetables--baked beans, cauliflower, broccoli, cabbage  Certain starches--wheat, oats, corn, potatoes. Rice is a good substitute. 8. Identify offending foods. Reduce or eliminate these gas-forming foods from your diet.  Can refer to pelvic floor Pelvic Floor Exercises for Bowel Control  Exercises  using both the external anal sphincter and the deep pelvic floor muscles can help you to improve your bowel control. When done correctly, these exercises can tone and strengthen the muscles to help you hold back gas and prevent fecal incontinence (leakage of stool). Exercise programs take time; you may not see any noticeable change in your bowel control immediately.  In some cases it may take several months to regain control.  Bowel Control Muscles The anus and the anal canal, has rings of muscle around it. The outer ring of muscle is called the external anal sphincter; it is a voluntary muscle which you can learn to tighten and close more efficiently. When you contract it you will feel the skin around your anus tighten and pull in as if the anus is winking. Try to keep the buttocks muscles relaxed. The inner ring around the anus is the internal anal sphincter. It is an involuntary and automatic  muscle; you don't have to think to keep it closed or open.  This muscle should be closed at all times, except when you are actually trying to have a bowel movement.  In addition to the sphincter muscles, there are deeper muscles called the levator ani that form a sling from your tailbone to your pubic bone. The levator ani muscle has a specific part called the puborectalis that holds stool in until you give the signal to relax and empty.  When you contract these muscles it creates a feeling of lifting the anus inward.  External Anal Sphincter        Levator ani deep layer   Effective Exercises for Control of Gas and Bowels . Identify the specific areas of the pelvic floor muscles you need to use.  This can be done using a mirror to see if you are contracting the correct muscles or by placing the pad of your finger at or just inside the anal opening. . Develop an exercise plan for strength, endurance and quick response of the muscles and stick with it.  You must make the muscles do more than they are used to doing. . Incorporate the exercises into your daily activities.   2007, [employer] Doc.36

## 2018-06-11 LAB — COMPLETE METABOLIC PANEL WITH GFR
AG Ratio: 1.4 (calc) (ref 1.0–2.5)
ALT: 10 U/L (ref 6–29)
AST: 16 U/L (ref 10–35)
Albumin: 4.2 g/dL (ref 3.6–5.1)
Alkaline phosphatase (APISO): 58 U/L (ref 37–153)
BUN: 13 mg/dL (ref 7–25)
CO2: 29 mmol/L (ref 20–32)
Calcium: 9.9 mg/dL (ref 8.6–10.4)
Chloride: 103 mmol/L (ref 98–110)
Creat: 0.89 mg/dL (ref 0.60–0.93)
GFR, Est African American: 72 mL/min/{1.73_m2} (ref >=60)
GFR, Est Non African American: 63 mL/min/{1.73_m2} (ref >=60)
Globulin: 2.9 g/dL (calc) (ref 1.9–3.7)
Glucose, Bld: 92 mg/dL (ref 65–99)
Potassium: 4.2 mmol/L (ref 3.5–5.3)
Sodium: 139 mmol/L (ref 135–146)
Total Bilirubin: 0.6 mg/dL (ref 0.2–1.2)
Total Protein: 7.1 g/dL (ref 6.1–8.1)

## 2018-06-11 LAB — CBC WITH DIFFERENTIAL/PLATELET
Absolute Monocytes: 197 cells/uL — ABNORMAL LOW (ref 200–950)
Basophils Absolute: 31 cells/uL (ref 0–200)
Basophils Relative: 0.9 %
Eosinophils Absolute: 51 cells/uL (ref 15–500)
Eosinophils Relative: 1.5 %
HCT: 34 % — ABNORMAL LOW (ref 35.0–45.0)
Hemoglobin: 11.3 g/dL — ABNORMAL LOW (ref 11.7–15.5)
Lymphs Abs: 1238 cells/uL (ref 850–3900)
MCH: 30.1 pg (ref 27.0–33.0)
MCHC: 33.2 g/dL (ref 32.0–36.0)
MCV: 90.4 fL (ref 80.0–100.0)
MPV: 10 fL (ref 7.5–12.5)
Monocytes Relative: 5.8 %
Neutro Abs: 1884 cells/uL (ref 1500–7800)
Neutrophils Relative %: 55.4 %
Platelets: 235 10*3/uL (ref 140–400)
RBC: 3.76 10*6/uL — ABNORMAL LOW (ref 3.80–5.10)
RDW: 12.4 % (ref 11.0–15.0)
Total Lymphocyte: 36.4 %
WBC: 3.4 10*3/uL — ABNORMAL LOW (ref 3.8–10.8)

## 2018-06-11 LAB — LIPID PANEL
Cholesterol: 239 mg/dL — ABNORMAL HIGH (ref <200)
HDL: 90 mg/dL (ref >=50)
LDL Cholesterol (Calc): 134 mg/dL (calc) — ABNORMAL HIGH
Non-HDL Cholesterol (Calc): 149 mg/dL (calc) — ABNORMAL HIGH (ref <130)
Total CHOL/HDL Ratio: 2.7 (calc) (ref <5.0)
Triglycerides: 56 mg/dL (ref <150)

## 2018-06-11 LAB — HEMOGLOBIN A1C
Hgb A1c MFr Bld: 5.4 % of total Hgb (ref <5.7)
Mean Plasma Glucose: 108 (calc)
eAG (mmol/L): 6 (calc)

## 2018-06-11 LAB — MAGNESIUM: Magnesium: 2.2 mg/dL (ref 1.5–2.5)

## 2018-06-11 LAB — TSH: TSH: 0.67 mIU/L (ref 0.40–4.50)

## 2018-07-08 DIAGNOSIS — H401132 Primary open-angle glaucoma, bilateral, moderate stage: Secondary | ICD-10-CM | POA: Diagnosis not present

## 2018-07-08 DIAGNOSIS — H2512 Age-related nuclear cataract, left eye: Secondary | ICD-10-CM | POA: Diagnosis not present

## 2018-07-20 ENCOUNTER — Other Ambulatory Visit: Payer: Self-pay | Admitting: Internal Medicine

## 2018-07-20 DIAGNOSIS — Z1231 Encounter for screening mammogram for malignant neoplasm of breast: Secondary | ICD-10-CM

## 2018-08-11 ENCOUNTER — Ambulatory Visit: Payer: Self-pay | Admitting: Adult Health

## 2018-08-13 ENCOUNTER — Ambulatory Visit (INDEPENDENT_AMBULATORY_CARE_PROVIDER_SITE_OTHER): Payer: PPO | Admitting: Physician Assistant

## 2018-08-13 ENCOUNTER — Encounter: Payer: Self-pay | Admitting: Physician Assistant

## 2018-08-13 ENCOUNTER — Other Ambulatory Visit: Payer: Self-pay

## 2018-08-13 VITALS — BP 132/82 | HR 81 | Temp 97.9°F | Ht 64.5 in | Wt 145.0 lb

## 2018-08-13 DIAGNOSIS — M25562 Pain in left knee: Secondary | ICD-10-CM | POA: Diagnosis not present

## 2018-08-13 MED ORDER — MELOXICAM 15 MG PO TABS: ORAL_TABLET | ORAL | 1 refills | Status: DC

## 2018-08-13 NOTE — Patient Instructions (Signed)
Chronic Knee Pain, Adult Chronic knee pain is pain in one or both knees that lasts longer than 3 months. Symptoms of chronic knee pain may include swelling, stiffness, and discomfort. Age-related wear and tear (osteoarthritis) of the knee joint is the most common cause of chronic knee pain. Other possible causes include:  A long-term immune-related disease that causes inflammation of the knee (rheumatoid arthritis). This usually affects both knees.  Inflammatory arthritis, such as gout or pseudogout.  An injury to the knee that causes arthritis.  An injury to the knee that damages the ligaments. Ligaments are strong tissues that connect bones to each other.  Runner's knee or pain behind the kneecap. Treatment for chronic knee pain depends on the cause. The main treatments for chronic knee pain are physical therapy and weight loss. This condition may also be treated with medicines, injections, a knee sleeve or brace, and by using crutches. Rest, ice, compression (pressure), and elevation (RICE) therapy may also be recommended. Follow these instructions at home: If you have a knee sleeve or brace:   Wear it as told by your health care provider. Remove it only as told by your health care provider.  Loosen it if your toes tingle, become numb, or turn cold and blue.  Keep it clean.  If the sleeve or brace is not waterproof: ? Do not let it get wet. ? Remove it if allowed by your health care provider, or cover it with a watertight covering when you take a bath or a shower. Managing pain, stiffness, and swelling      If directed, apply heat to the affected area as often as told by your health care provider. Use the heat source that your health care provider recommends, such as a moist heat pack or a heating pad. ? If you have a removable sleeve or brace, remove it as told by your health care provider. ? Place a towel between your skin and the heat source. ? Leave the heat on for 20-30  minutes. ? Remove the heat if your skin turns bright red. This is especially important if you are unable to feel pain, heat, or cold. You may have a greater risk of getting burned.  If directed, put ice on the affected area. ? If you have a removable sleeve or brace, remove it as told by your health care provider. ? Put ice in a plastic bag. ? Place a towel between your skin and the bag. ? Leave the ice on for 20 minutes, 2-3 times a day.  Move your toes often to reduce stiffness and swelling.  Raise (elevate) the injured area above the level of your heart while you are sitting or lying down. Activity  Avoid activities where both feet leave the ground at the same time (high-impact activities). Examples are running, jumping rope, and doing jumping jacks.  Return to your normal activities as told by your health care provider. Ask your health care provider what activities are safe for you.  Follow the exercise plan that your health care provider designed for you. Your health care provider may suggest that you: ? Avoid activities that make knee pain worse. This may require you to change your exercise routines, sport participation, or job duties. ? Wear shoes with cushioned soles. ? Avoid high-impact activities or sports that require running and sudden changes in direction. ? Do physical therapy as told by your health care provider. Physical therapy is planned to match your needs and abilities. It may include  exercises for strength, flexibility, stability, and endurance. ? Do exercises that increase balance and strength, such as tai chi and yoga.  Do not use the injured limb to support your body weight until your health care provider says that you can. Use crutches, a cane, or a walker, as told by your health care provider. General instructions  Take over-the-counter and prescription medicines only as told by your health care provider.  Lose weight if you are overweight. Losing even a little  weight can reduce knee pain. Ask your health care provider what your ideal weight is, and how to safely lose extra weight. A food expert (dietitian) may be able to help you plan your meals.  Do not use any products that contain nicotine or tobacco, such as cigarettes, e-cigarettes, and chewing tobacco. These can delay healing. If you need help quitting, ask your health care provider.  Keep all follow-up visits as told by your health care provider. This is important. Contact a health care provider if:  You have knee pain that is not getting better or gets worse.  You are unable to do your physical therapy exercises due to knee pain. Get help right away if:  Your knee swells and the swelling becomes worse.  You cannot move your knee.  You have severe knee pain. Summary  Knee pain that lasts more than 3 months is considered chronic knee pain.  The main treatments for chronic knee pain are physical therapy and weight loss. You may also need to take medicines, wear a knee sleeve or brace, use crutches, and apply ice or heat.  Losing even a little weight can reduce knee pain. Ask your health care provider what your ideal weight is, and how to safely lose extra weight. A food expert (dietitian) may be able to help you plan your meals.  Work with a physical therapist to make a safe exercise program, as told by your health care provider. This information is not intended to replace advice given to you by your health care provider. Make sure you discuss any questions you have with your health care provider. Document Released: 03/12/2018 Document Revised: 03/12/2018 Document Reviewed: 03/12/2018 Elsevier Patient Education  2020 [employer].

## 2018-08-13 NOTE — Progress Notes (Signed)
   Subjective:    Patient ID: Robin Zimmerman, female    DOB: December 30, 1940, 78 y.o.   MRN: [id number]  HPI 78 y.o. AAF presents with left knee pain x 1.5 weeks.  States she went under the bushes to get a ball about 2 weeks ago and slipped under the bushes but she did not hurt her knee at that time.  Achy medial knee pain that will go around to the back of her leg. States some swelling at her knee, no warmth. No back pain, hip pain, ankle pain.  Worse standing up, going up stairs.  No popping, clicking, or giving out on her knee.   Has been icing at night. Has been taking aleve, a total of 2 pills for one day.    Blood pressure 132/82, pulse 81, temperature 97.9 F (36.6 C), height 5' 4.5" (1.638 m), weight 145 lb (65.8 kg), SpO2 98 %.  Medications Current Outpatient Medications on File Prior to Visit  Medication Sig  . CALCIUM PO Take by mouth daily.  . Cholecalciferol (VITAMIN D PO) Take 5,000 Int'l Units by mouth daily.  . IRON PO Take by mouth daily.  Marland Kitchen latanoprost (XALATAN) 0.005 % ophthalmic solution    No current facility-administered medications on file prior to visit.     Problem list She has GERD (gastroesophageal reflux disease); Hyperlipidemia; Hypertension; Other abnormal glucose; Vitamin D deficiency; Medication management; Benign neoplasm of colon; Glaucoma; Absolute anemia; Encounter for Medicare annual wellness exam; Posterior tibial tendinitis of right lower extremity; and Pes planus on their problem list.    Review of Systems     Objective:   Physical Exam Constitutional:      Appearance: She is well-developed.  Cardiovascular:     Rate and Rhythm: Normal rate and regular rhythm.  Pulmonary:     Effort: Pulmonary effort is normal.     Breath sounds: Normal breath sounds.  Abdominal:     General: Bowel sounds are normal.     Palpations: Abdomen is soft.  Musculoskeletal: Normal range of motion.     Comments: Patient is able to ambulate well.    Gait is  antalgic. left knee with crepitus no effusion, no erythema, ACL stable, MCL stable, LCL stable, McMurray's negative and FROM  Skin:    General: Skin is warm and dry.     Findings: No rash.  Neurological:     Mental Status: She is alert and oriented to person, place, and time.        Assessment & Plan:  Knee Pain, Degenrative Arthritis of the knee) Natural history and expected course discussed. Questions answered. Rest, ice, compression, and elevation (RICE) therapy. Patellar compression sleeve. Quad strengthening exercises. NSAIDs per medication orders. ortho referral if not better  Likely OA Will do mobic x 2 weeks If not better will refer to ortho

## 2018-08-31 NOTE — Progress Notes (Signed)
3 month OV  Assessment:    Essential hypertension - continue medications, DASH diet, exercise and monitor at home. Call if greater than 130/80.  - CBC with Differential/Platelet - BASIC METABOLIC PANEL WITH GFR - Hepatic function panel  Acute pain of left knee -     Ambulatory referral to Orthopedics Likely OA versus meniscal tear or possibly both with age, will refer to ortho for evaluation Patient is not as active or walking as much due to pain Continue brace and mobic PRN  Hemorrhoids -     hydrocortisone cream 1 %; Apply to affected area 2 times daily   Hypothyroidism, unspecified hypothyroidism type - TSH Hypothyroidism-check TSH level, continue medications the same, reminded to take on an empty stomach 30-24mins before food.   Vitamin D deficiency - Vit D  25 hydroxy (rtn osteoporosis monitoring)   Medication management - Magnesium  Hyperlipidemia -check lipids, decrease fatty foods, increase activity.  - Lipid panel   Future Appointments  Date Time Provider [place]  09/03/2018  9:40 AM [facility] 3 [facility]  12/15/2018 11:00 AM Vicie Mutters, PA-C [facility] None    Subjective:   Robin Zimmerman is a 78 y.o. female who presents for  3 month follow up for HTN, chol, preDM.   She continues to have left knee pain, worse with walking or when she first stands, wears brace and takes mobic that helps. Starting to have medial catching in her knee.  Her blood pressure has been controlled at home, today their BP is BP: 132/70 She does not workout, very sporadic.  She denies chest pain, shortness of breath, dizziness.  She is not on cholesterol medication and denies myalgias. Her cholesterol is at goal. The cholesterol last visit was:   Lab Results  Component Value Date   CHOL 239 (H) 06/10/2018   HDL 90 06/10/2018   LDLCALC 134 (H) 06/10/2018   TRIG 56 06/10/2018   CHOLHDL 2.7 06/10/2018   She has been working on diet and exercise for  prediabetes, and denies polydipsia, polyuria and visual disturbances. Last A1C in the office was:  Lab Results  Component Value Date   HGBA1C 5.4 06/10/2018   Patient is on Vitamin D supplement, 5000 IU daily. Lab Results  Component Value Date   VD25OH 5 02/16/2018   She is not on a thyroid medications.  Lab Results  Component Value Date   TSH 0.67 06/10/2018   BMI is Body mass index is 24.71 kg/m., she is working on diet and exercise. Wt Readings from Last 3 Encounters:  09/02/18 146 lb 3.2 oz (66.3 kg)  08/13/18 145 lb (65.8 kg)  06/10/18 144 lb (65.3 kg)     Medication Review Current Outpatient Medications on File Prior to Visit  Medication Sig Dispense Refill  . CALCIUM PO Take by mouth daily.    . Cholecalciferol (VITAMIN D PO) Take 5,000 Int'l Units by mouth daily.    . IRON PO Take by mouth daily.    Marland Kitchen latanoprost (XALATAN) 0.005 % ophthalmic solution     . meloxicam (MOBIC) 15 MG tablet Take one daily with food for 2 weeks, can take with tylenol, can not take with aleve, iburpofen, then as needed daily for pain 30 tablet 1   No current facility-administered medications on file prior to visit.     Current Problems (verified) Patient Active Problem List   Diagnosis Date Noted  . Pes planus 02/15/2016  . Posterior tibial tendinitis of right lower  extremity 01/25/2016  . Encounter for Medicare annual wellness exam 01/18/2016  . Absolute anemia 07/20/2014  . Glaucoma 04/14/2014  . Vitamin D deficiency 04/04/2013  . Medication management 04/04/2013  . Other abnormal glucose 01/19/2013  . GERD (gastroesophageal reflux disease)   . Hyperlipidemia   . Hypertension   . Benign neoplasm of colon 02/16/2007    Allergies Allergies  Allergen Reactions  . Prednisone Other (See Comments)    Hallucinations    SURGICAL HISTORY She  has a past surgical history that includes Tubal ligation; Cholecystectomy; and Cataract extraction (Right, 06/16/2017). FAMILY  HISTORY Her family history includes Alzheimer's disease in her father; Heart disease in her mother. SOCIAL HISTORY She  reports that she quit smoking about 43 years ago. She has never used smokeless tobacco. She reports that she does not drink alcohol or use drugs.   Objective:     Blood pressure 132/70, pulse 82, temperature (!) 97.5 F (36.4 C), weight 146 lb 3.2 oz (66.3 kg), SpO2 98 %. Body mass index is 24.71 kg/m.  General appearance: alert, no distress, WD/WN,  female HEENT: normocephalic, sclerae anicteric, TMs pearly, nares patent, no discharge or erythema, pharynx normal Oral cavity: MMM, no lesions Neck: supple, no lymphadenopathy, no thyromegaly, no masses Heart: RRR, normal S1, S2, no murmurs Lungs: CTA bilaterally, no wheezes, rhonchi, or rales Abdomen: +bs, soft, non tender, non distended, no masses, no hepatomegaly, no splenomegaly Musculoskeletal: Gait is antalgic. left knee with crepitus no effusion, no erythema, ACL stable, MCL stable, LCL stable, McMurray's negative and FROM nontender, no swelling, no obvious deformity Extremities: no edema, no cyanosis, no clubbing Skin: Seb keratosis over back and AB Pulses: 2+ symmetric, upper and lower extremities, normal cap refill Neurological: alert, oriented x 3, CN2-12 intact, strength normal upper extremities and lower extremities, sensation normal throughout, DTRs 2+ throughout, no cerebellar signs, gait normal Psychiatric: normal affect, behavior normal, pleasant    Vicie Mutters, PA-C   09/02/2018

## 2018-09-02 ENCOUNTER — Encounter: Payer: Self-pay | Admitting: Physician Assistant

## 2018-09-02 ENCOUNTER — Other Ambulatory Visit: Payer: Self-pay

## 2018-09-02 ENCOUNTER — Ambulatory Visit (INDEPENDENT_AMBULATORY_CARE_PROVIDER_SITE_OTHER): Payer: PPO | Admitting: Physician Assistant

## 2018-09-02 VITALS — BP 132/70 | HR 82 | Temp 97.5°F | Wt 146.2 lb

## 2018-09-02 DIAGNOSIS — E785 Hyperlipidemia, unspecified: Secondary | ICD-10-CM

## 2018-09-02 DIAGNOSIS — I10 Essential (primary) hypertension: Secondary | ICD-10-CM

## 2018-09-02 DIAGNOSIS — R7309 Other abnormal glucose: Secondary | ICD-10-CM

## 2018-09-02 DIAGNOSIS — Z79899 Other long term (current) drug therapy: Secondary | ICD-10-CM

## 2018-09-02 DIAGNOSIS — M25562 Pain in left knee: Secondary | ICD-10-CM

## 2018-09-02 DIAGNOSIS — D649 Anemia, unspecified: Secondary | ICD-10-CM | POA: Diagnosis not present

## 2018-09-02 DIAGNOSIS — E559 Vitamin D deficiency, unspecified: Secondary | ICD-10-CM | POA: Diagnosis not present

## 2018-09-02 MED ORDER — HYDROCORTISONE 1 % EX CREA: TOPICAL_CREAM | CUTANEOUS | 1 refills | Status: AC

## 2018-09-02 NOTE — Patient Instructions (Signed)
Check out squatty potty Check out bidet's About Hemorrhoids  Hemorrhoids are swollen veins in the lower rectum and anus.  Also called piles, hemorrhoids are a common problem.  Hemorrhoids may be internal (inside the rectum) or external (around the anus).  Internal Hemorrhoids  Internal hemorrhoids are often painless, but they rarely cause bleeding.  The internal veins may stretch and fall down (prolapse) through the anus to the outside of the body.  The veins may then become irritated and painful.  External Hemorrhoids  External hemorrhoids can be easily seen or felt around the anal opening.  They are under the skin around the anus.  When the swollen veins are scratched or broken by straining, rubbing or wiping they sometimes bleed.  How Hemorrhoids Occur  Veins in the rectum and around the anus tend to swell under pressure.  Hemorrhoids can result from increased pressure in the veins of your anus or rectum.  Some sources of pressure are:   Straining to have a bowel movement because of constipation  Waiting too long to have a bowel movement  Coughing and sneezing often  Sitting for extended periods of time, including on the toilet  Diarrhea  Obesity  Trauma or injury to the anus  Some liver diseases  Stress  Family history of hemorrhoids  Pregnancy  Pregnant women should try to avoid becoming constipated, because they are more likely to have hemorrhoids during pregnancy.  In the last trimester of pregnancy, the enlarged uterus may press on blood vessels and causes hemorrhoids.  In addition, the strain of childbirth sometimes causes hemorrhoids after the birth.  Symptoms of Hemorrhoids  Some symptoms of hemorrhoids include:  Swelling and/or a tender lump around the anus  Itching, mild burning and bleeding around the anus  Painful bowel movements with or without constipation  Bright red blood covering the stool, on toilet paper or in the toilet bowel.   Symptoms  usually go away within a few days.  Always talk to your doctor about any bleeding to make sure it is not from some other causes.  Diagnosing and Treating Hemorrhoids  Diagnosis is made by an examination by your healthcare provider.  Special test can be performed by your doctor.    Most cases of hemorrhoids can be treated with:  High-fiber diet: Eat more high-fiber foods, which help prevent constipation.  Ask for more detailed fiber information on types and sources of fiber from your healthcare provider.  Fluids: Drink plenty of water.  This helps soften bowel movements so they are easier to pass.  Sitz baths and cold packs: Sitting in lukewarm water two or three times a day for 15 minutes cleases the anal area and may relieve discomfort.  If the water is too hot, swelling around the anus will get worse.  Placing a cloth-covered ice pack on the anus for ten minutes four times a day can also help reduce selling.  Gently pushing a prolapsed hemorrhoid back inside after the bath or ice pack can be helpful.  Medications: For mild discomfort, your healthcare provider may suggest over-the-counter pain medication or prescribe a cream or ointment for topical use.  The cream may contain witch hazel, zinc oxide or petroleum jelly.  Medicated suppositories are also a treatment option.  Always consult your doctor before applying medications or creams.  Procedures and surgeries: There are also a number of procedures and surgeries to shrink or remove hemorrhoids in more serious cases.  Talk to your physician about these options.  You can often prevent hemorrhoids or keep them from becoming worse by maintaining a healthy lifestyle.  Eat a fiber-rich diet of fruits, vegetables and whole grains.  Also, drink plenty of water and exercise regularly.   2007, [employer] Doc.30

## 2018-09-03 ENCOUNTER — Ambulatory Visit
Admission: RE | Admit: 2018-09-03 | Discharge: 2018-09-03 | Disposition: A | Discharge status: Home / Self Care | Payer: PPO | Source: Ambulatory Visit | Attending: Internal Medicine | Admitting: Internal Medicine

## 2018-09-03 DIAGNOSIS — Z1231 Encounter for screening mammogram for malignant neoplasm of breast: Secondary | ICD-10-CM | POA: Diagnosis not present

## 2018-09-03 LAB — COMPLETE METABOLIC PANEL WITH GFR
AG Ratio: 1.4 (calc) (ref 1.0–2.5)
ALT: 8 U/L (ref 6–29)
AST: 13 U/L (ref 10–35)
Albumin: 4 g/dL (ref 3.6–5.1)
Alkaline phosphatase (APISO): 58 U/L (ref 37–153)
BUN/Creatinine Ratio: 15 (calc) (ref 6–22)
BUN: 15 mg/dL (ref 7–25)
CO2: 29 mmol/L (ref 20–32)
Calcium: 9.4 mg/dL (ref 8.6–10.4)
Chloride: 105 mmol/L (ref 98–110)
Creat: 0.99 mg/dL — ABNORMAL HIGH (ref 0.60–0.93)
GFR, Est African American: 64 mL/min/{1.73_m2} (ref >=60)
GFR, Est Non African American: 55 mL/min/{1.73_m2} — ABNORMAL LOW (ref >=60)
Globulin: 2.9 g/dL (calc) (ref 1.9–3.7)
Glucose, Bld: 82 mg/dL (ref 65–99)
Potassium: 4.2 mmol/L (ref 3.5–5.3)
Sodium: 140 mmol/L (ref 135–146)
Total Bilirubin: 0.5 mg/dL (ref 0.2–1.2)
Total Protein: 6.9 g/dL (ref 6.1–8.1)

## 2018-09-03 LAB — CBC WITH DIFFERENTIAL/PLATELET
Absolute Monocytes: 211 cells/uL (ref 200–950)
Basophils Absolute: 20 cells/uL (ref 0–200)
Basophils Relative: 0.6 %
Eosinophils Absolute: 109 cells/uL (ref 15–500)
Eosinophils Relative: 3.3 %
HCT: 32.5 % — ABNORMAL LOW (ref 35.0–45.0)
Hemoglobin: 10.7 g/dL — ABNORMAL LOW (ref 11.7–15.5)
Lymphs Abs: 1155 cells/uL (ref 850–3900)
MCH: 29.7 pg (ref 27.0–33.0)
MCHC: 32.9 g/dL (ref 32.0–36.0)
MCV: 90.3 fL (ref 80.0–100.0)
MPV: 9.9 fL (ref 7.5–12.5)
Monocytes Relative: 6.4 %
Neutro Abs: 1805 cells/uL (ref 1500–7800)
Neutrophils Relative %: 54.7 %
Platelets: 261 10*3/uL (ref 140–400)
RBC: 3.6 10*6/uL — ABNORMAL LOW (ref 3.80–5.10)
RDW: 12.1 % (ref 11.0–15.0)
Total Lymphocyte: 35 %
WBC: 3.3 10*3/uL — ABNORMAL LOW (ref 3.8–10.8)

## 2018-09-03 LAB — LIPID PANEL
Cholesterol: 206 mg/dL — ABNORMAL HIGH (ref <200)
HDL: 82 mg/dL (ref >=50)
LDL Cholesterol (Calc): 109 mg/dL (calc) — ABNORMAL HIGH
Non-HDL Cholesterol (Calc): 124 mg/dL (calc) (ref <130)
Total CHOL/HDL Ratio: 2.5 (calc) (ref <5.0)
Triglycerides: 66 mg/dL (ref <150)

## 2018-09-03 LAB — TSH: TSH: 0.69 mIU/L (ref 0.40–4.50)

## 2018-09-03 LAB — MAGNESIUM: Magnesium: 2.2 mg/dL (ref 1.5–2.5)

## 2018-09-03 LAB — VITAMIN D 25 HYDROXY (VIT D DEFICIENCY, FRACTURES): Vit D, 25-Hydroxy: 61 ng/mL (ref 30–100)

## 2018-09-15 ENCOUNTER — Encounter (INDEPENDENT_AMBULATORY_CARE_PROVIDER_SITE_OTHER): Payer: Self-pay

## 2018-09-15 ENCOUNTER — Other Ambulatory Visit: Payer: Self-pay

## 2018-09-15 ENCOUNTER — Encounter: Payer: Self-pay | Admitting: Orthopaedic Surgery

## 2018-09-15 ENCOUNTER — Ambulatory Visit: Payer: Self-pay

## 2018-09-15 ENCOUNTER — Ambulatory Visit (INDEPENDENT_AMBULATORY_CARE_PROVIDER_SITE_OTHER): Payer: PPO | Admitting: Orthopaedic Surgery

## 2018-09-15 VITALS — BP 139/75 | HR 82 | Ht 64.5 in | Wt 145.0 lb

## 2018-09-15 DIAGNOSIS — M25562 Pain in left knee: Secondary | ICD-10-CM

## 2018-09-15 DIAGNOSIS — M1712 Unilateral primary osteoarthritis, left knee: Secondary | ICD-10-CM | POA: Diagnosis not present

## 2018-09-15 DIAGNOSIS — G8929 Other chronic pain: Secondary | ICD-10-CM

## 2018-09-15 MED ORDER — METHYLPREDNISOLONE ACETATE 40 MG/ML IJ SUSP
80.0000 mg | INTRAMUSCULAR | Status: AC | PRN
  Administered 2018-09-15: 80 mg via INTRA_ARTICULAR

## 2018-09-15 MED ORDER — BUPIVACAINE HCL 0.25 % IJ SOLN
2.0000 mL | INTRAMUSCULAR | Status: AC | PRN
  Administered 2018-09-15: 2 mL via INTRA_ARTICULAR

## 2018-09-15 MED ORDER — LIDOCAINE HCL 1 % IJ SOLN
2.0000 mL | INTRAMUSCULAR | Status: AC | PRN
  Administered 2018-09-15: 2 mL

## 2018-09-15 NOTE — Progress Notes (Signed)
Office Visit Note   Patient: Robin Zimmerman           Date of Birth: 1940-09-18           MRN: [id number] Visit Date: 09/15/2018              Requested by: Vicie Mutters, PA-C [address] [place],  [place] [address] PCP: Unk Pinto, MD   Assessment & Plan: Visit Diagnoses:  1. Chronic pain of left knee   2. Unilateral primary osteoarthritis, left knee     Plan:  #1: Corticosteroid injection to the left knee was given without difficulty.  Tolerated the procedure well. #2: If she does not have benefit with this she will make another appointment to follow back up. #3: She will ice the knee for the next 24 to 48 hours and limit her activity as discussed.  Follow-Up Instructions: Return if symptoms worsen or fail to improve.   Orders:  Orders Placed This Encounter  Procedures  . XR KNEE 3 VIEW LEFT   No orders of the defined types were placed in this encounter.     Procedures: Large Joint Inj: L knee on 09/15/2018 9:43 AM Indications: pain and diagnostic evaluation Details: 25 G 1.5 in needle, anteromedial approach  Arthrogram: No  Medications: 2 mL lidocaine 1 %; 80 mg methylPREDNISolone acetate 40 MG/ML; 2 mL bupivacaine 0.25 % Outcome: tolerated well, no immediate complications Procedure, treatment alternatives, risks and benefits explained, specific risks discussed. Consent was given by the patient. Patient was prepped and draped in the usual sterile fashion.       Clinical Data: No additional findings.   Subjective: Chief Complaint  Patient presents with  . Left Knee - Pain     HPI Patient presents today for left knee pain. She known injury that she knows of. Her knee has been hurting for about 6 weeks. She saw her PCP and was told it may be arthritis and was given Meloxicam to take.  She states that while she was on the meloxicam she did have some benefit.  However once this was stopped her pain is returned.  Her pain is  located medially and "catches". Her pain is on and off. She is wearing an OTC knee sleeve and that seems to offer some relief.  Her symptoms were exacerbated previously at about 6 weeks ago when she was playing a game and the object apparently went under a shrub and she was down on her hands and knees going after this.   Review of Systems  Constitutional: Negative for fatigue.  HENT: Negative for ear pain.   Eyes: Negative for pain.  Respiratory: Negative for shortness of breath.   Cardiovascular: Negative for leg swelling.  Gastrointestinal: Negative for constipation and diarrhea.  Endocrine: Positive for cold intolerance. Negative for heat intolerance.  Genitourinary: Negative for difficulty urinating.  Musculoskeletal: Positive for joint swelling.  Skin: Negative for rash.  Allergic/Immunologic: Negative for food allergies.  Neurological: Negative for weakness.  Hematological: Does not bruise/bleed easily.  Psychiatric/Behavioral: Negative for sleep disturbance.     Objective: Vital Signs: BP 139/75   Pulse 82   Ht 5' 4.5" (1.638 m)   Wt 145 lb (65.8 kg)   BMI 24.50 kg/m   Physical Exam Constitutional:      Appearance: She is well-developed.  Eyes:     Pupils: Pupils are equal, round, and reactive to light.  Pulmonary:     Effort: Pulmonary effort is normal.  Skin:    General: Skin is warm and dry.  Neurological:     Mental Status: She is alert and oriented to person, place, and time.  Psychiatric:        Behavior: Behavior normal.     Ortho Exam  Exam today reveals the skin to be intact without erythema or ecchymosis.  She has range of motion from near full extension to about 100-105 degrees.  She has tenderness palpation over the medial joint line.  Less laterally.  She does have a mild effusion without warmth.  Does have marked crepitance with patellofemoral trapping.  This also caused her pain.  Some tenderness over the medial joint line also.  Not much over the  lateral.  She does have some tenderness over the superficial femoral vein in the proximal calf.  Nothing posteriorly.  Good distal posteriortib and dorsalis pedis.  No pitting edema. Specialty Comments:  No specialty comments available.  Imaging: Xr Knee 3 View Left  Result Date: 09/15/2018 Three-view x-ray of the right knee reveals maintenance of joint space on the AP there there is some narrowing on the medial compartment.  She does have some mild periarticular spurring along the medial proximal tibia.  Some translation of the tibia laterally on the femur.  On the lateral there are some changes in the femoral condyle.  Some narrowing of the patellofemoral joint also noted.  Periarticular spurring noted.  Sunrise view does show patellofemoral degenerative changes and spurring also noted still maintaining joint space.    PMFS History: Current Outpatient Medications  Medication Sig Dispense Refill  . CALCIUM PO Take by mouth daily.    . Cholecalciferol (VITAMIN D PO) Take 5,000 Int'l Units by mouth daily.    . hydrocortisone cream 1 % Apply to affected area 2 times daily 30 g 1  . IRON PO Take by mouth daily.    Marland Kitchen latanoprost (XALATAN) 0.005 % ophthalmic solution     . meloxicam (MOBIC) 15 MG tablet Take one daily with food for 2 weeks, can take with tylenol, can not take with aleve, iburpofen, then as needed daily for pain (Patient not taking: Reported on 09/15/2018) 30 tablet 1   No current facility-administered medications for this visit.     Patient Active Problem List   Diagnosis Date Noted  . Pes planus 02/15/2016  . Posterior tibial tendinitis of right lower extremity 01/25/2016  . Encounter for Medicare annual wellness exam 01/18/2016  . Absolute anemia 07/20/2014  . Glaucoma 04/14/2014  . Vitamin D deficiency 04/04/2013  . Medication management 04/04/2013  . Other abnormal glucose 01/19/2013  . GERD (gastroesophageal reflux disease)   . Hyperlipidemia   . Hypertension   .  Benign neoplasm of colon 02/16/2007   Past Medical History:  Diagnosis Date  . Anemia   . GERD (gastroesophageal reflux disease)   . Glaucoma   . Hyperlipidemia   . Hypertension   . Thyroid disease     Family History  Problem Relation Age of Onset  . Heart disease Mother   . Alzheimer's disease Father     Past Surgical History:  Procedure Laterality Date  . CATARACT EXTRACTION Right 06/16/2017   Dr. Cher Nakai  . CHOLECYSTECTOMY    . TUBAL LIGATION     Social History   Occupational History  . Not on file  Tobacco Use  . Smoking status: Former Smoker    Quit date: 09/15/1974    Years since quitting: 44.0  . Smokeless tobacco: Never Used  Substance and Sexual Activity  . Alcohol use: No    Alcohol/week: 0.0 standard drinks  . Drug use: No  . Sexual activity: Not on file

## 2018-11-20 ENCOUNTER — Encounter: Payer: Self-pay | Admitting: Physician Assistant

## 2018-12-15 ENCOUNTER — Encounter: Payer: Self-pay | Admitting: Physician Assistant

## 2019-01-06 NOTE — Progress Notes (Signed)
CPE AND 3 month OV  Assessment:   Gastroesophageal reflux disease, unspecified whether esophagitis present -     famotidine (PEPCID) 40 MG tablet; Take 1 tablet (40 mg total) by mouth every evening. + Epigastric pain, will treat with pepcid, diet discussed.  Throat clearing -     famotidine (PEPCID) 40 MG tablet; Take 1 tablet (40 mg total) by mouth every evening. Normal evaluation, normal thyroid, no lymphadenopathy ? From allergies versus GERD, will treat GERD, if not better can try claritin or will refer to ENT for evaluation. Patient agrees with pain.   Encounter for general adult medical examination with abnormal findings 1 year Suggest getting shingles vaccine at least- info given  Hyperlipidemia, unspecified hyperlipidemia type -     Lipid Profile check lipids decrease fatty foods increase activity.   Essential hypertension -     CBC with Diff -     COMPLETE METABOLIC PANEL WITH GFR -     TSH -     Urinalysis, Routine w reflex microscopic -     Microalbumin / Creatinine Urine Ratio -     EKG 12-Lead - continue medications, DASH diet, exercise and monitor at home. Call if greater than 130/80.   Medication management -     Magnesium  Other abnormal glucose -     Hemoglobin A1c ([facility]) Discussed disease progression and risks Discussed diet/exercise, weight management and risk modification  Vitamin D deficiency -     Vitamin D (25 hydroxy)  Anemia, unspecified type Check CBC  Glaucoma, unspecified glaucoma type, unspecified laterality Continue follow up eye doctor  Benign neoplasm of colon, unspecified part of colon Discussed colonoscopy versus cologuard, discussed colonoscopy is better however something is better than nothing.    Future Appointments  Date Time Provider [place]  01/13/2020 10:00 AM Vicie Mutters, PA-C [id number] None     Subjective:   Robin Zimmerman is a non smoking 78 y.o. female who presents for CPE and 3 month  follow up for HTN, chol, glucose management, vitamin D.   She complains of throat clearing, states she has had PND and has throat clearing in the past but it is getting worse. Worse in the AM. She is not on anything for nasal drip. "feels stuck" in her throat.  No sore throat, no trouble swallowing, no pills/food getting caught.   BMI is Body mass index is 25.32 kg/m., she has been working on diet and exercise. Wt Readings from Last 3 Encounters:  01/13/19 145 lb 3.2 oz (65.9 kg)  09/15/18 145 lb (65.8 kg)  09/02/18 146 lb 3.2 oz (66.3 kg)   Her blood pressure has been controlled at home, today their BP is BP: 124/78 She does not workout, very sporadic.  She denies chest pain, shortness of breath, dizziness.   She is not on cholesterol medication and denies myalgias. Her cholesterol is not at goal. The cholesterol last visit was:   Lab Results  Component Value Date   CHOL 206 (H) 09/02/2018   HDL 82 09/02/2018   LDLCALC 109 (H) 09/02/2018   TRIG 66 09/02/2018   CHOLHDL 2.5 09/02/2018   She has been working on diet and exercise for glucose management, and denies polydipsia, polyuria and visual disturbances. Last A1C in the office was:  Lab Results  Component Value Date   HGBA1C 5.4 06/10/2018   Patient is on Vitamin D supplement, 10000 IU daily. Lab Results  Component Value Date   VD25OH 61 09/02/2018  Medication Review     Current Outpatient Medications (Analgesics):  .  meloxicam (MOBIC) 15 MG tablet, Take one daily with food for 2 weeks, can take with tylenol, can not take with aleve, iburpofen, then as needed daily for pain  Current Outpatient Medications (Hematological):  Marland Kitchen  IRON PO, Take by mouth daily.  Current Outpatient Medications (Other):  Marland Kitchen  CALCIUM PO, Take by mouth daily. .  Cholecalciferol (VITAMIN D PO), Take 5,000 Int'l Units by mouth daily. .  hydrocortisone cream 1 %, Apply to affected area 2 times daily .  latanoprost (XALATAN) 0.005 %  ophthalmic solution,   Current Problems (verified) Patient Active Problem List   Diagnosis Date Noted  . Pes planus 02/15/2016  . Posterior tibial tendinitis of right lower extremity 01/25/2016  . Encounter for Medicare annual wellness exam 01/18/2016  . Absolute anemia 07/20/2014  . Glaucoma 04/14/2014  . Vitamin D deficiency 04/04/2013  . Medication management 04/04/2013  . Other abnormal glucose 01/19/2013  . GERD (gastroesophageal reflux disease)   . Hyperlipidemia   . Hypertension   . Benign neoplasm of colon 02/16/2007    Screening Tests Immunization History  Administered Date(s) Administered  . Td 06/30/2012    Preventative care: Last colonoscopy: 2014 due 09/2017- will consider cologuard though we discussed that the colonoscopy is a better test Last mammogram: 08/2018 Last pap smear/pelvic exam: 2012  remote DEXA: 2017 -0.9 normal  Prior vaccinations: TD or Tdap: 2014 Influenza: declines Prevnar 13 Declines Pneumococcal: declines Shingles/Zostavax: discussed  Names of Other Physician/Practitioners you currently use: 1. [facility]- here for primary care 2. Dr. Carollee Sires, [facility], eye doctor, last visit 2019 3. , dental, last visit - remote, encouraged to follow up   Patient Care Team: Unk Pinto, MD as PCP - General (Internal Medicine) Juanita Craver, MD as Consulting Physician (Gastroenterology)   Allergies Allergies  Allergen Reactions  . Prednisone Other (See Comments)    Hallucinations    SURGICAL HISTORY She  has a past surgical history that includes Tubal ligation; Cholecystectomy; and Cataract extraction (Right, 06/16/2017). FAMILY HISTORY Her family history includes Alzheimer's disease in her father; Heart disease in her mother. SOCIAL HISTORY She  reports that she quit smoking about 44 years ago. She has never used smokeless tobacco. She reports that she does not drink alcohol or use  drugs.   Objective:     Blood pressure 124/78, pulse 81, temperature 97.7 F (36.5 C), height 5' 3.5" (1.613 m), weight 145 lb 3.2 oz (65.9 kg), SpO2 97 %. Body mass index is 25.32 kg/m.  General appearance: alert, no distress, WD/WN,  female HEENT: normocephalic, sclerae anicteric, TMs pearly, nares patent, no discharge or erythema, pharynx normal Oral cavity: MMM, no lesions Neck: supple, no lymphadenopathy, no thyromegaly, no masses Heart: RRR, normal S1, S2, no murmurs Lungs: CTA bilaterally, no wheezes, rhonchi, or rales Abdomen: +bs, soft, + epigastric tenderness, non distended, no rebound, no masses, no hepatomegaly, no splenomegaly Musculoskeletal: nontender, no swelling, no obvious deformity  Extremities: no edema, no cyanosis, no clubbing Skin: Seb keratosis over back and AB- unchanged Pulses: 2+ symmetric, upper and lower extremities, normal cap refill Neurological: alert, oriented x 3, CN2-12 intact, strength normal upper extremities and lower extremities, sensation normal throughout, DTRs 2+ throughout, no cerebellar signs, gait normal Psychiatric: normal affect, behavior normal, pleasant  Gyn: defer  Rectal: defer   Vicie Mutters, PA-C   01/13/2019

## 2019-01-13 ENCOUNTER — Other Ambulatory Visit: Payer: Self-pay

## 2019-01-13 ENCOUNTER — Ambulatory Visit (INDEPENDENT_AMBULATORY_CARE_PROVIDER_SITE_OTHER): Payer: PPO | Admitting: Physician Assistant

## 2019-01-13 ENCOUNTER — Encounter: Payer: Self-pay | Admitting: Physician Assistant

## 2019-01-13 VITALS — BP 124/78 | HR 81 | Temp 97.7°F | Ht 63.5 in | Wt 145.2 lb

## 2019-01-13 DIAGNOSIS — Z136 Encounter for screening for cardiovascular disorders: Secondary | ICD-10-CM | POA: Diagnosis not present

## 2019-01-13 DIAGNOSIS — H409 Unspecified glaucoma: Secondary | ICD-10-CM

## 2019-01-13 DIAGNOSIS — D126 Benign neoplasm of colon, unspecified: Secondary | ICD-10-CM

## 2019-01-13 DIAGNOSIS — K219 Gastro-esophageal reflux disease without esophagitis: Secondary | ICD-10-CM

## 2019-01-13 DIAGNOSIS — Z79899 Other long term (current) drug therapy: Secondary | ICD-10-CM | POA: Diagnosis not present

## 2019-01-13 DIAGNOSIS — E559 Vitamin D deficiency, unspecified: Secondary | ICD-10-CM | POA: Diagnosis not present

## 2019-01-13 DIAGNOSIS — R7309 Other abnormal glucose: Secondary | ICD-10-CM | POA: Diagnosis not present

## 2019-01-13 DIAGNOSIS — I10 Essential (primary) hypertension: Secondary | ICD-10-CM

## 2019-01-13 DIAGNOSIS — Z0000 Encounter for general adult medical examination without abnormal findings: Secondary | ICD-10-CM

## 2019-01-13 DIAGNOSIS — Z0001 Encounter for general adult medical examination with abnormal findings: Secondary | ICD-10-CM

## 2019-01-13 DIAGNOSIS — E785 Hyperlipidemia, unspecified: Secondary | ICD-10-CM

## 2019-01-13 DIAGNOSIS — R6889 Other general symptoms and signs: Secondary | ICD-10-CM

## 2019-01-13 DIAGNOSIS — D649 Anemia, unspecified: Secondary | ICD-10-CM

## 2019-01-13 DIAGNOSIS — R0989 Other specified symptoms and signs involving the circulatory and respiratory systems: Secondary | ICD-10-CM

## 2019-01-13 MED ORDER — FAMOTIDINE 40 MG PO TABS: 40.0000 mg | ORAL_TABLET | Freq: Every evening | ORAL | 1 refills | Status: DC

## 2019-01-13 NOTE — Patient Instructions (Addendum)
COLOGUARD INFORMATION   Cologuard is an easy to use noninvasive colon cancer screening test based on the latest advances in stool DNA science.   Colon cancer is 3rd most diagnosed cancer and 2nd leading cause of death in both men and women 78 years of age and older despite being one of the most preventable and treatable cancers if found early.  4 of out 5 people diagnosed with colon cancer have NO prior family history.  When caught EARLY 90% of colon cancer is curable.   More than 92% of cologuard patients have NO out of pocket cost for screening however only your insurer can confirm how Cologuard would be covered for you. Cologuard has a team of specialist that can help you contact your insurer and ask the right questions. Please call [phone] so they can help.   You will receive a short call from [place] support center at [employer], when you receive a call they will say they are from [place],  to confirm your mailing address and give you more information.  When they calll you, it will appear on the caller ID as "[facility]" or in some cases only this number will appear, [phone].   [facility] [employer] will ship your collection kit directly to you. You will collect a single stool sample in the privacy of your own home, no special preparation required. You will return the kit via [facility] pre-paid shipping or pick-up, in the same box it arrived in. Then I will contact you to discuss your results after I receive them from the laboratory.   If you have any questions or concerns, [facility] Customer Support Specialist are available 24 hours a day, 7 days a week at [phone] or go to [email or web address].    Generally a cough is either coming from above or from below- so we will treat this OR it can be from irritation/viral cough  To treat the nasal drip: Get on the chlorphenirmine every 6 hours- This medication can make you sleepy but helps  with nasal drip- get from over the counter.   Can do a steroid nasal spary 1-2 sparys at night each nostril. Remember to spray each nostril twice towards the outer part of your eye.  Do not sniff but instead pinch your nose and tilt your head back to help the medicine get into your sinuses.  The best time to do this is at bedtime. Stop if you get blurred vision or nose bleeds.   To treat the reflux Will send in prilosec 40 mg to take once in the morning and take prevacid from over the counter at night for 2 weeks- then stop the prilosec and continue the pravacid or famotadine  To stop irritation: Need to STOP the cough Do sugar free candy Do the tessalon drops VOICE REST is VERY important  If not better in 2 weeks will refer to ENT  Go to the ER or call the office if you get any chest pain, shortness of breath, severe  headache, leg swelling.    Common causes of cough OR hoarseness OR throat clearing:   Allergies, Viral Infections, Acid Reflux and Bacterial Infections.    Allergies and viral infections cause a cough OR sore throat by post nasal drip and are often worse at night, can also have sneezing, lower grade fevers, clear/yellow mucus. This is best treated with allergy medications or nasal sprays.  Please get on allegra for 1-2 weeks The strongest is allegra or fexafinadine  Cheapest at [employer], [employer], [employer]   Bacterial infections are more severe than allergies or viral infections with fever, teeth pain, fatigue. This can be treated with prednisone and the same over the counter medication and after 7 days can be treated with an antibiotic.   Silent reflux/GERD can cause a cough OR sore throat OR hoarseness WITHOUT heart burn because the esophagus that goes to the stomach and trachea that goes to the lungs are very close and when you lay down the acid can irritate your throat and lungs. This can cause hoarseness, cough, and wheezing. Please stop any alcohol or anti-inflammatories  like aleve/advil/ibuprofen and start an over the counter Do pepcid 1-2 x a day for at least 2 weeks    sometimes irritation causes more irritation. Try voice rest, use sugar free cough drops to prevent coughing, and try to stop clearing your throat.   If you ever have a cough that does not go away after trying these things please make a follow up visit for further evaluation or we can refer you to a specialist. Or if you ever have shortness of breath or chest pain go to the ER.    Silent reflux: Not all heartburn burns...Marland KitchenMarland KitchenMarland Kitchen  What is LPR? Laryngopharyngeal reflux (LPR) or silent reflux is a condition in which acid that is made in the stomach travels up the esophagus (swallowing tube) and gets to the throat. Not everyone with reflux has a lot of heartburn or indigestion. In fact, many people with LPR never have heartburn. This is why LPR is called SILENT REFLUX, and the terms "Silent reflux" and "LPR" are often used interchangeably. Because LPR is silent, it is sometimes difficult to diagnose.  How can you tell if you have LPR?  Marland Kitchen Chronic hoarseness- Some people have hoarseness that comes and goes . throat clearing  . Cough . It can cause shortness of breath and cause asthma like symptoms. Marland Kitchen a feeling of a lump in the throat  . difficulty swallowing . a problem with too much nose and throat drainage.  . Some people will feel their esophagus spasm which feels like their heart beating hard and fast, this will usually be after a meal, at rest, or lying down at night.    How do I treat this? Treatment for LPR should be individualized, and your doctor will suggest the best treatment for you. Generally there are several treatments for LPR: . changing habits and diet to reduce reflux,  . medications to reduce stomach acid, and  . surgery to prevent reflux. Most people with LPR need to modify how and when they eat, as well as take some medication, to get well. Sometimes, nonprescription liquid  antacids, such as Maalox, Gelucil and Mylanta are recommended. When used, these antacids should be taken four times each day - one tablespoon one hour after each meal and before bedtime. Dietary and lifestyle changes alone are not often enough to control LPR - medications that reduce stomach acid are also usually needed. These must be prescribed by our doctor.   TIPS FOR REDUCING REFLUX AND LPR Control your LIFE-STYLE and your DIET! Marland Kitchen If you use tobacco, QUIT.  Marland Kitchen Smoking makes you reflux. After every cigarette you have some LPR.  . Don't wear clothing that is too tight, especially around the waist (trousers, corsets, belts).  . Do not lie down just after eating...in fact, do not eat within three hours of bedtime.  . You should be on a low-fat diet.  Marland Kitchen  Limit your intake of red meat.  . Limit your intake of butter.  Marland Kitchen Avoid fried foods.  . Avoid chocolate  . Avoid cheese.  Marland Kitchen Avoid eggs. Marland Kitchen Specifically avoid caffeine (especially coffee and tea), soda pop (especially cola) and mints.  . Avoid alcoholic beverages, particularly in the evening.    Marland KitchenMAXIMUM AMOUNT OF TYLENOL IN A DAY  You can take tylenol (543m) or tylenol arthritis (6541m with the meloxicam/antiinflammatories. The max you can take of tylenol a day is 300088maily, this is a max of 6 pills a day of the regular tyelnol (500m78mr a max of 4 a day of the tylenol arthritis (650mg11m long as no other medications you are taking contain tylenol.   Go to the ER if you have any new weakness in your arms, trouble with your grip, worse headache ever, fever, chills. or have worsening pain.   If you are not better in 1-3 month we will refer you to ortho   Cervical Sprain A cervical sprain is a stretch or tear in one or more of the tough, cord-like tissues that connect bones (ligaments) in the neck. Cervical sprains can range from mild to severe. Severe cervical sprains can cause the spinal bones (vertebrae) in the neck to be  unstable. This can lead to spinal cord damage and can result in serious nervous system problems. The amount of time that it takes for a cervical sprain to get better depends on the cause and extent of the injury. Most cervical sprains heal in 4-6 weeks. What are the causes? Cervical sprains may be caused by an injury (trauma), such as from a motor vehicle accident, a fall, or sudden forward and backward whipping movement of the head and neck (whiplash injury). Mild cervical sprains may be caused by wear and tear over time, such as from poor posture, sitting in a chair that does not provide support, or looking up or down for long periods of time. What increases the risk? The following factors may make you more likely to develop this condition:  Participating in activities that have a high risk of trauma to the neck. These include contact sports, auto racing, gymnastics, and diving.  Taking risks when driving or riding in a motor vehicle, such as speeding.  Having osteoarthritis of the spine.  Having poor strength and flexibility of the neck.  A previous neck injury.  Having poor posture.  Spending a lot of time in certain positions that put stress on the neck, such as sitting at a computer for long periods of time.  What are the signs or symptoms? Symptoms of this condition include:  Pain, soreness, stiffness, tenderness, swelling, or a burning sensation in the front, back, or sides of the neck.  Sudden tightening of neck muscles that you cannot control (muscle spasms).  Pain in the shoulders or upper back.  Limited ability to move the neck.  Headache.  Dizziness.  Nausea.  Vomiting.  Weakness, numbness, or tingling in a hand or an arm.  Symptoms may develop right away after injury, or they may develop over a few days. In some cases, symptoms may go away with treatment and return (recur) over time. How is this diagnosed? This condition may be diagnosed based on:  Your  medical history.  Your symptoms.  Any recent injuries or known neck problems that you have, such as arthritis in the neck.  A physical exam.  Imaging tests, such as: ? X-rays. ? MRI. ? CT scan.  How is this treated? This condition is treated by resting and icing the injured area and doing physical therapy exercises. Depending on the severity of your condition, treatment may also include:  Keeping your neck in place (immobilized) for periods of time. This may be done using: ? A cervical collar. This supports your chin and the back of your head. ? A cervical traction device. This is a sling that holds up your head. This removes weight and pressure from your neck, and it may help to relieve pain.  Medicines that help to relieve pain and inflammation.  Medicines that help to relax your muscles (muscle relaxants).  Surgery. This is rare.  Follow these instructions at home: If you have a cervical collar:  Wear it as told by your health care provider. Do not remove the collar unless instructed by your health care provider.  Ask your health care provider before you make any adjustments to your collar.  If you have long hair, keep it outside of the collar.  Ask your health care provider if you can remove the collar for cleaning and bathing. If you are allowed to remove the collar for cleaning or bathing: ? Follow instructions from your health care provider about how to remove the collar safely. ? Clean the collar by wiping it with mild soap and water and drying it completely. ? If your collar has removable pads, remove them every 1-2 days and wash them by hand with soap and water. Let them air-dry completely before you put them back in the collar. ? Check your skin under the collar for irritation or sores. If you see any, tell your health care provider. Managing pain, stiffness, and swelling  If directed, use a cervical traction device as told by your health care provider.  If  directed, apply heat to the affected area before you do your physical therapy or as often as told by your health care provider. Use the heat source that your health care provider recommends, such as a moist heat pack or a heating pad. ? Place a towel between your skin and the heat source. ? Leave the heat on for 20-30 minutes. ? Remove the heat if your skin turns bright red. This is especially important if you are unable to feel pain, heat, or cold. You may have a greater risk of getting burned.  If directed, put ice on the affected area: ? Put ice in a plastic bag. ? Place a towel between your skin and the bag. ? Leave the ice on for 20 minutes, 2-3 times a day. Activity  Do not drive while wearing a cervical collar. If you do not have a cervical collar, ask your health care provider if it is safe to drive while your neck heals.  Do not drive or use heavy machinery while taking prescription pain medicine or muscle relaxants, unless your health care provider approves.  Do not lift anything that is heavier than 10 lb (4.5 kg) until your health care provider tells you that it is safe.  Rest as directed by your health care provider. Avoid positions and activities that make your symptoms worse. Ask your health care provider what activities are safe for you.  If physical therapy was prescribed, do exercises as told by your health care provider or physical therapist. General instructions  Take over-the-counter and prescription medicines only as told by your health care provider.  Do not use any products that contain nicotine or tobacco, such as cigarettes and e-cigarettes.  These can delay healing. If you need help quitting, ask your health care provider.  Keep all follow-up visits as told by your health care provider or physical therapist. This is important. How is this prevented? To prevent a cervical sprain from happening again:  Use and maintain good posture. Make any needed adjustments to  your workstation to help you use good posture.  Exercise regularly as directed by your health care provider or physical therapist.  Avoid risky activities that may cause a cervical sprain.  Contact a health care provider if:  You have symptoms that get worse or do not get better after 2 weeks of treatment.  You have pain that gets worse or does not get better with medicine.  You develop new, unexplained symptoms.  You have sores or irritated skin on your neck from wearing your cervical collar. Get help right away if:  You have severe pain.  You develop numbness, tingling, or weakness in any part of your body.  You cannot move a part of your body (you have paralysis).  You have neck pain along with: ? Severe dizziness. ? Headache. Summary  A cervical sprain is a stretch or tear in one or more of the tough, cord-like tissues that connect bones (ligaments) in the neck.  Cervical sprains may be caused by an injury (trauma), such as from a motor vehicle accident, a fall, or sudden forward and backward whipping movement of the head and neck (whiplash injury).  Symptoms may develop right away after injury, or they may develop over a few days.  This condition is treated by resting and icing the injured area and doing physical therapy exercises. This information is not intended to replace advice given to you by your health care provider. Make sure you discuss any questions you have with your health care provider. Document Released: 10/28/2006 Document Revised: 08/30/2015 Document Reviewed: 08/30/2015 Elsevier Interactive Patient Education  2017 Else[employer] Inc.   Cervical Strain and Sprain Rehab Ask your health care provider which exercises are safe for you. Do exercises exactly as told by your health care provider and adjust them as directed. It is normal to feel mild stretching, pulling, tightness, or discomfort as you do these exercises, but you should stop right away if you feel  sudden pain or your pain gets worse.Do not begin these exercises until told by your health care provider. Stretching and range of motion exercises These exercises warm up your muscles and joints and improve the movement and flexibility of your neck. These exercises also help to relieve pain, numbness, and tingling. Exercise A: Cervical side bend  1. Using good posture, sit on a stable chair or stand up. 2. Without moving your shoulders, slowly tilt your left / right ear to your shoulder until you feel a stretch in your neck muscles. You should be looking straight ahead. 3. Hold for __________ seconds. 4. Repeat with the other side of your neck. Repeat __________ times. Complete this exercise __________ times a day. Exercise B: Cervical rotation  1. Using good posture, sit on a stable chair or stand up. 2. Slowly turn your head to the side as if you are looking over your left / right shoulder. ? Keep your eyes level with the ground. ? Stop when you feel a stretch along the side and the back of your neck. 3. Hold for __________ seconds. 4. Repeat this by turning to your other side. Repeat __________ times. Complete this exercise __________ times a day. Exercise C: Thoracic  extension and pectoral stretch 1. Roll a towel or a small blanket so it is about 4 inches (10 cm) in diameter. 2. Lie down on your back on a firm surface. 3. Put the towel lengthwise, under your spine in the middle of your back. It should not be not under your shoulder blades. The towel should line up with your spine from your middle back to your lower back. 4. Put your hands behind your head and let your elbows fall out to your sides. 5. Hold for __________ seconds. Repeat __________ times. Complete this exercise __________ times a day. Strengthening exercises These exercises build strength and endurance in your neck. Endurance is the ability to use your muscles for a long time, even after your muscles get  tired. Exercise D: Upper cervical flexion, isometric 1. Lie on your back with a thin pillow behind your head and a small rolled-up towel under your neck. 2. Gently tuck your chin toward your chest and nod your head down to look toward your feet. Do not lift your head off the pillow. 3. Hold for __________ seconds. 4. Release the tension slowly. Relax your neck muscles completely before you repeat this exercise. Repeat __________ times. Complete this exercise __________ times a day. Exercise E: Cervical extension, isometric  1. Stand about 6 inches (15 cm) away from a wall, with your back facing the wall. 2. Place a soft object, about 6-8 inches (15-20 cm) in diameter, between the back of your head and the wall. A soft object could be a small pillow, a ball, or a folded towel. 3. Gently tilt your head back and press into the soft object. Keep your jaw and forehead relaxed. 4. Hold for __________ seconds. 5. Release the tension slowly. Relax your neck muscles completely before you repeat this exercise. Repeat __________ times. Complete this exercise __________ times a day. Posture and body mechanics  Body mechanics refers to the movements and positions of your body while you do your daily activities. Posture is part of body mechanics. Good posture and healthy body mechanics can help to relieve stress in your body's tissues and joints. Good posture means that your spine is in its natural S-curve position (your spine is neutral), your shoulders are pulled back slightly, and your head is not tipped forward. The following are general guidelines for applying improved posture and body mechanics to your everyday activities. Standing  When standing, keep your spine neutral and keep your feet about hip-width apart. Keep a slight bend in your knees. Your ears, shoulders, and hips should line up.  When you do a task in which you stand in one place for a long time, place one foot up on a stable object that  is 2-4 inches (5-10 cm) high, such as a footstool. This helps keep your spine neutral. Sitting   When sitting, keep your spine neutral and your keep feet flat on the floor. Use a footrest, if necessary, and keep your thighs parallel to the floor. Avoid rounding your shoulders, and avoid tilting your head forward.  When working at a desk or a computer, keep your desk at a height where your hands are slightly lower than your elbows. Slide your chair under your desk so you are close enough to maintain good posture.  When working at a computer, place your monitor at a height where you are looking straight ahead and you do not have to tilt your head forward or downward to look at the screen. Resting When lying  down and resting, avoid positions that are most painful for you. Try to support your neck in a neutral position. You can use a contour pillow or a small rolled-up towel. Your pillow should support your neck but not push on it. This information is not intended to replace advice given to you by your health care provider. Make sure you discuss any questions you have with your health care provider. Document Released: 12/31/2004 Document Revised: 09/07/2015 Document Reviewed: 12/07/2014 Elsevier Interactive Patient Education  2018 [place] insurance and pharmacy about shingrix - new vaccine   Can go to [email or web address] for more information  Shingrix Vaccination  Two vaccines are licensed and recommended to prevent shingles in the [place] Zoster vaccine live (ZVL, Zostavax) has been in use since 2006. Recombinant zoster vaccine (RZV, Shingrix), has been in use since 2017 and is recommended by ACIP as the preferred shingles vaccine.  What Everyone Should Know about Shingles Vaccine (Shingrix) One of the Recommended Vaccines by Disease Shingles vaccination is the only way to protect against shingles and postherpetic neuralgia (PHN),  the most common complication from shingles. CDC recommends that healthy adults 50 years and older get two doses of the shingles vaccine called Shingrix (recombinant zoster vaccine), separated by 2 to 6 months, to prevent shingles and the complications from the disease. Your doctor or pharmacist can give you Shingrix as a shot in your upper arm. Shingrix provides strong protection against shingles and PHN. Two doses of Shingrix is more than 90% effective at preventing shingles and PHN. Protection stays above 85% for at least the first four years after you get vaccinated. Shingrix is the preferred vaccine, over Zostavax (zoster vaccine live), a shingles vaccine in use since 2006. Zostavax may still be used to prevent shingles in healthy adults 60 years and older. For example, you could use Zostavax if a person is allergic to Shingrix, prefers Zostavax, or requests immediate vaccination and Shingrix is unavailable. Who Should Get Shingrix? Healthy adults 50 years and older should get two doses of Shingrix, separated by 2 to 6 months. You should get Shingrix even if in the past you . had shingles  . received Zostavax  . are not sure if you had chickenpox There is no maximum age for getting Shingrix. If you had shingles in the past, you can get Shingrix to help prevent future occurrences of the disease. There is no specific length of time that you need to wait after having shingles before you can receive Shingrix, but generally you should make sure the shingles rash has gone away before getting vaccinated. You can get Shingrix whether or not you remember having had chickenpox in the past. Studies show that more than 99% of Americans 40 years and older have had chickenpox, even if they don't remember having the disease. Chickenpox and shingles are related because they are caused by the same virus (varicella zoster virus). After a person recovers from chickenpox, the virus stays dormant (inactive) in the body. It  can reactivate years later and cause shingles. If you had Zostavax in the recent past, you should wait at least eight weeks before getting Shingrix. Talk to your healthcare provider to determine the best time to get Shingrix. Shingrix is available in [facility] and pharmacies. To find doctor's offices or pharmacies near you that offer the vaccine, visit [employer] Vaccine FinderExternal. If you have questions about Shingrix, talk with your healthcare provider. Vaccine for Those 50 Years and Older  Shingrix reduces  the risk of shingles and PHN by more than 90% in people 50 and older. CDC recommends the vaccine for healthy adults 46 and older.  Who Should Not Get Shingrix? You should not get Shingrix if you: . have ever had a severe allergic reaction to any component of the vaccine or after a dose of Shingrix  . tested negative for immunity to varicella zoster virus. If you test negative, you should get chickenpox vaccine.  . currently have shingles  . currently are pregnant or breastfeeding. Women who are pregnant or breastfeeding should wait to get Shingrix.  Marland Kitchen receive specific antiviral drugs (acyclovir, famciclovir, or valacyclovir) 24 hours before vaccination (avoid use of these antiviral drugs for 14 days after vaccination)- zoster vaccine live only If you have a minor acute (starts suddenly) illness, such as a cold, you may get Shingrix. But if you have a moderate or severe acute illness, you should usually wait until you recover before getting the vaccine. This includes anyone with a temperature of 101.33F or higher. The side effects of the Shingrix are temporary, and usually last 2 to 3 days. While you may experience pain for a few days after getting Shingrix, the pain will be less severe than having shingles and the complications from the disease. How Well Does Shingrix Work? Two doses of Shingrix provides strong protection against shingles and postherpetic neuralgia (PHN), the most  common complication of shingles. . In adults 17 to 78 years old who got two doses, Shingrix was 97% effective in preventing shingles; among adults 70 years and older, Shingrix was 91% effective.  . In adults 42 to 78 years old who got two doses, Shingrix was 91% effective in preventing PHN; among adults 70 years and older, Shingrix was 89% effective. Shingrix protection remained high (more than 85%) in people 70 years and older throughout the four years following vaccination. Since your risk of shingles and PHN increases as you get older, it is important to have strong protection against shingles in your older years. Top of Page  What Are the Possible Side Effects of Shingrix? Studies show that Shingrix is safe. The vaccine helps your body create a strong defense against shingles. As a result, you are likely to have temporary side effects from getting the shots. The side effects may affect your ability to do normal daily activities for 2 to 3 days. Most people got a sore arm with mild or moderate pain after getting Shingrix, and some also had redness and swelling where they got the shot. Some people felt tired, had muscle pain, a headache, shivering, fever, stomach pain, or nausea. About 1 out of 6 people who got Shingrix experienced side effects that prevented them from doing regular activities. Symptoms went away on their own in about 2 to 3 days. Side effects were more common in younger people. You might have a reaction to the first or second dose of Shingrix, or both doses. If you experience side effects, you may choose to take over-the-counter pain medicine such as ibuprofen or acetaminophen. If you experience side effects from Shingrix, you should report them to the Vaccine Adverse Event Reporting System (VAERS). Your doctor might file this report, or you can do it yourself through the VAERS websiteExternal, or by calling [phone]. If you have any questions about side effects from Shingrix,  talk with your doctor. The shingles vaccine does not contain thimerosal (a preservative containing mercury). Top of Page  When Should I See a Doctor Because of the  Side Effects I Experience From Shingrix? In clinical trials, Shingrix was not associated with serious adverse events. In fact, serious side effects from vaccines are extremely rare. For example, for every 1 million doses of a vaccine given, only one or two people may have a severe allergic reaction. Signs of an allergic reaction happen within minutes or hours after vaccination and include hives, swelling of the face and throat, difficulty breathing, a fast heartbeat, dizziness, or weakness. If you experience these or any other life-threatening symptoms, see a doctor right away. Shingrix causes a strong response in your immune system, so it may produce short-term side effects more intense than you are used to from other vaccines. These side effects can be uncomfortable, but they are expected and usually go away on their own in 2 or 3 days. Top of Page  How Can I Pay For Shingrix? There are several ways shingles vaccine may be paid for: Medicare . Medicare Part D plans cover the shingles vaccine, but there may be a cost to you depending on your plan. There may be a copay for the vaccine, or you may need to pay in full then get reimbursed for a certain amount.  . Medicare Part B does not cover the shingles vaccine. Medicaid . Medicaid may or may not cover the vaccine. Contact your insurer to find out. Private health insurance . Many private health insurance plans will cover the vaccine, but there may be a cost to you depending on your plan. Contact your insurer to find out. Vaccine assistance programs . Some pharmaceutical companies provide vaccines to eligible adults who cannot afford them. You may want to check with the vaccine manufacturer, [employer], about Shingrix. If you do not currently have health insurance, learn more about  affordable health coverage optionsExternal. To find doctor's offices or pharmacies near you that offer the vaccine, visit [facility] Vaccine FinderExternal.

## 2019-01-14 LAB — CBC WITH DIFFERENTIAL/PLATELET
Absolute Monocytes: 192 cells/uL — ABNORMAL LOW (ref 200–950)
Basophils Absolute: 19 cells/uL (ref 0–200)
Basophils Relative: 0.6 %
Eosinophils Absolute: 70 cells/uL (ref 15–500)
Eosinophils Relative: 2.2 %
HCT: 35.2 % (ref 35.0–45.0)
Hemoglobin: 11.6 g/dL — ABNORMAL LOW (ref 11.7–15.5)
Lymphs Abs: 1114 cells/uL (ref 850–3900)
MCH: 29.7 pg (ref 27.0–33.0)
MCHC: 33 g/dL (ref 32.0–36.0)
MCV: 90 fL (ref 80.0–100.0)
MPV: 9.9 fL (ref 7.5–12.5)
Monocytes Relative: 6 %
Neutro Abs: 1805 cells/uL (ref 1500–7800)
Neutrophils Relative %: 56.4 %
Platelets: 264 10*3/uL (ref 140–400)
RBC: 3.91 10*6/uL (ref 3.80–5.10)
RDW: 12.1 % (ref 11.0–15.0)
Total Lymphocyte: 34.8 %
WBC: 3.2 10*3/uL — ABNORMAL LOW (ref 3.8–10.8)

## 2019-01-14 LAB — MICROALBUMIN / CREATININE URINE RATIO
Creatinine, Urine: 58 mg/dL (ref 20–275)
Microalb Creat Ratio: 3 mcg/mg creat (ref <30)
Microalb, Ur: 0.2 mg/dL

## 2019-01-14 LAB — TSH: TSH: 0.79 mIU/L (ref 0.40–4.50)

## 2019-01-14 LAB — URINALYSIS, ROUTINE W REFLEX MICROSCOPIC
Bilirubin Urine: NEGATIVE
Glucose, UA: NEGATIVE
Hgb urine dipstick: NEGATIVE
Ketones, ur: NEGATIVE
Leukocytes,Ua: NEGATIVE
Nitrite: NEGATIVE
Protein, ur: NEGATIVE
Specific Gravity, Urine: 1.008 (ref 1.001–1.03)
pH: 7 (ref 5.0–8.0)

## 2019-01-14 LAB — COMPLETE METABOLIC PANEL WITH GFR
AG Ratio: 1.5 (calc) (ref 1.0–2.5)
ALT: 9 U/L (ref 6–29)
AST: 15 U/L (ref 10–35)
Albumin: 4.4 g/dL (ref 3.6–5.1)
Alkaline phosphatase (APISO): 62 U/L (ref 37–153)
BUN: 12 mg/dL (ref 7–25)
CO2: 34 mmol/L — ABNORMAL HIGH (ref 20–32)
Calcium: 10.2 mg/dL (ref 8.6–10.4)
Chloride: 102 mmol/L (ref 98–110)
Creat: 0.91 mg/dL (ref 0.60–0.93)
GFR, Est African American: 70 mL/min/{1.73_m2} (ref >=60)
GFR, Est Non African American: 60 mL/min/{1.73_m2} (ref >=60)
Globulin: 2.9 g/dL (calc) (ref 1.9–3.7)
Glucose, Bld: 86 mg/dL (ref 65–99)
Potassium: 4.2 mmol/L (ref 3.5–5.3)
Sodium: 142 mmol/L (ref 135–146)
Total Bilirubin: 0.5 mg/dL (ref 0.2–1.2)
Total Protein: 7.3 g/dL (ref 6.1–8.1)

## 2019-01-14 LAB — MAGNESIUM: Magnesium: 2.2 mg/dL (ref 1.5–2.5)

## 2019-01-14 LAB — LIPID PANEL
Cholesterol: 248 mg/dL — ABNORMAL HIGH (ref <200)
HDL: 89 mg/dL (ref >=50)
LDL Cholesterol (Calc): 144 mg/dL (calc) — ABNORMAL HIGH
Non-HDL Cholesterol (Calc): 159 mg/dL (calc) — ABNORMAL HIGH (ref <130)
Total CHOL/HDL Ratio: 2.8 (calc) (ref <5.0)
Triglycerides: 60 mg/dL (ref <150)

## 2019-01-14 LAB — VITAMIN D 25 HYDROXY (VIT D DEFICIENCY, FRACTURES): Vit D, 25-Hydroxy: 41 ng/mL (ref 30–100)

## 2019-01-14 LAB — HEMOGLOBIN A1C
Hgb A1c MFr Bld: 5.5 % of total Hgb (ref <5.7)
Mean Plasma Glucose: 111 (calc)
eAG (mmol/L): 6.2 (calc)

## 2019-01-20 DIAGNOSIS — H40003 Preglaucoma, unspecified, bilateral: Secondary | ICD-10-CM | POA: Diagnosis not present

## 2019-07-08 NOTE — Progress Notes (Signed)
   Subjective:    Patient ID: Robin Zimmerman, female    DOB: 1940-12-16, 79 y.o.   MRN: [id number]  HPI 79 y.o. AAF presents with right ear pain and 6 month follow up.  States allergies have been bad this year.  She will be going to [place] next week to visit family.   About a week ago she started to have right ear pain. She has had this happen before, had ear cleaned out. She tried putting peroxide in it and using ear drops.    No fever, no chills, no SOB, no CP.   Her blood pressure has been controlled at home, today their BP is BP: 122/64  She does not workout. She denies chest pain, shortness of breath, dizziness.  She is not on cholesterol medication and denies myalgias. Her cholesterol is not at goal. The cholesterol last visit was:   Lab Results  Component Value Date   CHOL 248 (H) 01/13/2019   HDL 89 01/13/2019   LDLCALC 144 (H) 01/13/2019   TRIG 60 01/13/2019   CHOLHDL 2.8 01/13/2019   Last A1C in the office was:  Lab Results  Component Value Date   HGBA1C 5.5 01/13/2019   Patient is on Vitamin D supplement.   Lab Results  Component Value Date   VD25OH 41 01/13/2019      Blood pressure 122/64, pulse 82, temperature (!) 97.5 F (36.4 C), weight 144 lb 9.6 oz (65.6 kg), SpO2 97 %.  Medications      Current Outpatient Medications (Hematological):  Marland Kitchen  IRON PO, Take by mouth daily.  Current Outpatient Medications (Other):  Marland Kitchen  CALCIUM PO, Take by mouth daily. .  Cholecalciferol (VITAMIN D PO), Take 5,000 Int'l Units by mouth daily. .  famotidine (PEPCID) 40 MG tablet, Take 1 tablet (40 mg total) by mouth every evening. .  hydrocortisone cream 1 %, Apply to affected area 2 times daily .  latanoprost (XALATAN) 0.005 % ophthalmic solution,   Problem list She has GERD (gastroesophageal reflux disease); Hyperlipidemia; Hypertension; Other abnormal glucose; Vitamin D deficiency; Medication management; Benign neoplasm of colon; Glaucoma; Absolute anemia;  Encounter for Medicare annual wellness exam; Posterior tibial tendinitis of right lower extremity; and Pes planus on their problem list.   Review of Systems     Objective:   Physical Exam        Assessment & Plan:  Redith was seen today for ear fullness.  Diagnoses and all orders for this visit:  Right ear pain Right ear impacted cerumen - stop using Qtips, irrigation used in the office without complications, use OTC drops/oil at home to prevent reoccurence  Hyperlipidemia, unspecified hyperlipidemia type -     Lipid panel check lipids decrease fatty foods increase activity.   Essential hypertension -     CBC with Differential/Platelet -     COMPLETE METABOLIC PANEL WITH GFR -     TSH - continue medications, DASH diet, exercise and monitor at home. Call if greater than 130/80.    Future Appointments  Date Time Provider [place]  01/13/2020 10:00 AM Vicie Mutters, PA-C [facility] None

## 2019-07-09 ENCOUNTER — Encounter: Payer: Self-pay | Admitting: Physician Assistant

## 2019-07-09 ENCOUNTER — Other Ambulatory Visit: Payer: Self-pay

## 2019-07-09 ENCOUNTER — Ambulatory Visit (INDEPENDENT_AMBULATORY_CARE_PROVIDER_SITE_OTHER): Payer: PPO | Admitting: Physician Assistant

## 2019-07-09 VITALS — BP 122/64 | HR 82 | Temp 97.5°F | Wt 144.6 lb

## 2019-07-09 DIAGNOSIS — H6121 Impacted cerumen, right ear: Secondary | ICD-10-CM

## 2019-07-09 DIAGNOSIS — I10 Essential (primary) hypertension: Secondary | ICD-10-CM

## 2019-07-09 DIAGNOSIS — E785 Hyperlipidemia, unspecified: Secondary | ICD-10-CM

## 2019-07-09 DIAGNOSIS — H9201 Otalgia, right ear: Secondary | ICD-10-CM

## 2019-07-09 NOTE — Patient Instructions (Addendum)
Use a dropper or use a cap to put peroxide, olive oil,mineral oil or canola oil in the effected ear- 2-3 times a week. Let it soak for 20-30 min then you can take a shower or use a baby bulb with warm water to wash out the ear wax.  Can buy debrox wax removal kit over the counter.  Do not use Qtips  Your ears and sinuses are connected by the eustachian tube. When your sinuses are inflamed, this can close off the tube and cause fluid to collect in your middle ear. This can then cause dizziness, popping, clicking, ringing, and echoing in your ears. This is often NOT an infection and does NOT require antibiotics, it is caused by inflammation so the treatments help the inflammation. This can take a long time to get better so please be patient.  Here are things you can do to help with this:  -While drinking fluids, pinch and hold nose close and swallow, to help open eustachian tubes to drain fluid behind ear drums.  -Please pick one of the over the counter allergy medications below and take it once daily for allergies.  It will also help with fluid behind ear drums. Claritin or loratadine cheapest but likely the weakest  Zyrtec or certizine at night because it can make you sleepy The strongest is allegra or fexafinadine  Cheapest at [employer], [employer], [employer]  -can use decongestant over the counter, please do not use if you have high blood pressure or certain heart conditions.   if worsening HA, changes vision/speech, imbalance, weakness go to the ER  Stop the calcium  Ways to prevent diarrhea with magnesium:  1) Don't take all your magnesium at the same time, have 2-3 smaller doses through out the day 2) Try taking your magnesium with high fiber meals.  3) If this does not help, take the magnesium on an empty stomach. Fiber for some people can bind the magnesium too well and prevent absorption in your gut.  4) Lastly try different types of magnesium. Most people are taking magnesium citrate, you  can also try dimalate capsules which are slow release. You can also find magnesium lotions/sprays for the skin that bypass the gut. Another one that has good absorption is ReMag (pico-iconic magnesium formula), this has great cellular absorption so less of a laxative effect. You can find these type at health food stores or online.     10 Tips on Belching, Bloating, and Flatulence 1. Belching is caused by swallowed air from:  Eating or drinking too fast  Poorly fitting dentures; not chewing food completely  Carbonated beverages  Chewing gum or sucking on hard candies  Excessive swallowing due to nervous tension or postnasal drip  Forced belching to relieve abdominal discomfort 2. To prevent excessive belching, avoid:  Carbonated beverages  Chewing gum  Hard candies  Simethicone/GasX may be helpful  3. Abdominal bloating and discomfort may be due to intestinal sensitivity or symptoms of irritable bowel syndrome. To relieve symptoms, avoid:  Broccoli  Baked beans  Cabbage  Carbonated drinks  Cauliflower  Chewing gum  Hard candy 4. Abdominal distention resulting from weak abdominal muscles:  Is better in the morning  Gets worse as the day progresses  Is relieved by lying down 5. To prevent Abdominal distention:  Tighten abdominal muscles by pulling in your stomach several times during the day  Do sit-up exercises if possible  Wear an abdominal support garment if exercise is too difficult 6. Flatulence is gas  created through bacterial action in the bowel and passed rectally. Keep in mind that:  10-18 passages per day are normal  Primary gases are harmless and odorless  Noticeable smells are trace gases related to food intake 7. Foods that are likely to form gas include:  Milk, dairy products, and medications that contain lactose--If your body doesn't produce the enzyme (lactase) to break it down.  Certain vegetables--baked beans, cauliflower, broccoli, cabbage  Certain  starches--wheat, oats, corn, potatoes. Rice is a good substitute. 8. Identify offending foods. Reduce or eliminate these gas-forming foods from your diet.

## 2019-07-10 LAB — LIPID PANEL
Cholesterol: 256 mg/dL — ABNORMAL HIGH (ref <200)
HDL: 94 mg/dL (ref >=50)
LDL Cholesterol (Calc): 146 mg/dL (calc) — ABNORMAL HIGH
Non-HDL Cholesterol (Calc): 162 mg/dL (calc) — ABNORMAL HIGH (ref <130)
Total CHOL/HDL Ratio: 2.7 (calc) (ref <5.0)
Triglycerides: 69 mg/dL (ref <150)

## 2019-07-10 LAB — CBC WITH DIFFERENTIAL/PLATELET
Absolute Monocytes: 207 cells/uL (ref 200–950)
Basophils Absolute: 20 cells/uL (ref 0–200)
Basophils Relative: 0.7 %
Eosinophils Absolute: 118 cells/uL (ref 15–500)
Eosinophils Relative: 4.2 %
HCT: 34.6 % — ABNORMAL LOW (ref 35.0–45.0)
Hemoglobin: 11.3 g/dL — ABNORMAL LOW (ref 11.7–15.5)
Lymphs Abs: 1184 cells/uL (ref 850–3900)
MCH: 30.2 pg (ref 27.0–33.0)
MCHC: 32.7 g/dL (ref 32.0–36.0)
MCV: 92.5 fL (ref 80.0–100.0)
MPV: 9.8 fL (ref 7.5–12.5)
Monocytes Relative: 7.4 %
Neutro Abs: 1271 cells/uL — ABNORMAL LOW (ref 1500–7800)
Neutrophils Relative %: 45.4 %
Platelets: 231 10*3/uL (ref 140–400)
RBC: 3.74 10*6/uL — ABNORMAL LOW (ref 3.80–5.10)
RDW: 11.9 % (ref 11.0–15.0)
Total Lymphocyte: 42.3 %
WBC: 2.8 10*3/uL — ABNORMAL LOW (ref 3.8–10.8)

## 2019-07-10 LAB — COMPLETE METABOLIC PANEL WITH GFR
AG Ratio: 1.5 (calc) (ref 1.0–2.5)
ALT: 9 U/L (ref 6–29)
AST: 16 U/L (ref 10–35)
Albumin: 4.1 g/dL (ref 3.6–5.1)
Alkaline phosphatase (APISO): 59 U/L (ref 37–153)
BUN/Creatinine Ratio: 11 (calc) (ref 6–22)
BUN: 12 mg/dL (ref 7–25)
CO2: 30 mmol/L (ref 20–32)
Calcium: 9.5 mg/dL (ref 8.6–10.4)
Chloride: 106 mmol/L (ref 98–110)
Creat: 1.05 mg/dL — ABNORMAL HIGH (ref 0.60–0.93)
GFR, Est African American: 59 mL/min/{1.73_m2} — ABNORMAL LOW (ref >=60)
GFR, Est Non African American: 51 mL/min/{1.73_m2} — ABNORMAL LOW (ref >=60)
Globulin: 2.8 g/dL (calc) (ref 1.9–3.7)
Glucose, Bld: 73 mg/dL (ref 65–99)
Potassium: 4 mmol/L (ref 3.5–5.3)
Sodium: 143 mmol/L (ref 135–146)
Total Bilirubin: 0.4 mg/dL (ref 0.2–1.2)
Total Protein: 6.9 g/dL (ref 6.1–8.1)

## 2019-07-10 LAB — TSH: TSH: 0.62 mIU/L (ref 0.40–4.50)

## 2019-10-08 ENCOUNTER — Other Ambulatory Visit: Payer: Self-pay | Admitting: Internal Medicine

## 2019-10-08 DIAGNOSIS — Z1231 Encounter for screening mammogram for malignant neoplasm of breast: Secondary | ICD-10-CM

## 2019-10-18 ENCOUNTER — Encounter: Payer: Self-pay | Admitting: Adult Health

## 2019-10-18 ENCOUNTER — Other Ambulatory Visit: Payer: Self-pay

## 2019-10-18 ENCOUNTER — Ambulatory Visit (INDEPENDENT_AMBULATORY_CARE_PROVIDER_SITE_OTHER): Payer: PPO | Admitting: Adult Health

## 2019-10-18 VITALS — BP 124/72 | HR 77 | Temp 97.5°F | Wt 148.0 lb

## 2019-10-18 DIAGNOSIS — R2681 Unsteadiness on feet: Secondary | ICD-10-CM

## 2019-10-18 DIAGNOSIS — R6889 Other general symptoms and signs: Secondary | ICD-10-CM | POA: Diagnosis not present

## 2019-10-18 DIAGNOSIS — J302 Other seasonal allergic rhinitis: Secondary | ICD-10-CM | POA: Diagnosis not present

## 2019-10-18 DIAGNOSIS — R42 Dizziness and giddiness: Secondary | ICD-10-CM

## 2019-10-18 DIAGNOSIS — K219 Gastro-esophageal reflux disease without esophagitis: Secondary | ICD-10-CM | POA: Diagnosis not present

## 2019-10-18 DIAGNOSIS — R0989 Other specified symptoms and signs involving the circulatory and respiratory systems: Secondary | ICD-10-CM

## 2019-10-18 MED ORDER — ASPIRIN EC 81 MG PO TBEC: 81.0000 mg | DELAYED_RELEASE_TABLET | Freq: Every day | ORAL | Status: DC

## 2019-10-18 MED ORDER — FAMOTIDINE 40 MG PO TABS: 40.0000 mg | ORAL_TABLET | Freq: Every evening | ORAL | 1 refills | Status: DC

## 2019-10-18 MED ORDER — MECLIZINE HCL 25 MG PO TABS: ORAL_TABLET | ORAL | 0 refills | Status: DC

## 2019-10-18 NOTE — Progress Notes (Signed)
Assessment and Plan:  Syleena was seen today for dizziness.  Diagnoses and all orders for this visit:  Dizziness Unsteady gait New onset  normal neuro excepting very unsteady heel to toe gait, does not have history of vertigo   - discussed with risk factors (former smoker, uncontrolled lipids, family hx of CVA) on exam LE strength appears symmetrical but unsteady gait is concerning, unclear etiology of dizziness small CVA is possible; CT head discussed to r/o; she declines today but would consider;  will check labs to r/o progressive anemia contributing, dehydration, mild infection,  orthostatics normal; did encourage increased fluid intake Will try allergy tx, PRN meclizine, add ASA Follow up in 3-7 days with progress If not improving will pursue head imaging  if worsening HA, changes vision/speech, imbalance, weakness go to the ER -     CBC with Differential/Platelet -     BASIC METABOLIC PANEL WITH GFR -     Orthostatic vital signs -     Urinalysis w microscopic + reflex cultue -     meclizine (ANTIVERT) 25 MG tablet; 1/2-1 pill up to 3 times daily for motion sickness/dizziness -     aspirin EC 81 MG tablet; Take 1 tablet (81 mg total) by mouth daily. Swallow whole.  Seasonal allergies - Allegra OTC, increase H20, add short term flonase bilaterally, allergy hygiene explained.  Further disposition pending results of labs. Discussed med's effects and SE's.   Over 30 minutes of exam, counseling, chart review, and critical decision making was performed.   Future Appointments  Date Time Provider [place]  10/29/2019  8:30 AM [facility]  01/13/2020 10:00 AM McClanahan, Kyra, NP [facility] None    ------------------------------------------------------------------------------------------------------------------   HPI BP 124/72   Pulse 77   Temp (!) 97.5 F (36.4 C)   Wt 148 lb (67.1 kg)   SpO2 97%   BMI 25.81 kg/m   79 y.o.female remote  smoker (quit 1975) with hx of htn, hld, anemia, seasonal allergies presents for evaluation of 3 days of episodic dizziness.   She reports woke up 3 days ago, felt dizzy, like she has a weight on her eyes, worse when she got up, "swimmy headed" - denies spinning or near syncope, but felt very unsteady on her feet. Worse with going from sitting to standing. Has had sitting still as well, but less. Denies with rolling over in bed, neck extension or rotation. Denies changes in hearing, pressure, tinnitis.   She also reports sense of weakness in R leg onset with similar timeframe, seems like it gives out. Did have lower back pain last week prior to this starting but has resolved, continues to report intermittent sense of weakness "like my leg gives out" but denies falls since then. Denies numbness, tingling.   She does also report 1 month of nasal congestion/sinus pressure intermittently, post nasal drip. She reports hx of fall allergies, but mild and doesn't take antihistamine. Has been gargling ACV with some perceived benefit.   She denies headache (other than sinus pressure, none today), blurry vision (other than baseline with glaucoma/cataract, unchanged), numbness/tingling, slurring.   She denies CP, dyspnea, wheezing, palpitations, edema. Not on BP medication. Last EKG 01/13/2019 reviewed, NSR, no heart block. She has remote smoking history, quit in 1976.  Her mother had CVA in 21s. She is not taking aspirin.   She does have chronic anemia at baseline; on iron supplement for many years; denies abdominal pain, fatigue, bloody/black stools CBC Latest Ref  Rng & Units 07/09/2019 01/13/2019 09/02/2018  WBC 3.8 - 10.8 Thousand/uL 2.8(L) 3.2(L) 3.3(L)  Hemoglobin 11.7 - 15.5 g/dL 11.3(L) 11.6(L) 10.7(L)  Hematocrit 35 - 45 % 34.6(L) 35.2 32.5(L)  Platelets 140 - 400 Thousand/uL 231 264 261   Lab Results  Component Value Date   IRON 76 07/20/2014   TIBC 273 07/20/2014   FERRITIN 164 07/20/2014       Today their BP is BP: 124/72   She is not on cholesterol medication and denies myalgias. Her cholesterol is not at goal. The cholesterol last visit was:   Lab Results  Component Value Date   CHOL 256 (H) 07/09/2019   HDL 94 07/09/2019   LDLCALC 146 (H) 07/09/2019   TRIG 69 07/09/2019   CHOLHDL 2.7 07/09/2019     Past Medical History:  Diagnosis Date  . Anemia   . GERD (gastroesophageal reflux disease)   . Glaucoma   . Hyperlipidemia   . Hypertension   . Thyroid disease      Allergies:  Allergies  Allergen Reactions  . Prednisone Other (See Comments)    Hallucinations   Family History:  Herfamily history includes Alzheimer's disease in her father; Heart disease in her mother. Social History:   reports that she quit smoking about 45 years ago. She has never used smokeless tobacco. She reports that she does not drink alcohol and does not use drugs.   Allergies  Allergen Reactions  . Prednisone Other (See Comments)    Hallucinations    Current Outpatient Medications on File Prior to Visit  Medication Sig  . Cholecalciferol (VITAMIN D PO) Take 5,000 Int'l Units by mouth daily.  . Cyanocobalamin (B-12) 500 MCG TABS Take by mouth daily.  . famotidine (PEPCID) 40 MG tablet Take 1 tablet (40 mg total) by mouth every evening.  . IRON PO Take by mouth daily.  Marland Kitchen latanoprost (XALATAN) 0.005 % ophthalmic solution   . Magnesium 250 MG TABS Take by mouth daily.   No current facility-administered medications on file prior to visit.    ROS: all negative except above.   Physical Exam:  BP 124/72   Pulse 77   Temp (!) 97.5 F (36.4 C)   Wt 148 lb (67.1 kg)   SpO2 97%   BMI 25.81 kg/m   General Appearance: Well nourished, well dressed AA elder female, in no apparent distress. Eyes: PERRLA, EOMs, conjunctiva no swelling or erythema Sinuses: No Frontal/maxillary tenderness ENT/Mouth: Ext aud canals clear, TMs without erythema, bulging. No erythema, swelling, or  exudate on post pharynx.  Tonsils not swollen or erythematous. Hearing normal.  Neck: Supple, thyroid normal.  Respiratory: Respiratory effort normal, BS equal bilaterally without rales, rhonchi, wheezing or stridor.  Cardio: RRR, mild 2/6 systolic murmur without radiation, no click, gallop. Brisk peripheral pulses without edema. No carotid bruit, no dizziness with neck extension Abdomen: Soft, + BS.  Non tender, no guarding. Lymphatics: Non tender without lymphadenopathy.  Musculoskeletal: Full ROM, 5/5 strength, slow steady gait  Skin: Warm, dry without rashes, lesions, ecchymosis.  Neuro: Cranial nerves intact. Normal muscle tone, Sensation intact. Negative nose to finger, rapid alternating, heel to shin. Immediately dizzy and unsteady with standing - however Romberg is negative. Slow steady regular gait, unable to complete heel to toe, very unstable  Psych: Awake and oriented X 3, normal affect, Insight and Judgment appropriate.     Izora Ribas, NP 10:42 AM [facility]

## 2019-10-18 NOTE — Patient Instructions (Addendum)
Recommend getting on claritin/allegra  Flonase/fluticasone nasal spray - 1-2 sprays in each nostril daily (short term only due to cataract - don't want to do this long term)  Recommend getting on a baby aspirin   PUSH fluid intake - aim for 65+ fluid ounces   Please follow up in 3-7 days with progress - if improved/resolved, no CT needed Let me know sooner if any worse, unsteady gait, weakness in leg    Dizziness Dizziness is a common problem. It is a feeling of unsteadiness or light-headedness. You may feel like you are about to faint. Dizziness can lead to injury if you stumble or fall. Anyone can become dizzy, but dizziness is more common in older adults. This condition can be caused by a number of things, including medicines, dehydration, or illness. Follow these instructions at home: Eating and drinking  Drink enough fluid to keep your urine clear or pale yellow. This helps to keep you from becoming dehydrated. Try to drink more clear fluids, such as water.  Do not drink alcohol.  Limit your caffeine intake if told to do so by your health care provider. Check ingredients and nutrition facts to see if a food or beverage contains caffeine.  Limit your salt (sodium) intake if told to do so by your health care provider. Check ingredients and nutrition facts to see if a food or beverage contains sodium. Activity  Avoid making quick movements. ? Rise slowly from chairs and steady yourself until you feel okay. ? In the morning, first sit up on the side of the bed. When you feel okay, stand slowly while you hold onto something until you know that your balance is fine.  If you need to stand in one place for a long time, move your legs often. Tighten and relax the muscles in your legs while you are standing.  Do not drive or use heavy machinery if you feel dizzy.  Avoid bending down if you feel dizzy. Place items in your home so that they are easy for you to reach without leaning  over. Lifestyle  Do not use any products that contain nicotine or tobacco, such as cigarettes and e-cigarettes. If you need help quitting, ask your health care provider.  Try to reduce your stress level by using methods such as yoga or meditation. Talk with your health care provider if you need help to manage your stress. General instructions  Watch your dizziness for any changes.  Take over-the-counter and prescription medicines only as told by your health care provider. Talk with your health care provider if you think that your dizziness is caused by a medicine that you are taking.  Tell a friend or a family member that you are feeling dizzy. If he or she notices any changes in your behavior, have this person call your health care provider.  Keep all follow-up visits as told by your health care provider. This is important. Contact a health care provider if:  Your dizziness does not go away.  Your dizziness or light-headedness gets worse.  You feel nauseous.  You have reduced hearing.  You have new symptoms.  You are unsteady on your feet or you feel like the room is spinning. Get help right away if:  You vomit or have diarrhea and are unable to eat or drink anything.  You have problems talking, walking, swallowing, or using your arms, hands, or legs.  You feel generally weak.  You are not thinking clearly or you have  trouble forming sentences. It may take a friend or family member to notice this.  You have chest pain, abdominal pain, shortness of breath, or sweating.  Your vision changes.  You have any bleeding.  You have a severe headache.  You have neck pain or a stiff neck.  You have a fever. These symptoms may represent a serious problem that is an emergency. Do not wait to see if the symptoms will go away. Get medical help right away. Call your local emergency services (911 in the [place] Do not drive yourself to the hospital. Summary  Dizziness is a feeling  of unsteadiness or light-headedness. This condition can be caused by a number of things, including medicines, dehydration, or illness.  Anyone can become dizzy, but dizziness is more common in older adults.  Drink enough fluid to keep your urine clear or pale yellow. Do not drink alcohol.  Avoid making quick movements if you feel dizzy. Monitor your dizziness for any changes. This information is not intended to replace advice given to you by your health care provider. Make sure you discuss any questions you have with your health care provider. Document Revised: 01/03/2017 Document Reviewed: 02/03/2016 Elsevier Patient Education  2020 [employer].

## 2019-10-19 LAB — CBC WITH DIFFERENTIAL/PLATELET
Absolute Monocytes: 201 cells/uL (ref 200–950)
Basophils Absolute: 20 cells/uL (ref 0–200)
Basophils Relative: 0.6 %
Eosinophils Absolute: 50 cells/uL (ref 15–500)
Eosinophils Relative: 1.5 %
HCT: 35.1 % (ref 35.0–45.0)
Hemoglobin: 11.4 g/dL — ABNORMAL LOW (ref 11.7–15.5)
Lymphs Abs: 1122 cells/uL (ref 850–3900)
MCH: 29.8 pg (ref 27.0–33.0)
MCHC: 32.5 g/dL (ref 32.0–36.0)
MCV: 91.9 fL (ref 80.0–100.0)
MPV: 9.7 fL (ref 7.5–12.5)
Monocytes Relative: 6.1 %
Neutro Abs: 1907 cells/uL (ref 1500–7800)
Neutrophils Relative %: 57.8 %
Platelets: 240 10*3/uL (ref 140–400)
RBC: 3.82 10*6/uL (ref 3.80–5.10)
RDW: 12.1 % (ref 11.0–15.0)
Total Lymphocyte: 34 %
WBC: 3.3 10*3/uL — ABNORMAL LOW (ref 3.8–10.8)

## 2019-10-19 LAB — BASIC METABOLIC PANEL WITH GFR
BUN: 10 mg/dL (ref 7–25)
CO2: 31 mmol/L (ref 20–32)
Calcium: 9.5 mg/dL (ref 8.6–10.4)
Chloride: 105 mmol/L (ref 98–110)
Creat: 0.88 mg/dL (ref 0.60–0.93)
GFR, Est African American: 72 mL/min/{1.73_m2} (ref >=60)
GFR, Est Non African American: 62 mL/min/{1.73_m2} (ref >=60)
Glucose, Bld: 93 mg/dL (ref 65–99)
Potassium: 4.2 mmol/L (ref 3.5–5.3)
Sodium: 142 mmol/L (ref 135–146)

## 2019-10-19 LAB — URINALYSIS W MICROSCOPIC + REFLEX CULTURE
Bacteria, UA: NONE SEEN /HPF
Bilirubin Urine: NEGATIVE
Glucose, UA: NEGATIVE
Hgb urine dipstick: NEGATIVE
Hyaline Cast: NONE SEEN /LPF
Ketones, ur: NEGATIVE
Leukocyte Esterase: NEGATIVE
Nitrites, Initial: NEGATIVE
Protein, ur: NEGATIVE
RBC / HPF: NONE SEEN /HPF (ref 0–2)
Specific Gravity, Urine: 1.008 (ref 1.001–1.03)
Squamous Epithelial / HPF: NONE SEEN /HPF (ref <=5)
WBC, UA: NONE SEEN /HPF (ref 0–5)
pH: 7.5 (ref 5.0–8.0)

## 2019-10-19 LAB — NO CULTURE INDICATED

## 2019-10-19 NOTE — Progress Notes (Signed)
Patient is aware of lab results and instructions.  --She states that she has started anti-histamine and Flonase. She woke up dizzy this morning. She is going to continue medications and call our office next week to let us know how she is doing. Marcelino Scot

## 2019-10-20 DIAGNOSIS — H40013 Open angle with borderline findings, low risk, bilateral: Secondary | ICD-10-CM | POA: Diagnosis not present

## 2019-10-29 ENCOUNTER — Ambulatory Visit
Admission: RE | Admit: 2019-10-29 | Discharge: 2019-10-29 | Disposition: A | Discharge status: Home / Self Care | Payer: PPO | Source: Ambulatory Visit | Attending: Internal Medicine | Admitting: Internal Medicine

## 2019-10-29 ENCOUNTER — Other Ambulatory Visit: Payer: Self-pay

## 2019-10-29 DIAGNOSIS — Z1231 Encounter for screening mammogram for malignant neoplasm of breast: Secondary | ICD-10-CM | POA: Diagnosis not present

## 2020-01-13 ENCOUNTER — Encounter: Payer: PPO | Admitting: Adult Health Nurse Practitioner

## 2020-01-24 ENCOUNTER — Encounter: Payer: Self-pay | Admitting: Adult Health

## 2020-01-24 NOTE — Progress Notes (Deleted)
CPE AND 3 month OV  Assessment:   Gastroesophageal reflux disease, unspecified whether esophagitis present -     famotidine (PEPCID) 40 MG tablet; Take 1 tablet (40 mg total) by mouth every evening. + Epigastric pain, will treat with pepcid, diet discussed.  Throat clearing -     famotidine (PEPCID) 40 MG tablet; Take 1 tablet (40 mg total) by mouth every evening. Normal evaluation, normal thyroid, no lymphadenopathy ? From allergies versus GERD, will treat GERD, if not better can try claritin or will refer to ENT for evaluation. Patient agrees with pain.   Encounter for general adult medical examination with abnormal findings 1 year Suggest getting shingles vaccine at least- info given  Hyperlipidemia, unspecified hyperlipidemia type -     Lipid Profile check lipids decrease fatty foods increase activity.   Essential hypertension -     CBC with Diff -     COMPLETE METABOLIC PANEL WITH GFR -     TSH -     Urinalysis, Routine w reflex microscopic -     Microalbumin / Creatinine Urine Ratio -     EKG 12-Lead - continue medications, DASH diet, exercise and monitor at home. Call if greater than 130/80.   Medication management -     Magnesium  Other abnormal glucose -     Hemoglobin A1c ([facility]) Discussed disease progression and risks Discussed diet/exercise, weight management and risk modification  Vitamin D deficiency -     Vitamin D (25 hydroxy)  Anemia, unspecified type Check CBC  Glaucoma, unspecified glaucoma type, unspecified laterality Continue follow up eye doctor  Benign neoplasm of colon, unspecified part of colon Discussed colonoscopy versus cologuard, discussed colonoscopy is better however something is better than nothing. ***  No orders of the defined types were placed in this encounter.     Future Appointments  Date Time Provider [place]  01/25/2020 10:00 AM Liane Comber, NP [facility] None     Subjective:   Robin Zimmerman is a non smoking 80 y.o. female who presents for CPE and 3 month follow up for HTN, chol, glucose management, vitamin D.   She complains of throat clearing, states she has had PND and has throat clearing in the past but it is getting worse. Worse in the AM. She is not on anything for nasal drip. "feels stuck" in her throat.  No sore throat, no trouble swallowing, no pills/food getting caught.   BMI is There is no height or weight on file to calculate BMI., she has been working on diet and exercise. Wt Readings from Last 3 Encounters:  10/18/19 148 lb (67.1 kg)  07/09/19 144 lb 9.6 oz (65.6 kg)  01/13/19 145 lb 3.2 oz (65.9 kg)   Her blood pressure has been controlled at home, today their BP is   She does not workout, very sporadic.  She denies chest pain, shortness of breath, dizziness.   She is not on cholesterol medication and denies myalgias. Her cholesterol is not at goal. The cholesterol last visit was:   Lab Results  Component Value Date   CHOL 256 (H) 07/09/2019   HDL 94 07/09/2019   LDLCALC 146 (H) 07/09/2019   TRIG 69 07/09/2019   CHOLHDL 2.7 07/09/2019   She has been working on diet and exercise for glucose management, and denies polydipsia, polyuria and visual disturbances. Last A1C in the office was:  Lab Results  Component Value Date   HGBA1C 5.5 01/13/2019   Patient is on Vitamin  D supplement, 10000 IU daily. Lab Results  Component Value Date   VD25OH 41 01/13/2019    CBC Latest Ref Rng & Units 10/18/2019 07/09/2019 01/13/2019  WBC 3.8 - 10.8 Thousand/uL 3.3(L) 2.8(L) 3.2(L)  Hemoglobin 11.7 - 15.5 g/dL 11.4(L) 11.3(L) 11.6(L)  Hematocrit 35.0 - 45.0 % 35.1 34.6(L) 35.2  Platelets 140 - 400 Thousand/uL 240 231 264   Lab Results  Component Value Date   IRON 76 07/20/2014   TIBC 273 07/20/2014   FERRITIN 164 07/20/2014   Lab Results  Component Value Date   VITAMINB12 302 07/06/2013      Medication Review     Current Outpatient Medications  (Analgesics):  .  aspirin EC 81 MG tablet, Take 1 tablet (81 mg total) by mouth daily. Swallow whole.  Current Outpatient Medications (Hematological):  Marland Kitchen  Cyanocobalamin (B-12) 500 MCG TABS, Take by mouth daily. .  IRON PO, Take by mouth daily.  Current Outpatient Medications (Other):  Marland Kitchen  Cholecalciferol (VITAMIN D PO), Take 5,000 Int'l Units by mouth daily. .  famotidine (PEPCID) 40 MG tablet, Take 1 tablet (40 mg total) by mouth every evening. .  latanoprost (XALATAN) 0.005 % ophthalmic solution,  .  Magnesium 250 MG TABS, Take by mouth daily. .  meclizine (ANTIVERT) 25 MG tablet, 1/2-1 pill up to 3 times daily for motion sickness/dizziness  Current Problems (verified) Patient Active Problem List   Diagnosis Date Noted  . Seasonal allergies 10/18/2019  . Pes planus 02/15/2016  . Posterior tibial tendinitis of right lower extremity 01/25/2016  . Absolute anemia 07/20/2014  . Glaucoma 04/14/2014  . Vitamin D deficiency 04/04/2013  . Medication management 04/04/2013  . Other abnormal glucose 01/19/2013  . GERD (gastroesophageal reflux disease)   . Hyperlipidemia   . Hypertension   . Benign neoplasm of colon 02/16/2007    Screening Tests Immunization History  Administered Date(s) Administered  . Td 06/30/2012    Preventative care: Last colonoscopy: 2014 due 09/2017- will consider cologuard though we discussed that the colonoscopy is a better test *** Last mammogram: 10/29/2019 Last pap smear/pelvic exam: 2012  remote DEXA: 2017 -0.9 normal  Prior vaccinations: TD or Tdap: 2014 Influenza: declines Prevnar 13 Declines Pneumococcal: declines Shingles/Zostavax: discussed Covid 19: ***  Names of Other Physician/Practitioners you currently use: 1. [facility]- here for primary care 2. Dr. Carollee Sires, [facility], eye doctor, last visit 2019 3. , dental, last visit - remote, encouraged to follow up   Patient Care Team: Unk Pinto, MD as PCP - General (Internal Medicine) Juanita Craver, MD as Consulting Physician (Gastroenterology)   Allergies Allergies  Allergen Reactions  . Prednisone Other (See Comments)    Hallucinations    SURGICAL HISTORY She  has a past surgical history that includes Tubal ligation; Cholecystectomy; and Cataract extraction (Right, 06/16/2017). FAMILY HISTORY Her family history includes Alzheimer's disease in her father; Heart disease in her mother. SOCIAL HISTORY She  reports that she quit smoking about 45 years ago. She has never used smokeless tobacco. She reports that she does not drink alcohol and does not use drugs.  Review of Systems  Constitutional: Negative for malaise/fatigue and weight loss.  HENT: Negative for hearing loss and tinnitus.   Eyes: Negative for blurred vision and double vision.  Respiratory: Negative for cough, shortness of breath and wheezing.   Cardiovascular: Negative for chest pain, palpitations, orthopnea, claudication and leg swelling.  Gastrointestinal: Negative for abdominal pain, blood in stool, constipation, diarrhea, heartburn, melena,  nausea and vomiting.  Genitourinary: Negative.   Musculoskeletal: Negative for joint pain and myalgias.  Skin: Negative for rash.  Neurological: Negative for dizziness, tingling, sensory change, weakness and headaches.  Endo/Heme/Allergies: Negative for polydipsia.  Psychiatric/Behavioral: Negative.   All other systems reviewed and are negative.     Objective:     There were no vitals taken for this visit. There is no height or weight on file to calculate BMI.  General appearance: alert, no distress, WD/WN,  female HEENT: normocephalic, sclerae anicteric, TMs pearly, nares patent, no discharge or erythema, pharynx normal Oral cavity: MMM, no lesions Neck: supple, no lymphadenopathy, no thyromegaly, no masses Heart: RRR, normal S1, S2, no murmurs Lungs: CTA bilaterally, no wheezes, rhonchi, or  rales Abdomen: +bs, soft, + epigastric tenderness, non distended, no rebound, no masses, no hepatomegaly, no splenomegaly Musculoskeletal: nontender, no swelling, no obvious deformity  Extremities: no edema, no cyanosis, no clubbing Skin: Seb keratosis over back and AB- unchanged ** Pulses: 2+ symmetric, upper and lower extremities, normal cap refill Neurological: alert, oriented x 3, CN2-12 intact, strength normal upper extremities and lower extremities, sensation normal throughout, DTRs 2+ throughout, no cerebellar signs, gait normal Psychiatric: normal affect, behavior normal, pleasant  Breasts *** Gyn: defer  Rectal: defer  EKG: ***  Izora Ribas, NP   01/24/2020

## 2020-01-25 ENCOUNTER — Encounter: Payer: PPO | Admitting: Adult Health

## 2020-02-14 NOTE — Progress Notes (Signed)
Medicare AND 3 month OV  Assessment:    Encounter for general adult medical examination with abnormal findings 1 year  Hyperlipidemia, unspecified hyperlipidemia type -     Lipid Profile check lipids decrease fatty foods increase activity.  Low risk hx, prefers to avoid meds, continue to discuss lifestyle changes Consider fiber supplement   Essential hypertension -     CBC with Diff -     COMPLETE METABOLIC PANEL WITH GFR -     TSH - continue medications, DASH diet, exercise and monitor at home. Call if greater than 130/80.   Medication management -     Magnesium  Other abnormal glucose -     Hemoglobin A1c ([facility]) Discussed disease progression and risks Discussed diet/exercise, weight management and risk modification  Vitamin D deficiency -     Vitamin D (25 hydroxy)  Anemia, unspecified type Check CBC Recheck iron, B12 (last borderline), folate as hasn't had in several years  Glaucoma, unspecified glaucoma type, unspecified laterality Continue follow up eye doctor  History of colon polyps Overdue follow up Discussed colonoscopy and referral placed  Gastroesophageal reflux disease, unspecified whether esophagitis present Well managed on current medications Discussed diet, avoiding triggers and other lifestyle changes  R hip lateral pain; tendonitis/bursitis Full ROM, ambulates well, low suspicion for bony abnormality Suspect bursitis/mild tendonitis Start meloxicam daily x 2 weeks then PRN Exercises given Follow up if no improvement in 2 weeks; consider imaging and ortho/PT referral    Orders Placed This Encounter  Procedures  . CBC with Differential/Platelet  . COMPLETE METABOLIC PANEL WITH GFR  . Magnesium  . Lipid panel  . TSH  . VITAMIN D 25 Hydroxy (Vit-D Deficiency, Fractures)  . Vitamin B12  . Iron, TIBC and Ferritin Panel  . Folate RBC  . Ambulatory referral to Gastroenterology      Future Appointments  Date Time Provider [place]  02/15/2021  9:30 AM Liane Comber, NP [facility] None     Subjective:   Robin Zimmerman is a non smoking 80 y.o. female who presents for CPE and 3 month follow up for HTN, chol, glucose management, vitamin D.   She has GERD, was having persistent throat clearing, improved on famotidine.  She reports new R hip pain; reports fell stepping off of a curb, fell to her hands and knees, no immediate pain but noted gradual onset in the days following. Reports now having lateral pain with walking, sitting to standing, climbing climbing stairs. Reports 3/10, non-radiating, only with aggravating activities.   BMI is Body mass index is 24.17 kg/m., she has been working on diet and exercise, was walking weekly but less since recent snow.  Wt Readings from Last 3 Encounters:  02/16/20 143 lb (64.9 kg)  10/18/19 148 lb (67.1 kg)  07/09/19 144 lb 9.6 oz (65.6 kg)   Her blood pressure has been controlled at home, today their BP is BP: 132/70 She does not workout, very sporadic.  She denies chest pain, shortness of breath, dizziness.    She is not on cholesterol medication and denies myalgias. Her cholesterol is not at goal. Her mother had hx of CVA in her 15s, patient does not have other risk factors and patient strongly prefers to avoid medications. The cholesterol last visit was:   Lab Results  Component Value Date   CHOL 256 (H) 07/09/2019   HDL 94 07/09/2019   LDLCALC 146 (H) 07/09/2019   TRIG 69 07/09/2019   CHOLHDL 2.7 07/09/2019  She has been working on diet and exercise for glucose management, and denies polydipsia, polyuria and visual disturbances. Last A1C in the office was:  Lab Results  Component Value Date   HGBA1C 5.5 01/13/2019   Patient is on Vitamin D supplement, 10000 IU daily. Lab Results  Component Value Date   VD25OH 41 01/13/2019   She has stable anemia, hgb in 11 range, predates 2015;   CBC Latest Ref Rng & Units 10/18/2019 07/09/2019 01/13/2019  WBC 3.8  - 10.8 Thousand/uL 3.3(L) 2.8(L) 3.2(L)  Hemoglobin 11.7 - 15.5 g/dL 11.4(L) 11.3(L) 11.6(L)  Hematocrit 35.0 - 45.0 % 35.1 34.6(L) 35.2  Platelets 140 - 400 Thousand/uL 240 231 264   Lab Results  Component Value Date   IRON 76 07/20/2014   TIBC 273 07/20/2014   FERRITIN 164 07/20/2014   B12 has been low, patient is taking daily supplement, 500 mcg tab Lab Results  Component Value Date   VITAMINB12 302 07/06/2013      Medication Review     Current Outpatient Medications (Analgesics):  .  meloxicam (MOBIC) 15 MG tablet, Take one daily with food for 2 weeks, can take with tylenol, can not take with aleve, iburpofen, then as needed daily for pain  Current Outpatient Medications (Hematological):  Marland Kitchen  Cyanocobalamin (B-12) 500 MCG TABS, Take by mouth daily. .  IRON PO, Take by mouth daily.  Current Outpatient Medications (Other):  Marland Kitchen  Cholecalciferol (VITAMIN D PO), Take 5,000 Int'l Units by mouth daily. .  famotidine (PEPCID) 40 MG tablet, Take 1 tablet (40 mg total) by mouth every evening. .  latanoprost (XALATAN) 0.005 % ophthalmic solution,  .  Magnesium 250 MG TABS, Take by mouth daily.  Current Problems (verified) Patient Active Problem List   Diagnosis Date Noted  . Seasonal allergies 10/18/2019  . Pes planus 02/15/2016  . Absolute anemia 07/20/2014  . Glaucoma 04/14/2014  . Vitamin D deficiency 04/04/2013  . Medication management 04/04/2013  . Other abnormal glucose 01/19/2013  . GERD (gastroesophageal reflux disease)   . Hyperlipidemia   . Hypertension   . History of colon polyps 02/16/2007    Screening Tests Immunization History  Administered Date(s) Administered  . PFIZER Comirnaty(Gray Top)Covid-19 Tri-Sucrose Vaccine 04/23/2019, 05/18/2019  . PFIZER SARS-COV-2 Pediatric Vaccination 02/14/2020  . Td 06/30/2012    Preventative care: Last colonoscopy: 2014 due 09/2017-  Last mammogram: 10/29/2019 Last pap smear/pelvic exam: 2012  remote DEXA: 2017  -0.9 normal  Prior vaccinations: TD or Tdap: 2014 Influenza: declines Prevnar 13 Declines Pneumococcal: declines Shingles/Zostavax: discussed Covid 19: 3/3, pfizer  Names of Other Physician/Practitioners you currently use: 1.  [facility]- here for [facility] 2. Dr. ?, Olin Hauser, eye doctor, last visit 2021, mild L cataract 3. , dental, last visit - remote, encouraged to follow up   Patient Care Team: Unk Pinto, MD as PCP - General (Internal Medicine) Juanita Craver, MD as Consulting Physician (Gastroenterology)   Allergies Allergies  Allergen Reactions  . Prednisone Other (See Comments)    Hallucinations    SURGICAL HISTORY She  has a past surgical history that includes Tubal ligation; Cholecystectomy; and Cataract extraction (Right, 06/16/2017). FAMILY HISTORY Her family history includes Alzheimer's disease in her father; Heart disease in her mother. SOCIAL HISTORY She  reports that she quit smoking about 45 years ago. She has never used smokeless tobacco. She reports that she does not drink alcohol and does not use drugs.  Review of Systems  Constitutional: Negative for  malaise/fatigue and weight loss.  HENT: Negative for hearing loss and tinnitus.   Eyes: Negative for blurred vision and double vision.  Respiratory: Negative for cough, shortness of breath and wheezing.   Cardiovascular: Negative for chest pain, palpitations, orthopnea, claudication and leg swelling.  Gastrointestinal: Negative for abdominal pain, blood in stool, constipation, diarrhea, heartburn, melena, nausea and vomiting.  Genitourinary: Negative.   Musculoskeletal: Positive for falls and joint pain (R hip lateral ). Negative for myalgias.  Skin: Negative for rash.  Neurological: Negative for dizziness, tingling, sensory change, weakness and headaches.  Endo/Heme/Allergies: Negative for polydipsia.  Psychiatric/Behavioral: Negative.   All other  systems reviewed and are negative.     Objective:     Blood pressure 132/70, pulse 79, temperature (!) 97 F (36.1 C), height 5' 4.5" (1.638 m), weight 143 lb (64.9 kg), SpO2 98 %. Body mass index is 24.17 kg/m.  General appearance: alert, no distress, WD/WN,  female HEENT: normocephalic, sclerae anicteric, TMs pearly, nares patent, no discharge or erythema, pharynx normal Oral cavity: MMM, no lesions Neck: supple, no lymphadenopathy, no thyromegaly, no masses Heart: RRR, normal S1, S2, no murmurs Lungs: CTA bilaterally, no wheezes, rhonchi, or rales Abdomen: +bs, soft, non-tender, non distended, no rebound, no masses, no hepatomegaly, no splenomegaly Musculoskeletal: full ROM, no swelling, no obvious deformity. Lateral tenderness over trochanter and surrounding musculature Extremities: no edema, no cyanosis, no clubbing Skin: Seb keratosis over back and AB- unchanged  Pulses: 2+ symmetric, upper and lower extremities, normal cap refill Neurological: alert, oriented x 3, CN2-12 intact, strength normal upper extremities and lower extremities, sensation normal throughout, DTRs 2+ throughout, no cerebellar signs, gait normal Psychiatric: normal affect, behavior normal, pleasant     Izora Ribas, NP   02/16/2020

## 2020-02-16 ENCOUNTER — Ambulatory Visit (INDEPENDENT_AMBULATORY_CARE_PROVIDER_SITE_OTHER): Payer: PPO | Admitting: Adult Health

## 2020-02-16 ENCOUNTER — Encounter: Payer: Self-pay | Admitting: Adult Health

## 2020-02-16 ENCOUNTER — Other Ambulatory Visit: Payer: Self-pay

## 2020-02-16 VITALS — BP 132/70 | HR 79 | Temp 97.0°F | Ht 64.5 in | Wt 143.0 lb

## 2020-02-16 DIAGNOSIS — R6889 Other general symptoms and signs: Secondary | ICD-10-CM | POA: Diagnosis not present

## 2020-02-16 DIAGNOSIS — Z86010 Personal history of colonic polyps: Secondary | ICD-10-CM | POA: Diagnosis not present

## 2020-02-16 DIAGNOSIS — H409 Unspecified glaucoma: Secondary | ICD-10-CM | POA: Diagnosis not present

## 2020-02-16 DIAGNOSIS — E785 Hyperlipidemia, unspecified: Secondary | ICD-10-CM | POA: Diagnosis not present

## 2020-02-16 DIAGNOSIS — Z79899 Other long term (current) drug therapy: Secondary | ICD-10-CM

## 2020-02-16 DIAGNOSIS — K219 Gastro-esophageal reflux disease without esophagitis: Secondary | ICD-10-CM | POA: Diagnosis not present

## 2020-02-16 DIAGNOSIS — D72819 Decreased white blood cell count, unspecified: Secondary | ICD-10-CM

## 2020-02-16 DIAGNOSIS — E559 Vitamin D deficiency, unspecified: Secondary | ICD-10-CM

## 2020-02-16 DIAGNOSIS — D649 Anemia, unspecified: Secondary | ICD-10-CM

## 2020-02-16 DIAGNOSIS — R7309 Other abnormal glucose: Secondary | ICD-10-CM

## 2020-02-16 DIAGNOSIS — Z0000 Encounter for general adult medical examination without abnormal findings: Secondary | ICD-10-CM

## 2020-02-16 DIAGNOSIS — Z0001 Encounter for general adult medical examination with abnormal findings: Secondary | ICD-10-CM

## 2020-02-16 DIAGNOSIS — M76891 Other specified enthesopathies of right lower limb, excluding foot: Secondary | ICD-10-CM | POA: Diagnosis not present

## 2020-02-16 DIAGNOSIS — M2141 Flat foot [pes planus] (acquired), right foot: Secondary | ICD-10-CM

## 2020-02-16 DIAGNOSIS — D7589 Other specified diseases of blood and blood-forming organs: Secondary | ICD-10-CM | POA: Diagnosis not present

## 2020-02-16 DIAGNOSIS — M2142 Flat foot [pes planus] (acquired), left foot: Secondary | ICD-10-CM

## 2020-02-16 DIAGNOSIS — J302 Other seasonal allergic rhinitis: Secondary | ICD-10-CM | POA: Diagnosis not present

## 2020-02-16 DIAGNOSIS — I10 Essential (primary) hypertension: Secondary | ICD-10-CM

## 2020-02-16 MED ORDER — MELOXICAM 15 MG PO TABS: ORAL_TABLET | ORAL | 0 refills | Status: DC

## 2020-02-16 NOTE — Patient Instructions (Addendum)
Robin Zimmerman , Thank you for taking time to come for your Medicare Wellness Visit. I appreciate your ongoing commitment to your health goals. Please review the following plan we discussed and let me know if I can assist you in the future.   These are the goals we discussed: Goals    . LDL CALC < 130       This is a list of the screening recommended for you and due dates:  Health Maintenance  Topic Date Due  . COVID-19 Vaccine (3 - Booster for Pfizer series) 11/18/2019  . Flu Shot  04/13/2020*  .  Hepatitis C: One time screening is recommended by Center for Disease Control  (CDC) for  adults born from [birth year] through 1965.   02/15/2021*  . Pneumonia vaccines (1 of 2 - PCV13) 02/15/2021*  . Tetanus Vaccine  07/01/2022  . DEXA scan (bone density measurement)  Completed  *Topic was postponed. The date shown is not the original due date.         Hip Bursitis  Hip bursitis is swelling of one or more fluid-filled sacs (bursae) in your hip joint. This condition can cause pain, and your symptoms may come and go over time. What are the causes?  Repeated use of your hip muscles.  Injury to the hip.  Weak butt muscles.  Bone spurs.  Infection. In some cases, the cause may not be known. What increases the risk? You are more likely to develop this condition if:  You had a past hip injury or hip surgery.  You have a condition, such as arthritis, gout, diabetes, or thyroid disease.  You have spine problems.  You have one leg that is shorter than the other.  You run a lot or do long-distance running.  You play sports where there is a risk of injury or falling, such as football, martial arts, or skiing. What are the signs or symptoms? Symptoms may come and go, and they often include:  Pain in the hip or groin area. Pain may get worse when you move your hip.  Tenderness and swelling of the hip. In rare cases, the bursa may become infected. If this happens, you may get a  fever, as well as have warmth and redness in the hip area. How is this treated? This condition is treated by:  Resting your hip.  Icing your hip.  Wrapping the hip area with an elastic bandage (compression wrap).  Keeping the hip raised. Other treatments may include medicine, draining fluid out of the bursa, or using crutches, a cane, or a walker. Surgery may be needed, but this is rare. Long-term treatment may include doing exercises to help your strength and flexibility. It may also include lifestyle changes like losing weight to lessen the strain on your hip. Follow these instructions at home: Managing pain, stiffness, and swelling  If told, put ice on the painful area. ? Put ice in a plastic bag. ? Place a towel between your skin and the bag. ? Leave the ice on for 20 minutes, 2-3 times a day.  Raise your hip by putting a pillow under your hips while you lie down. Stop if you feel pain.  If told, put heat on the affected area. Do this as often as told by your doctor. Use a moist heat pack or a heating pad as told by your doctor. ? Place a towel between your skin and the heat source. ? Leave the heat on for 20-30 minutes. ? Take  off the heat if your skin turns bright red. This is very important if you are unable to feel pain, heat, or cold. You may have a greater risk of getting burned.      Activity  Do not use your hip to support your body weight until your doctor says that you can.  Use crutches, a cane, or a walker as told by your doctor.  If the affected leg is one that you use to drive, ask your doctor if it is safe to drive.  Rest and protect your hip as much as you can until you feel better.  Return to your normal activities as told by your doctor. Ask your doctor what activities are safe for you.  Do exercises as told by your doctor. General instructions  Take over-the-counter and prescription medicines only as told by your doctor.  Gently rub and stretch  your injured area as often as is comfortable.  Wear elastic bandages only as told by your doctor.  If one of your legs is shorter than the other, get fitted for a shoe insert or orthotic.  Keep a healthy weight. Follow instructions from your doctor.  Keep all follow-up visits as told by your doctor. This is important. How is this prevented?  Exercise regularly, as told by your doctor.  Wear the right shoes for the sport you play.  Warm up and stretch before being active. Cool down and stretch after being active.  Take breaks often from repeated activity.  Avoid activities that bother your hip or cause pain.  Avoid sitting down for a long time. Where to find more information  American Academy of Orthopa[employer]geons: orthoinfo.aaos.org Contact a doctor if:  You have a fever.  You have new symptoms.  You have trouble walking or doing everyday activities.  You have pain that gets worse or does not get better with medicine.  Your skin around your hip is red.  You get a feeling of warmth in your hip area. Get help right away if:  You cannot move your hip.  You have very bad pain.  You cannot control the muscles in your feet. Summary  Hip bursitis is swelling of one or more fluid-filled sacs (bursae) in your hip joint.  Symptoms often come and go over time.  This condition is often treated by resting and icing the hip. It also may help to keep the area raised and wrapped in an elastic bandage. Other treatments may be needed. This information is not intended to replace advice given to you by your health care provider. Make sure you discuss any questions you have with your health care provider. Document Revised: 11/02/2018 Document Reviewed: 09/08/2017 Elsevier Patient Education  2021 [place].       Hip Bursitis Rehab Ask your health care provider which exercises are safe for you. Do exercises exactly as told by your health care provider and adjust them as  directed. It is normal to feel mild stretching, pulling, tightness, or discomfort as you do these exercises. Stop right away if you feel sudden pain or your pain gets worse. Do not begin these exercises until told by your health care provider. Stretching exercise This exercise warms up your muscles and joints and improves the movement and flexibility of your hip. This exercise also helps to relieve pain and stiffness. Iliotibial band stretch An iliotibial band is a strong band of muscle tissue that runs from the outer side of your hip to the outer side of  your thigh and knee. 1. Lie on your side with your left / right leg in the top position. 2. Bend your left / right knee and grab your ankle. Stretch out your bottom arm to help you balance. 3. Slowly bring your knee back so your thigh is behind your body. 4. Slowly lower your knee toward the floor until you feel a gentle stretch on the outside of your left / right thigh. If you do not feel a stretch and your knee will not fall farther, place the heel of your other foot on top of your knee and pull your knee down toward the floor with your foot. 5. Hold this position for __________ seconds. 6. Slowly return to the starting position. Repeat __________ times. Complete this exercise __________ times a day.   Strengthening exercises These exercises build strength and endurance in your hip and pelvis. Endurance is the ability to use your muscles for a long time, even after they get tired. Bridge This exercise strengthens the muscles that move your thigh backward (hip extensors). 1. Lie on your back on a firm surface with your knees bent and your feet flat on the floor. 2. Tighten your buttocks muscles and lift your buttocks off the floor until your trunk is level with your thighs. ? Do not arch your back. ? You should feel the muscles working in your buttocks and the back of your thighs. If you do not feel these muscles, slide your feet 1-2 inches  (2.5-5 cm) farther away from your buttocks. ? If this exercise is too easy, try doing it with your arms crossed over your chest. 3. Hold this position for __________ seconds. 4. Slowly lower your hips to the starting position. 5. Let your muscles relax completely after each repetition. Repeat __________ times. Complete this exercise __________ times a day.   Squats This exercise strengthens the muscles in front of your thigh and knee (quadriceps). 1. Stand in front of a table, with your feet and knees pointing straight ahead. You may rest your hands on the table for balance but not for support. 2. Slowly bend your knees and lower your hips like you are going to sit in a chair. ? Keep your weight over your heels, not over your toes. ? Keep your lower legs upright so they are parallel with the table legs. ? Do not let your hips go lower than your knees. ? Do not bend lower than told by your health care provider. ? If your hip pain increases, do not bend as low. 3. Hold the squat position for __________ seconds. 4. Slowly push with your legs to return to standing. Do not use your hands to pull yourself to standing. Repeat __________ times. Complete this exercise __________ times a day. Hip hike 1. Stand sideways on a bottom step. Stand on your left / right leg with your other foot unsupported next to the step. You can hold on to the railing or wall for balance if needed. 2. Keep your knees straight and your torso square. Then lift your left / right hip up toward the ceiling. 3. Hold this position for __________ seconds. 4. Slowly let your left / right hip lower toward the floor, past the starting position. Your foot should get closer to the floor. Do not lean or bend your knees. Repeat __________ times. Complete this exercise __________ times a day. Single leg stand 1. Without shoes, stand near a railing or in a doorway. You may hold on to  the railing or door frame as needed for  balance. 2. Squeeze your left / right buttock muscles, then lift up your other foot. ? Do not let your left / right hip push out to the side. ? It is helpful to stand in front of a mirror for this exercise so you can watch your hip. 3. Hold this position for __________ seconds. Repeat __________ times. Complete this exercise __________ times a day. This information is not intended to replace advice given to you by your health care provider. Make sure you discuss any questions you have with your health care provider. Document Revised: 04/27/2018 Document Reviewed: 04/27/2018 Elsevier Patient Education  [place].

## 2020-02-17 ENCOUNTER — Encounter: Payer: Self-pay | Admitting: Adult Health

## 2020-02-17 DIAGNOSIS — E538 Deficiency of other specified B group vitamins: Secondary | ICD-10-CM | POA: Insufficient documentation

## 2020-02-17 DIAGNOSIS — D7589 Other specified diseases of blood and blood-forming organs: Secondary | ICD-10-CM | POA: Insufficient documentation

## 2020-02-19 LAB — COMPLETE METABOLIC PANEL WITH GFR
AG Ratio: 1.5 (calc) (ref 1.0–2.5)
ALT: 9 U/L (ref 6–29)
AST: 13 U/L (ref 10–35)
Albumin: 4.1 g/dL (ref 3.6–5.1)
Alkaline phosphatase (APISO): 63 U/L (ref 37–153)
BUN: 14 mg/dL (ref 7–25)
CO2: 31 mmol/L (ref 20–32)
Calcium: 9.5 mg/dL (ref 8.6–10.4)
Chloride: 105 mmol/L (ref 98–110)
Creat: 0.87 mg/dL (ref 0.60–0.93)
GFR, Est African American: 73 mL/min/{1.73_m2} (ref >=60)
GFR, Est Non African American: 63 mL/min/{1.73_m2} (ref >=60)
Globulin: 2.8 g/dL (calc) (ref 1.9–3.7)
Glucose, Bld: 86 mg/dL (ref 65–99)
Potassium: 4.3 mmol/L (ref 3.5–5.3)
Sodium: 142 mmol/L (ref 135–146)
Total Bilirubin: 0.5 mg/dL (ref 0.2–1.2)
Total Protein: 6.9 g/dL (ref 6.1–8.1)

## 2020-02-19 LAB — PATHOLOGIST SMEAR REVIEW

## 2020-02-19 LAB — CBC WITH DIFFERENTIAL/PLATELET
Absolute Monocytes: 273 cells/uL (ref 200–950)
Basophils Absolute: 19 cells/uL (ref 0–200)
Basophils Relative: 0.7 %
Eosinophils Absolute: 59 cells/uL (ref 15–500)
Eosinophils Relative: 2.2 %
HCT: 34.3 % — ABNORMAL LOW (ref 35.0–45.0)
Hemoglobin: 11.3 g/dL — ABNORMAL LOW (ref 11.7–15.5)
Lymphs Abs: 907 cells/uL (ref 850–3900)
MCH: 30 pg (ref 27.0–33.0)
MCHC: 32.9 g/dL (ref 32.0–36.0)
MCV: 91 fL (ref 80.0–100.0)
MPV: 9.7 fL (ref 7.5–12.5)
Monocytes Relative: 10.1 %
Neutro Abs: 1442 cells/uL — ABNORMAL LOW (ref 1500–7800)
Neutrophils Relative %: 53.4 %
Platelets: 249 10*3/uL (ref 140–400)
RBC: 3.77 10*6/uL — ABNORMAL LOW (ref 3.80–5.10)
RDW: 11.9 % (ref 11.0–15.0)
Total Lymphocyte: 33.6 %
WBC: 2.7 10*3/uL — ABNORMAL LOW (ref 3.8–10.8)

## 2020-02-19 LAB — IRON,TIBC AND FERRITIN PANEL
%SAT: 31 % (calc) (ref 16–45)
Ferritin: 206 ng/mL (ref 16–288)
Iron: 77 ug/dL (ref 45–160)
TIBC: 252 mcg/dL (calc) (ref 250–450)

## 2020-02-19 LAB — LIPID PANEL
Cholesterol: 258 mg/dL — ABNORMAL HIGH (ref <200)
HDL: 86 mg/dL (ref >=50)
LDL Cholesterol (Calc): 156 mg/dL (calc) — ABNORMAL HIGH
Non-HDL Cholesterol (Calc): 172 mg/dL (calc) — ABNORMAL HIGH (ref <130)
Total CHOL/HDL Ratio: 3 (calc) (ref <5.0)
Triglycerides: 61 mg/dL (ref <150)

## 2020-02-19 LAB — TEST AUTHORIZATION

## 2020-02-19 LAB — MAGNESIUM: Magnesium: 2.1 mg/dL (ref 1.5–2.5)

## 2020-02-19 LAB — VITAMIN D 25 HYDROXY (VIT D DEFICIENCY, FRACTURES): Vit D, 25-Hydroxy: 45 ng/mL (ref 30–100)

## 2020-02-19 LAB — FOLATE RBC: RBC Folate: 466 ng/mL RBC (ref >280)

## 2020-02-19 LAB — VITAMIN B12: Vitamin B-12: 364 pg/mL (ref 200–1100)

## 2020-02-19 LAB — TSH: TSH: 0.4 mIU/L (ref 0.40–4.50)

## 2020-03-01 ENCOUNTER — Telehealth: Payer: Self-pay | Admitting: *Deleted

## 2020-03-01 NOTE — Telephone Encounter (Signed)
Recent labs faxed to [facility].

## 2020-03-02 DIAGNOSIS — K5730 Diverticulosis of large intestine without perforation or abscess without bleeding: Secondary | ICD-10-CM | POA: Diagnosis not present

## 2020-03-02 DIAGNOSIS — K219 Gastro-esophageal reflux disease without esophagitis: Secondary | ICD-10-CM | POA: Diagnosis not present

## 2020-03-02 DIAGNOSIS — Z800 Family history of malignant neoplasm of digestive organs: Secondary | ICD-10-CM | POA: Diagnosis not present

## 2020-03-02 DIAGNOSIS — D509 Iron deficiency anemia, unspecified: Secondary | ICD-10-CM | POA: Diagnosis not present

## 2020-03-02 DIAGNOSIS — Z1211 Encounter for screening for malignant neoplasm of colon: Secondary | ICD-10-CM | POA: Diagnosis not present

## 2020-03-22 DIAGNOSIS — K635 Polyp of colon: Secondary | ICD-10-CM | POA: Diagnosis not present

## 2020-03-22 DIAGNOSIS — Z1211 Encounter for screening for malignant neoplasm of colon: Secondary | ICD-10-CM | POA: Diagnosis not present

## 2020-03-22 DIAGNOSIS — K5730 Diverticulosis of large intestine without perforation or abscess without bleeding: Secondary | ICD-10-CM | POA: Diagnosis not present

## 2020-03-22 DIAGNOSIS — Z86010 Personal history of colonic polyps: Secondary | ICD-10-CM | POA: Diagnosis not present

## 2020-03-22 DIAGNOSIS — D122 Benign neoplasm of ascending colon: Secondary | ICD-10-CM | POA: Diagnosis not present

## 2020-03-22 DIAGNOSIS — Z800 Family history of malignant neoplasm of digestive organs: Secondary | ICD-10-CM | POA: Diagnosis not present

## 2020-03-22 LAB — HM COLONOSCOPY

## 2020-03-24 ENCOUNTER — Encounter: Payer: Self-pay | Admitting: Adult Health

## 2020-03-28 ENCOUNTER — Encounter: Payer: Self-pay | Admitting: Internal Medicine

## 2020-08-03 IMAGING — MG DIGITAL SCREENING BILATERAL MAMMOGRAM WITH TOMO AND CAD
8 series · 9 of 24 positions shown · non-contrast
Comparison: Previous exam(s).

CLINICAL DATA: Screening.

EXAM:
DIGITAL SCREENING BILATERAL MAMMOGRAM WITH TOMO AND CAD

[R CC synth-2D]
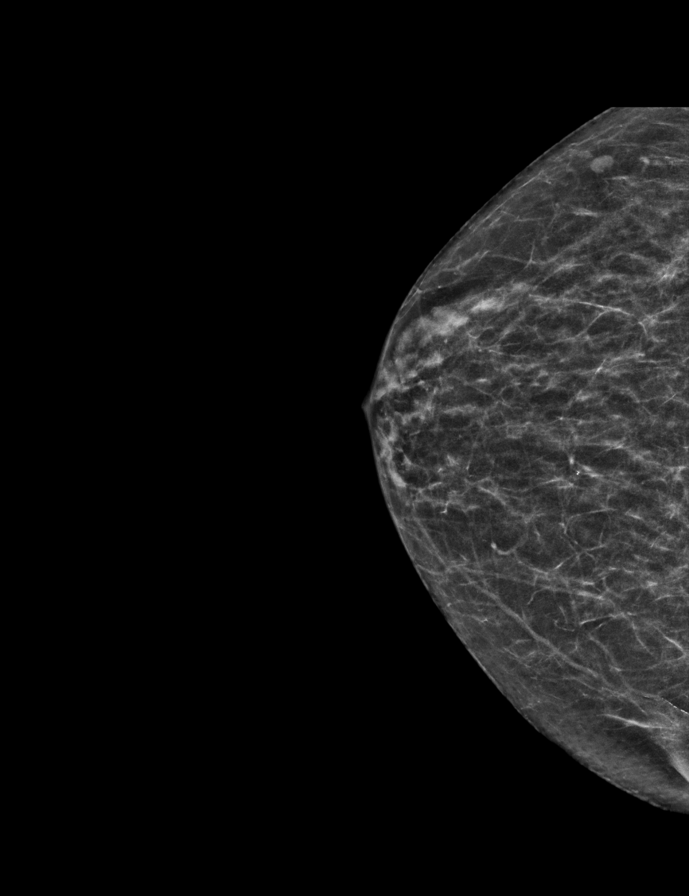

[L MLO synth-2D]
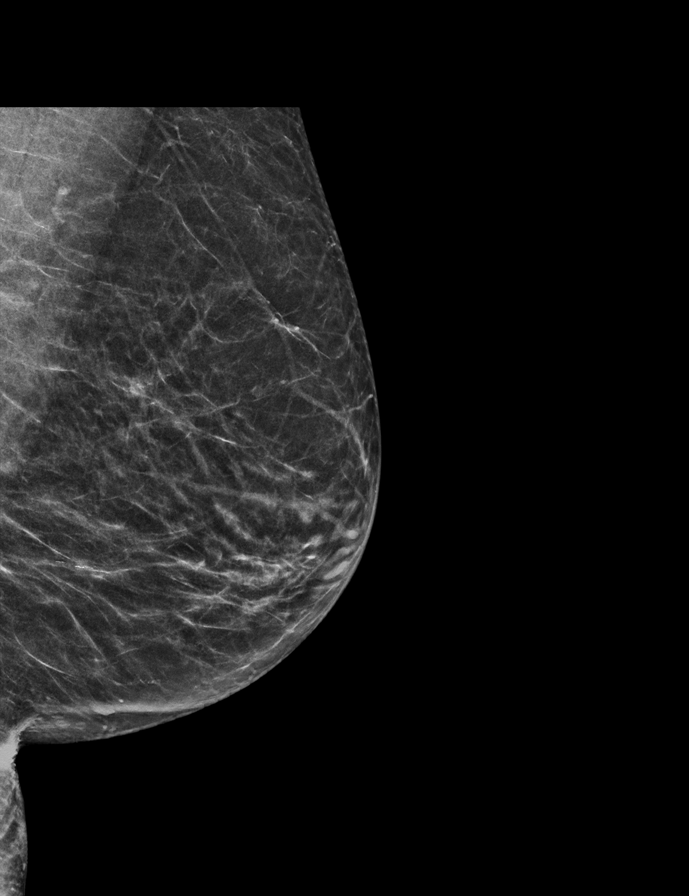

[R MLO synth-2D]
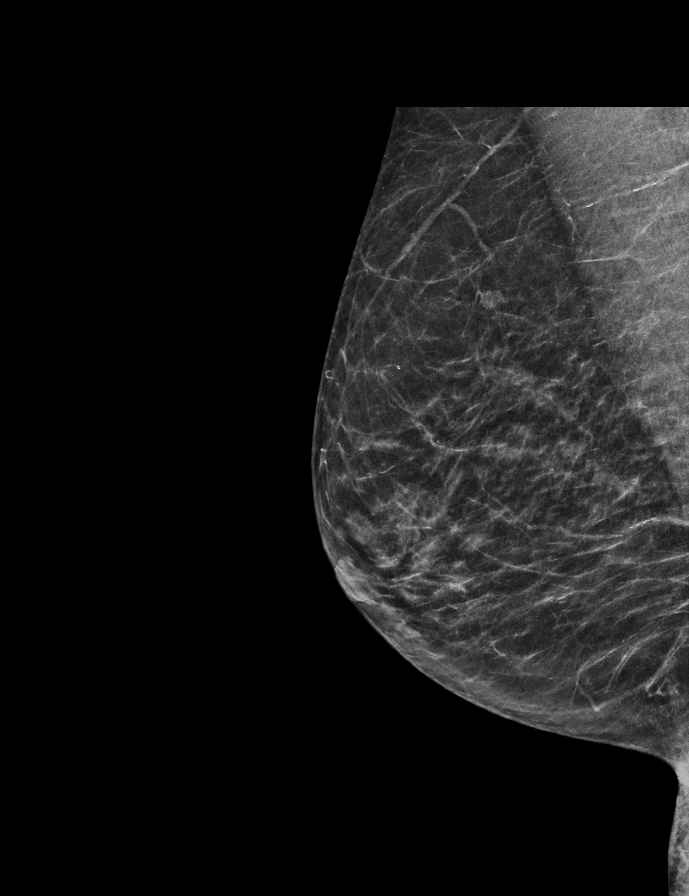

[L CC synth-2D]
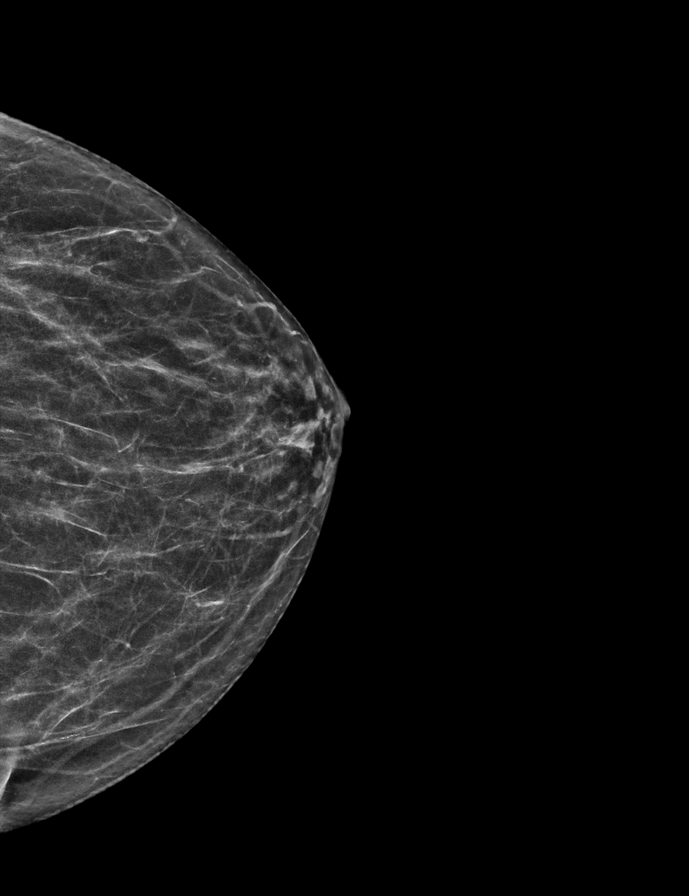

[L MLO tomo · 2 of 51 frames shown]
[frame 17/51]
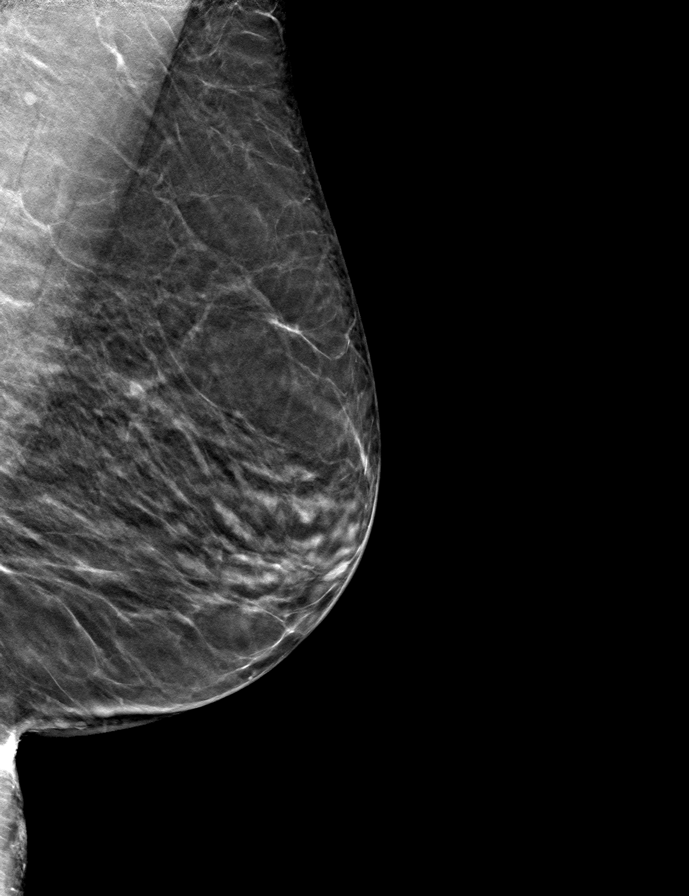
[frame 26/51]
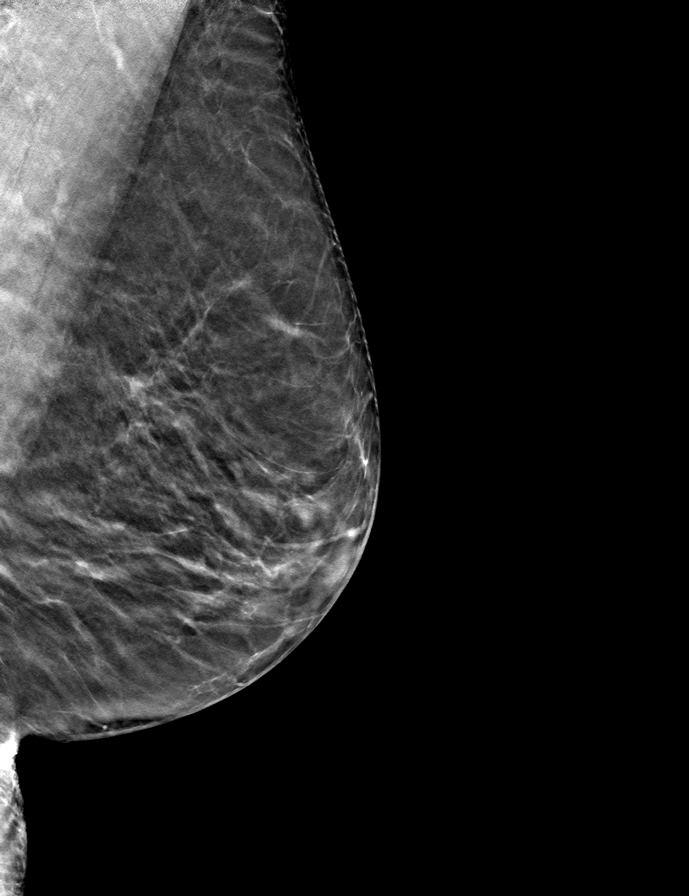

[L CC tomo · tomo slice 26/51.0]
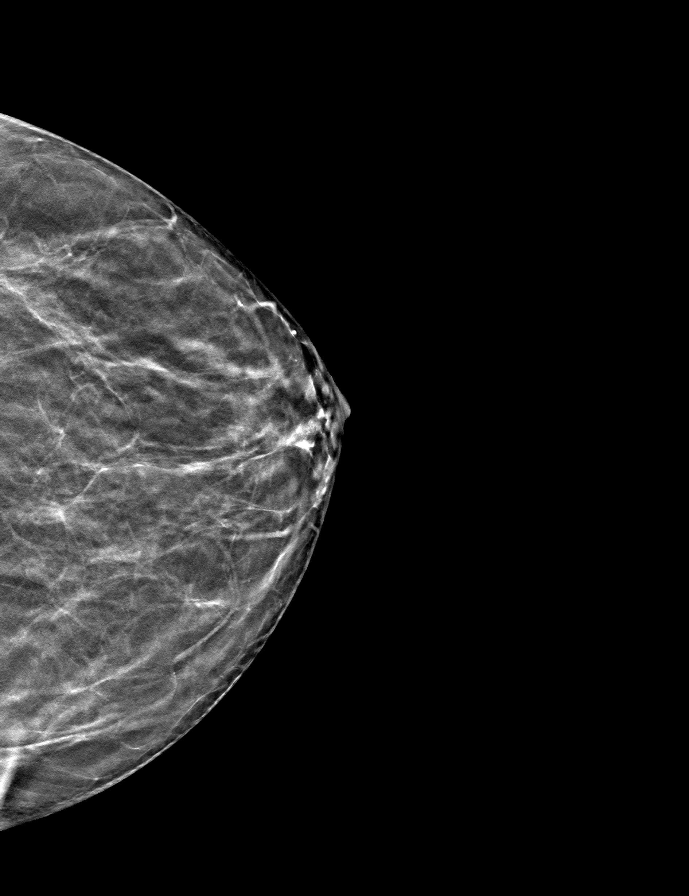

[R CC tomo · tomo slice 24/47.0]
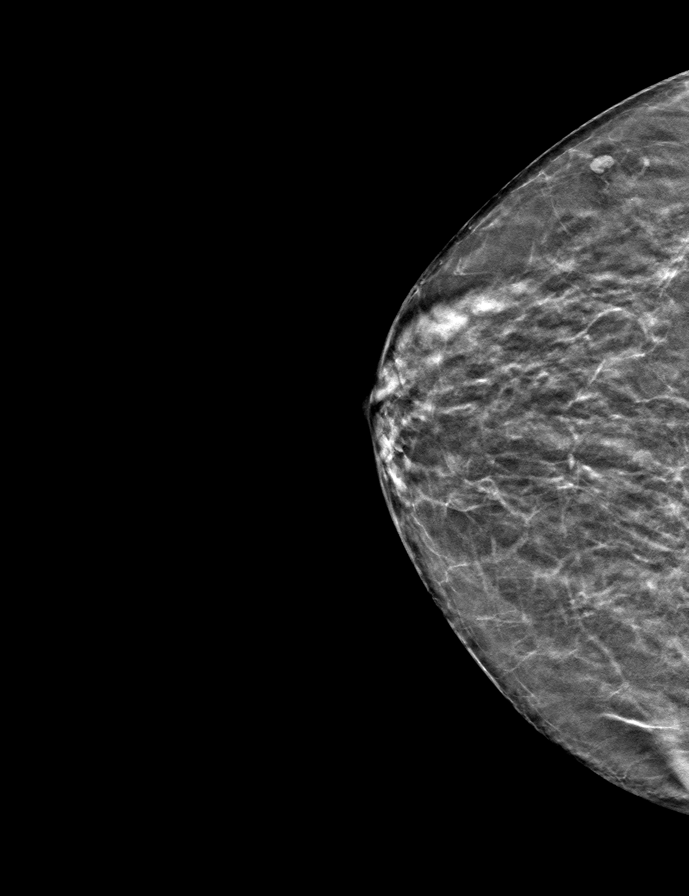

[R MLO tomo · tomo slice 27/53.0]
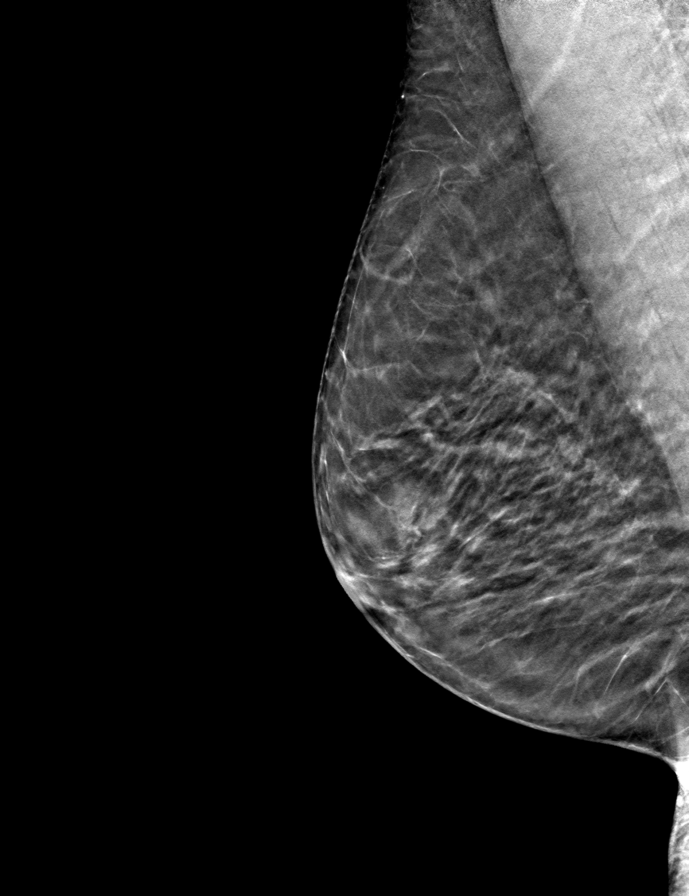

[9 of 24 positions shown; findings below may reference images not displayed]

ACR Breast Density Category b: There are scattered areas of
fibroglandular density.
FINDINGS: There are no findings suspicious for malignancy. Images were
processed with CAD.
IMPRESSION: No mammographic evidence of malignancy. A result letter of this
screening mammogram will be mailed directly to the patient.

RECOMMENDATION:
Screening mammogram in one year. (Code:[id number])

BI-RADS CATEGORY  1: Negative.

## 2020-08-17 ENCOUNTER — Other Ambulatory Visit: Payer: Self-pay

## 2020-08-17 ENCOUNTER — Encounter: Payer: Self-pay | Admitting: Adult Health

## 2020-08-17 ENCOUNTER — Ambulatory Visit (INDEPENDENT_AMBULATORY_CARE_PROVIDER_SITE_OTHER): Payer: PPO | Admitting: Adult Health

## 2020-08-17 VITALS — BP 112/64 | HR 76 | Temp 97.5°F | Ht 64.0 in | Wt 145.4 lb

## 2020-08-17 DIAGNOSIS — R7309 Other abnormal glucose: Secondary | ICD-10-CM | POA: Diagnosis not present

## 2020-08-17 DIAGNOSIS — Z1329 Encounter for screening for other suspected endocrine disorder: Secondary | ICD-10-CM | POA: Diagnosis not present

## 2020-08-17 DIAGNOSIS — E2839 Other primary ovarian failure: Secondary | ICD-10-CM

## 2020-08-17 DIAGNOSIS — Z136 Encounter for screening for cardiovascular disorders: Secondary | ICD-10-CM

## 2020-08-17 DIAGNOSIS — Z79899 Other long term (current) drug therapy: Secondary | ICD-10-CM

## 2020-08-17 DIAGNOSIS — D649 Anemia, unspecified: Secondary | ICD-10-CM

## 2020-08-17 DIAGNOSIS — Z1389 Encounter for screening for other disorder: Secondary | ICD-10-CM

## 2020-08-17 DIAGNOSIS — Z131 Encounter for screening for diabetes mellitus: Secondary | ICD-10-CM

## 2020-08-17 DIAGNOSIS — K219 Gastro-esophageal reflux disease without esophagitis: Secondary | ICD-10-CM

## 2020-08-17 DIAGNOSIS — E559 Vitamin D deficiency, unspecified: Secondary | ICD-10-CM

## 2020-08-17 DIAGNOSIS — E785 Hyperlipidemia, unspecified: Secondary | ICD-10-CM | POA: Diagnosis not present

## 2020-08-17 DIAGNOSIS — Z860100 Personal history of colon polyps, unspecified: Secondary | ICD-10-CM

## 2020-08-17 DIAGNOSIS — E538 Deficiency of other specified B group vitamins: Secondary | ICD-10-CM

## 2020-08-17 DIAGNOSIS — H409 Unspecified glaucoma: Secondary | ICD-10-CM

## 2020-08-17 DIAGNOSIS — I10 Essential (primary) hypertension: Secondary | ICD-10-CM | POA: Diagnosis not present

## 2020-08-17 DIAGNOSIS — D7589 Other specified diseases of blood and blood-forming organs: Secondary | ICD-10-CM

## 2020-08-17 DIAGNOSIS — Z0000 Encounter for general adult medical examination without abnormal findings: Secondary | ICD-10-CM

## 2020-08-17 DIAGNOSIS — J302 Other seasonal allergic rhinitis: Secondary | ICD-10-CM

## 2020-08-17 DIAGNOSIS — Z0001 Encounter for general adult medical examination with abnormal findings: Secondary | ICD-10-CM

## 2020-08-17 NOTE — Patient Instructions (Addendum)
  Ms. August , Thank you for taking time to come for your Annual Wellness Visit. I appreciate your ongoing commitment to your health goals. Please review the following plan we discussed and let me know if I can assist you in the future.   These are the goals we discussed:  Goals      LDL CALC < 130        This is a list of the screening recommended for you and due dates:  Health Maintenance  Topic Date Due   Zoster (Shingles) Vaccine (1 of 2) Never done   COVID-19 Vaccine (3 - Pfizer risk series) 03/13/2020   Flu Shot  08/14/2020   Hepatitis C Screening: USPSTF Recommendation to screen - Ages 18-79 yo.  02/15/2021*   Pneumonia vaccines (1 of 2 - PCV13) 02/15/2021*   Tetanus Vaccine  07/01/2022   DEXA scan (bone density measurement)  Completed   HPV Vaccine  Aged Out  *Topic was postponed. The date shown is not the original due date.    Please send shingrix vaccine information  Please schedule bone density (DEXA) with next mammogram at breast center   [facility]  7 a.m.-6:30 p.m., Monday 7 a.m.-5 p.m., Tuesday-Friday Schedule an appointment by calling [phone].    Know what a healthy weight is for you (roughly BMI <25) and aim to maintain this  Aim for 7+ servings of fruits and vegetables daily  65-80+ fluid ounces of water or unsweet tea for healthy kidneys  Limit to max 1 drink of alcohol per day; avoid smoking/tobacco  Limit animal fats in diet for cholesterol and heart health - choose grass fed whenever available  Avoid highly processed foods, and foods high in saturated/trans fats  Aim for low stress - take time to unwind and care for your mental health  Aim for 150 min of moderate intensity exercise weekly for heart health, and weights twice weekly for bone health  Aim for 7-9 hours of sleep dai

## 2020-08-17 NOTE — Progress Notes (Signed)
CPE   Assessment:    Encounter for Annual Physical Exam with abnormal findings Due annually  Health Maintenance reviewed Healthy lifestyle reviewed and goals set Schedule DEXA with mammogram  Forward shingrix record  Hyperlipidemia, unspecified hyperlipidemia type Strong patient preference to avoid meds, admits could do better with diet/lifestyle, motivated to work on this Cntinue to discuss lifestyle changes - low saturated fat, high fiber, primarily plant based minimally processed Mediterranean type diet encouraged Consider fiber supplement  -     Lipid Profile  Essential hypertension -     CBC with Diff -     COMPLETE METABOLIC PANEL WITH GFR -     TSH - continue medications, DASH diet, exercise and monitor at home. Call if greater than 130/80.   Medication management -     Magnesium  Other abnormal glucose -     Hemoglobin A1c ([facility]) Discussed disease progression and risks Discussed diet/exercise, weight management and risk modification  Vitamin D deficiency -     Vitamin D (25 hydroxy)  Anemia, unspecified type monitor CBC, normal iron, colonoscopy B12 (last borderline)  Glaucoma, unspecified glaucoma type, unspecified laterality Continue follow up eye doctor  History of colon polyps Had colonoscopy 03/2020, none further recommended High fiber diet recommended  Gastroesophageal reflux disease, unspecified whether esophagitis present Well managed on current medications Discussed diet, avoiding triggers and other lifestyle change   Orders Placed This Encounter  Procedures   DG Bone Density   CBC with Differential/Platelet   COMPLETE METABOLIC PANEL WITH GFR   Magnesium   Lipid panel   TSH   Hemoglobin A1c   VITAMIN D 25 Hydroxy (Vit-D Deficiency, Fractures)   Microalbumin / creatinine urine ratio   Urinalysis, Routine w reflex microscopic   Vitamin B12   EKG 12-Lead    Future Appointments  Date Time Provider [place]  02/15/2021  9:30  AM Liane Comber, NP [facility] None     Subjective:   Robin Zimmerman is a non smoking 80 y.o. female who presents for CPE and 3 month follow up for HTN, chol, glucose management, vitamin D.   She has GERD, was having persistent throat clearing, improved on famotidine.  BMI is Body mass index is 24.96 kg/m., she has been working on diet and exercise, admits less exercise with heat, considering to get exercise machine.  Wt Readings from Last 3 Encounters:  08/17/20 145 lb 6.4 oz (66 kg)  02/16/20 143 lb (64.9 kg)  10/18/19 148 lb (67.1 kg)   Her blood pressure has been controlled at home, today their BP is BP: 112/64 She does not workout, very sporadic.  She denies chest pain, shortness of breath, dizziness.    She is not on cholesterol medication and denies myalgias. Her cholesterol is not at goal. Her mother had hx of CVA in her 50s, patient does not have other risk factors and patient strongly prefers to avoid medications. The cholesterol last visit was:   Lab Results  Component Value Date   CHOL 258 (H) 02/16/2020   HDL 86 02/16/2020   LDLCALC 156 (H) 02/16/2020   TRIG 61 02/16/2020   CHOLHDL 3.0 02/16/2020   She has been working on diet and exercise for glucose management, and denies polydipsia, polyuria and visual disturbances. Last A1C in the office was:  Lab Results  Component Value Date   HGBA1C 5.5 01/13/2019   Patient is on Vitamin D supplement, 10000 IU daily. Lab Results  Component Value Date   VD25OH 31  02/16/2020   She has stable anemia, hgb in 11 range, predates 2015;   CBC Latest Ref Rng & Units 02/16/2020 10/18/2019 07/09/2019  WBC 3.8 - 10.8 Thousand/uL 2.7(L) 3.3(L) 2.8(L)  Hemoglobin 11.7 - 15.5 g/dL 11.3(L) 11.4(L) 11.3(L)  Hematocrit 35.0 - 45.0 % 34.3(L) 35.1 34.6(L)  Platelets 140 - 400 Thousand/uL 249 240 231   Lab Results  Component Value Date   IRON 77 02/16/2020   TIBC 252 02/16/2020   FERRITIN 206 02/16/2020   B12 has been low,  patient is taking daily supplement, switched to 500 mcg SL Lab Results  Component Value Date   [id number] 02/16/2020      Medication Review     Current Outpatient Medications (Analgesics):    meloxicam (MOBIC) 15 MG tablet, Take one daily with food for 2 weeks, can take with tylenol, can not take with aleve, iburpofen, then as needed daily for pain  Current Outpatient Medications (Hematological):    Cyanocobalamin (B-12) 500 MCG TABS, Take by mouth daily.   IRON PO, Take by mouth daily.  Current Outpatient Medications (Other):    Cholecalciferol (VITAMIN D PO), Take 5,000 Int'l Units by mouth daily.   famotidine (PEPCID) 40 MG tablet, Take 1 tablet (40 mg total) by mouth every evening.   latanoprost (XALATAN) 0.005 % ophthalmic solution,    Magnesium 250 MG TABS, Take by mouth daily.  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   B12 deficiency 02/17/2020   Seasonal allergies 10/18/2019   Absolute anemia 07/20/2014   Glaucoma 04/14/2014   Vitamin D deficiency 04/04/2013   Medication management 04/04/2013   Other abnormal glucose 01/19/2013   GERD (gastroesophageal reflux disease)    Hyperlipidemia    Hypertension    History of colon polyps 02/16/2007    Screening Tests Immunization History  Administered Date(s) Administered   PFIZER Comirnaty(Gray Top)Covid-19 Tri-Sucrose Vaccine 04/23/2019, 05/18/2019   PFIZER SARS-COV-2 Pediatric Vaccination 5-86yr 02/14/2020   Td 06/30/2012    Preventative care: Last colonoscopy: 03/22/2020 - 1 polyp remoted, DONE Last mammogram: 10/29/2019 Last pap smear/pelvic exam: 2012  remote DEXA: 2017 -0.9 normal- will follow up DEXA this year, ordered  Prior vaccinations: TD or Tdap: 2014 Influenza: declines Prevnar 13 Declines Pneumococcal: declines Shingrix: reports has 2/2- report requested Covid 19: 3/3, pfizer  Names of Other Physician/Practitioners you  currently use: 1. [facility]- here for primary care 2. [place]. ?[place] [facility], last visit 2021, mild L cataract 3. Dr. ? , dental, last visit - last 2022  Patient Care Team: MUnk Pinto MD as PCP - General (Internal Medicine) MJuanita Craver MD as Consulting Physician (Gastroenterology)   Allergies Allergies  Allergen Reactions   Prednisone Other (See Comments)    Hallucinations    SURGICAL HISTORY She  has a past surgical history that includes Tubal ligation; Cholecystectomy; and Cataract extraction (Right, 06/16/2017). FAMILY HISTORY Her family history includes Alzheimer's disease in her father; Bone cancer in her brother; Brain cancer in her sister; Cancer in her sister; Colon cancer in her brother and sister; Dementia (age of onset: 952 in her sister; Diabetes in her sister; Heart disease in her mother; Heart disease (age of onset: 426 in her brother; Prostate cancer in her brother; Throat cancer in her brother. SOCIAL HISTORY She  reports that she quit smoking about 55 years ago. Her smoking use included cigarettes. She started smoking about 60 years ago. She has  a 1.25 pack-year smoking history. She has never used smokeless tobacco. She reports that she does not drink alcohol and does not use drugs.  Review of Systems  Constitutional:  Negative for malaise/fatigue and weight loss.  HENT:  Negative for hearing loss and tinnitus.   Eyes:  Negative for blurred vision and double vision.  Respiratory:  Negative for cough, shortness of breath and wheezing.   Cardiovascular:  Negative for chest pain, palpitations, orthopnea, claudication and leg swelling.  Gastrointestinal:  Negative for abdominal pain, blood in stool, constipation, diarrhea, heartburn, melena, nausea and vomiting.  Genitourinary: Negative.        Stress incontinence  Musculoskeletal:  Negative for falls, joint pain and myalgias.  Skin:  Negative for rash.   Neurological:  Negative for dizziness, tingling, sensory change, weakness and headaches.  Endo/Heme/Allergies:  Negative for polydipsia.  Psychiatric/Behavioral: Negative.    All other systems reviewed and are negative.    Objective:     Blood pressure 112/64, pulse 76, temperature (!) 97.5 F (36.4 C), height '5\' 4"'$  (1.626 m), weight 145 lb 6.4 oz (66 kg), SpO2 98 %. Body mass index is 24.96 kg/m.  General appearance: alert, no distress, WD/WN,  female HEENT: normocephalic, sclerae anicteric, TMs pearly, nares patent, no discharge or erythema, pharynx normal Oral cavity: MMM, no lesions Neck: supple, no lymphadenopathy, no thyromegaly, no masses Heart: RRR, normal S1, S2, no murmurs Lungs: CTA bilaterally, no wheezes, rhonchi, or rales Abdomen: +bs, soft, non-tender, non distended, no rebound, no masses, no hepatomegaly, no splenomegaly Musculoskeletal: full ROM, no swelling, no obvious deformity. Lateral tenderness over trochanter and surrounding musculature Extremities: no edema, no cyanosis, no clubbing Skin: Seb keratosis over back and AB- unchanged  Pulses: 2+ symmetric, upper and lower extremities, normal cap refill Neurological: alert, oriented x 3, CN2-12 intact, strength normal upper extremities and lower extremities, sensation normal throughout, DTRs 2+ throughout, no cerebellar signs, gait normal Psychiatric: normal affect, behavior normal, pleasant  Breasts: breasts appear normal, no suspicious masses, no skin or nipple changes or axillary nodes. GU: defer  EKG: NSR    Izora Ribas, NP   08/17/2020

## 2020-08-18 LAB — HEMOGLOBIN A1C
Hgb A1c MFr Bld: 5.5 % of total Hgb (ref <5.7)
Mean Plasma Glucose: 111 mg/dL
eAG (mmol/L): 6.2 mmol/L

## 2020-08-18 LAB — COMPLETE METABOLIC PANEL WITH GFR
AG Ratio: 1.4 (calc) (ref 1.0–2.5)
ALT: 7 U/L (ref 6–29)
AST: 14 U/L (ref 10–35)
Albumin: 4.2 g/dL (ref 3.6–5.1)
Alkaline phosphatase (APISO): 61 U/L (ref 37–153)
BUN: 14 mg/dL (ref 7–25)
CO2: 31 mmol/L (ref 20–32)
Calcium: 9.4 mg/dL (ref 8.6–10.4)
Chloride: 103 mmol/L (ref 98–110)
Creat: 0.93 mg/dL (ref 0.60–1.00)
Globulin: 2.9 g/dL (calc) (ref 1.9–3.7)
Glucose, Bld: 82 mg/dL (ref 65–99)
Potassium: 4.3 mmol/L (ref 3.5–5.3)
Sodium: 141 mmol/L (ref 135–146)
Total Bilirubin: 0.5 mg/dL (ref 0.2–1.2)
Total Protein: 7.1 g/dL (ref 6.1–8.1)
eGFR: 63 mL/min/{1.73_m2} (ref >=60)

## 2020-08-18 LAB — VITAMIN B12: Vitamin B-12: 1394 pg/mL — ABNORMAL HIGH (ref 200–1100)

## 2020-08-18 LAB — CBC WITH DIFFERENTIAL/PLATELET
Absolute Monocytes: 281 cells/uL (ref 200–950)
Basophils Absolute: 20 cells/uL (ref 0–200)
Basophils Relative: 0.6 %
Eosinophils Absolute: 59 cells/uL (ref 15–500)
Eosinophils Relative: 1.8 %
HCT: 35.5 % (ref 35.0–45.0)
Hemoglobin: 11.3 g/dL — ABNORMAL LOW (ref 11.7–15.5)
Lymphs Abs: 1129 cells/uL (ref 850–3900)
MCH: 29.4 pg (ref 27.0–33.0)
MCHC: 31.8 g/dL — ABNORMAL LOW (ref 32.0–36.0)
MCV: 92.2 fL (ref 80.0–100.0)
MPV: 9.7 fL (ref 7.5–12.5)
Monocytes Relative: 8.5 %
Neutro Abs: 1812 cells/uL (ref 1500–7800)
Neutrophils Relative %: 54.9 %
Platelets: 236 10*3/uL (ref 140–400)
RBC: 3.85 10*6/uL (ref 3.80–5.10)
RDW: 12.2 % (ref 11.0–15.0)
Total Lymphocyte: 34.2 %
WBC: 3.3 10*3/uL — ABNORMAL LOW (ref 3.8–10.8)

## 2020-08-18 LAB — URINALYSIS, ROUTINE W REFLEX MICROSCOPIC
Bilirubin Urine: NEGATIVE
Glucose, UA: NEGATIVE
Hgb urine dipstick: NEGATIVE
Hyaline Cast: NONE SEEN /LPF
Ketones, ur: NEGATIVE
Nitrite: NEGATIVE
Protein, ur: NEGATIVE
Specific Gravity, Urine: 1.019 (ref 1.001–1.035)
pH: 5.5 (ref 5.0–8.0)

## 2020-08-18 LAB — MICROALBUMIN / CREATININE URINE RATIO
Creatinine, Urine: 129 mg/dL (ref 20–275)
Microalb Creat Ratio: 2 mcg/mg creat (ref <30)
Microalb, Ur: 0.2 mg/dL

## 2020-08-18 LAB — MICROSCOPIC MESSAGE

## 2020-08-18 LAB — LIPID PANEL
Cholesterol: 245 mg/dL — ABNORMAL HIGH (ref <200)
HDL: 95 mg/dL (ref >=50)
LDL Cholesterol (Calc): 135 mg/dL (calc) — ABNORMAL HIGH
Non-HDL Cholesterol (Calc): 150 mg/dL (calc) — ABNORMAL HIGH (ref <130)
Total CHOL/HDL Ratio: 2.6 (calc) (ref <5.0)
Triglycerides: 61 mg/dL (ref <150)

## 2020-08-18 LAB — VITAMIN D 25 HYDROXY (VIT D DEFICIENCY, FRACTURES): Vit D, 25-Hydroxy: 38 ng/mL (ref 30–100)

## 2020-08-18 LAB — TSH: TSH: 0.4 mIU/L (ref 0.40–4.50)

## 2020-08-18 LAB — MAGNESIUM: Magnesium: 2.4 mg/dL (ref 1.5–2.5)

## 2020-12-05 DIAGNOSIS — H40003 Preglaucoma, unspecified, bilateral: Secondary | ICD-10-CM | POA: Diagnosis not present

## 2021-02-14 ENCOUNTER — Encounter: Payer: Self-pay | Admitting: Adult Health

## 2021-02-14 ENCOUNTER — Ambulatory Visit
Admission: RE | Admit: 2021-02-14 | Discharge: 2021-02-14 | Disposition: A | Discharge status: Home / Self Care | Payer: PPO | Source: Ambulatory Visit | Attending: Adult Health | Admitting: Adult Health

## 2021-02-14 DIAGNOSIS — E2839 Other primary ovarian failure: Secondary | ICD-10-CM

## 2021-02-14 DIAGNOSIS — M8588 Other specified disorders of bone density and structure, other site: Secondary | ICD-10-CM | POA: Diagnosis not present

## 2021-02-14 DIAGNOSIS — M8580 Other specified disorders of bone density and structure, unspecified site: Secondary | ICD-10-CM | POA: Insufficient documentation

## 2021-02-14 DIAGNOSIS — Z780 Asymptomatic menopausal state: Secondary | ICD-10-CM | POA: Diagnosis not present

## 2021-02-14 NOTE — Progress Notes (Signed)
AWV and 6 month follow up  Assessment:    Annual Medicare Wellness Visit Due annually  Health maintenance reviewed  Schedule mammogram  Forward shingrix record  Hyperlipidemia, unspecified hyperlipidemia type Strong patient preference to avoid meds, has made dietary changes Cntinue to discuss lifestyle changes - low saturated fat, high fiber, primarily plant based minimally processed Mediterranean type diet encouraged Consider fiber supplement  -     Lipid Profile  Essential hypertension -     CBC with Diff -     COMPLETE METABOLIC PANEL WITH GFR -     TSH - continue medications, DASH diet, exercise and monitor at home. Call if greater than 130/80.   Medication management -     Magnesium  Other abnormal glucose -     Hemoglobin A1c ([facility]) - defer, last was normal  Discussed disease progression and risks Discussed diet/exercise, weight management and risk modification  Vitamin D deficiency -     Vitamin D (25 hydroxy)  Anemia, unspecified type monitor CBC, normal iron, colonoscopy  Glaucoma, unspecified glaucoma type, unspecified laterality Continue follow up eye doctor  History of colon polyps Had colonoscopy 03/2020, none further recommended High fiber diet recommended  Gastroesophageal reflux disease, unspecified whether esophagitis present Well managed on current medications Discussed diet, avoiding triggers and other lifestyle change  B12 def Slightly above goal - reduce from daily to 5 days week    Orders Placed This Encounter  Procedures   CBC with Differential/Platelet   COMPLETE METABOLIC PANEL WITH GFR   Magnesium   Lipid panel   TSH   VITAMIN D 25 Hydroxy (Vit-D Deficiency, Fractures)    Future Appointments  Date Time Provider [place]  05/17/2021  9:30 AM Unk Pinto, MD [facility] None  08/17/2021  9:00 AM Magda Bernheim, NP [facility] None  02/15/2022  9:00 AM Liane Comber, NP [facility] None    Plan:   During the course  of the visit the patient was educated and counseled about appropriate screening and preventive services including:   Pneumococcal vaccine  Prevnar 13 Influenza vaccine Td vaccine Screening electrocardiogram Bone densitometry screening Colorectal cancer screening Diabetes screening Glaucoma screening Nutrition counseling  Advanced directives: requested   Subjective:   Robin Zimmerman is a non smoking 81 y.o. female who presents for AWV and 3 month follow up. She has GERD (gastroesophageal reflux disease); Hyperlipidemia; Hypertension; Other abnormal glucose; Vitamin D deficiency; Medication management; History of colon polyps; Glaucoma; Absolute anemia; Seasonal allergies; Bicytopenia; B12 deficiency; and Osteopenia on their problem list.   She has GERD, was having persistent throat clearing, improved on famotidine.  BMI is Body mass index is 24.72 kg/m., she has been working on diet and exercise, admits less exercise with weather, considering to get exercise machine or join a gym.  Wt Readings from Last 3 Encounters:  02/15/21 144 lb (65.3 kg)  08/17/20 145 lb 6.4 oz (66 kg)  02/16/20 143 lb (64.9 kg)   Her blood pressure has been controlled at home, today their BP is BP: 110/64 She does not workout, very sporadic.  She denies chest pain, shortness of breath, dizziness.    She is not on cholesterol medication and denies myalgias. Her cholesterol is not at goal. Her mother had hx of CVA in her 65s, patient does not have other risk factors and patient strongly prefers to avoid medications. The cholesterol last visit was:   Lab Results  Component Value Date   CHOL 245 (H) 08/17/2020   HDL  95 08/17/2020   LDLCALC 135 (H) 08/17/2020   TRIG 61 08/17/2020   CHOLHDL 2.6 08/17/2020   She has been working on diet and exercise for glucose management, and denies polydipsia, polyuria and visual disturbances. Last A1C in the office was:  Lab Results  Component Value Date   HGBA1C  5.5 08/17/2020   Patient is on Vitamin D supplement, increased from 5000 IU - 10000 IU daily. Lab Results  Component Value Date   VD25OH 38 08/17/2020   She has stable anemia, hgb in 11 range, predates 2015;   CBC Latest Ref Rng & Units 08/17/2020 02/16/2020 10/18/2019  WBC 3.8 - 10.8 Thousand/uL 3.3(L) 2.7(L) 3.3(L)  Hemoglobin 11.7 - 15.5 g/dL 11.3(L) 11.3(L) 11.4(L)  Hematocrit 35.0 - 45.0 % 35.5 34.3(L) 35.1  Platelets 140 - 400 Thousand/uL 236 249 240   Lab Results  Component Value Date   IRON 77 02/16/2020   TIBC 252 02/16/2020   FERRITIN 206 02/16/2020   B12 has been low, patient is taking daily supplement, switched to 500 mcg SL Lab Results  Component Value Date   VITAMINB12 1,394 (H) 08/17/2020      Medication Review      Current Outpatient Medications (Hematological):    IRON PO, Take by mouth daily.   Cyanocobalamin (B-12) 500 MCG TABS, Take by mouth daily.  Current Outpatient Medications (Other):    Magnesium 250 MG TABS, Take by mouth daily.   Cholecalciferol (VITAMIN D PO), Take 5,000 Int'l Units by mouth daily.   famotidine (PEPCID) 40 MG tablet, Take 1 tablet (40 mg total) by mouth every evening.  Current Problems (verified) Patient Active Problem List   Diagnosis Date Noted   Osteopenia 02/14/2021   Bicytopenia 02/17/2020   B12 deficiency 02/17/2020   Seasonal allergies 10/18/2019   Absolute anemia 07/20/2014   Glaucoma 04/14/2014   Vitamin D deficiency 04/04/2013   Medication management 04/04/2013   Other abnormal glucose 01/19/2013   GERD (gastroesophageal reflux disease)    Hyperlipidemia    Hypertension    History of colon polyps 02/16/2007    Screening Tests Immunization History  Administered Date(s) Administered   PFIZER Comirnaty(Gray Top)Covid-19 Tri-Sucrose Vaccine 04/23/2019, 05/18/2019   PFIZER SARS-COV-2 Pediatric Vaccination 5-37yrs 02/14/2020   Td 06/30/2012   Health Maintenance  Topic Date Due   Zoster Vaccines- Shingrix  (1 of 2) Never done   COVID-19 Vaccine (3 - Pfizer risk series) 03/13/2020   INFLUENZA VACCINE  04/13/2021 (Originally 08/14/2020)   Pneumonia Vaccine 27+ Years old (1 - PCV) 02/15/2022 (Originally 10/18/1946)   TETANUS/TDAP  07/01/2022   DEXA SCAN  02/15/2023   HPV VACCINES  Aged Out   Last colonoscopy: 03/22/2020 - 1 polyp remoted, DONE Last mammogram: breast center 10/29/2019 encouraged to schedule -  Last pap smear/pelvic exam: 2012  remote DEXA: DEXA 02/14/2021 lumbar T-1.1  Shingrix: reports has 2/2- report requested   Names of Other Physician/Practitioners you currently use: 1. [facility]- here for primary care 2. Dr. Olin Hauser, eye doctor, last visit 2022, mild L cataract 3. Dr. ? , dental, last visit - last 2022  Patient Care Team: Unk Pinto, MD as PCP - General (Internal Medicine) Juanita Craver, MD as Consulting Physician (Gastroenterology)   Allergies Allergies  Allergen Reactions   Prednisone Other (See Comments)    Hallucinations    SURGICAL HISTORY She  has a past surgical history that includes Tubal ligation; Cholecystectomy; and Cataract extraction (Right, 06/16/2017). FAMILY HISTORY Her family history  includes Alzheimer's disease in her father; Bone cancer in her brother; Brain cancer in her sister; Cancer in her sister; Colon cancer in her brother and sister; Dementia (age of onset: 58) in her sister; Diabetes in her sister; Heart disease in her mother; Heart disease (age of onset: 48) in her brother; Prostate cancer in her brother; Throat cancer in her brother. SOCIAL HISTORY She  reports that she quit smoking about 56 years ago. Her smoking use included cigarettes. She started smoking about 61 years ago. She has a 1.25 pack-year smoking history. She has never used smokeless tobacco. She reports that she does not drink alcohol and does not use drugs.  MEDICARE WELLNESS OBJECTIVES: Physical activity: Current Exercise  Habits: Home exercise routine, Type of exercise: walking, Time (Minutes): 10, Frequency (Times/Week): 4, Weekly Exercise (Minutes/Week): 40, Intensity: Mild, Exercise limited by: None identified Cardiac risk factors: Cardiac Risk Factors include: advanced age (>49men, >56 women);dyslipidemia;hypertension;smoking/ tobacco exposure Depression/mood screen:   Depression screen [facility] 2/9 02/15/2021  Decreased Interest 0  Down, Depressed, Hopeless 0  PHQ - 2 Score 0    ADLs:  In your present state of health, do you have any difficulty performing the following activities: 02/15/2021 02/16/2020  Hearing? N N  Vision? N N  Difficulty concentrating or making decisions? N N  Walking or climbing stairs? N N  Dressing or bathing? N N  Doing errands, shopping? N N  Some recent data might be hidden     Cognitive Testing  Alert? Yes  Normal Appearance?Yes  Oriented to person? Yes  Place? Yes   Time? Yes  Recall of three objects?  Yes  Can perform simple calculations? Yes  Displays appropriate judgment?Yes  Can read the correct time from a watch face?Yes  EOL planning: Does Patient Have a Medical Advance Directive?: Yes Type of Advance Directive: Healthcare Power of Attorney, Living will Does patient want to make changes to medical advance directive?: No - Patient declined Copy of [place] in Chart?: No - copy requested Would patient like information on creating a medical advance directive?: No - Patient declined   Review of Systems  Constitutional:  Negative for malaise/fatigue and weight loss.  HENT:  Negative for hearing loss and tinnitus.   Eyes:  Negative for blurred vision and double vision.  Respiratory:  Negative for cough, shortness of breath and wheezing.   Cardiovascular:  Negative for chest pain, palpitations, orthopnea, claudication and leg swelling.  Gastrointestinal:  Negative for abdominal pain, blood in stool, constipation, diarrhea, heartburn, melena, nausea and  vomiting.  Genitourinary: Negative.        Stress incontinence  Musculoskeletal:  Negative for falls, joint pain and myalgias.  Skin:  Negative for rash.  Neurological:  Negative for dizziness, tingling, sensory change, weakness and headaches.  Endo/Heme/Allergies:  Negative for polydipsia.  Psychiatric/Behavioral: Negative.    All other systems reviewed and are negative.    Objective:     Blood pressure 110/64, pulse 74, temperature (!) 97.5 F (36.4 C), weight 144 lb (65.3 kg), SpO2 99 %. Body mass index is 24.72 kg/m.  General appearance: alert, no distress, WD/WN,  female HEENT: normocephalic, sclerae anicteric, TMs pearly, nares patent, no discharge or erythema, pharynx normal Oral cavity: MMM, no lesions Neck: supple, no lymphadenopathy, no thyromegaly, no masses Heart: RRR, normal S1, S2, no murmurs Lungs: CTA bilaterally, no wheezes, rhonchi, or rales Abdomen: +bs, soft, non-tender, non distended, no rebound, no masses, no hepatomegaly, no splenomegaly Musculoskeletal: full ROM, no  swelling, no obvious deformity. Lateral tenderness over trochanter and surrounding musculature Extremities: no edema, no cyanosis, no clubbing Skin: Seb keratosis over back and AB- unchanged  Pulses: 2+ symmetric, upper and lower extremities, normal cap refill Neurological: alert, oriented x 3, CN2-12 intact, strength normal upper extremities and lower extremities, sensation normal throughout, DTRs 2+ throughout, no cerebellar signs, gait normal Psychiatric: normal affect, behavior normal, pleasant    Medicare Attestation I have personally reviewed: The patient's medical and social history Their use of alcohol, tobacco or illicit drugs Their current medications and supplements The patient's functional ability including ADLs,fall risks, home safety risks, cognitive, and hearing and visual impairment Diet and physical activities Evidence for depression or mood disorders  The patient's  weight, height, BMI, and visual acuity have been recorded in the chart.  I have made referrals, counseling, and provided education to the patient based on review of the above and I have provided the patient with a written personalized care plan for preventive services.     Izora Ribas, NP   02/15/2021

## 2021-02-15 ENCOUNTER — Encounter: Payer: Self-pay | Admitting: Adult Health

## 2021-02-15 ENCOUNTER — Ambulatory Visit (INDEPENDENT_AMBULATORY_CARE_PROVIDER_SITE_OTHER): Payer: PPO | Admitting: Adult Health

## 2021-02-15 ENCOUNTER — Other Ambulatory Visit: Payer: Self-pay

## 2021-02-15 VITALS — BP 110/64 | HR 74 | Temp 97.5°F | Wt 144.0 lb

## 2021-02-15 DIAGNOSIS — R0989 Other specified symptoms and signs involving the circulatory and respiratory systems: Secondary | ICD-10-CM

## 2021-02-15 DIAGNOSIS — J302 Other seasonal allergic rhinitis: Secondary | ICD-10-CM

## 2021-02-15 DIAGNOSIS — E559 Vitamin D deficiency, unspecified: Secondary | ICD-10-CM

## 2021-02-15 DIAGNOSIS — R7309 Other abnormal glucose: Secondary | ICD-10-CM | POA: Diagnosis not present

## 2021-02-15 DIAGNOSIS — Z79899 Other long term (current) drug therapy: Secondary | ICD-10-CM | POA: Diagnosis not present

## 2021-02-15 DIAGNOSIS — H409 Unspecified glaucoma: Secondary | ICD-10-CM | POA: Diagnosis not present

## 2021-02-15 DIAGNOSIS — Z0001 Encounter for general adult medical examination with abnormal findings: Secondary | ICD-10-CM | POA: Diagnosis not present

## 2021-02-15 DIAGNOSIS — E785 Hyperlipidemia, unspecified: Secondary | ICD-10-CM

## 2021-02-15 DIAGNOSIS — K219 Gastro-esophageal reflux disease without esophagitis: Secondary | ICD-10-CM

## 2021-02-15 DIAGNOSIS — Z860100 Personal history of colon polyps, unspecified: Secondary | ICD-10-CM

## 2021-02-15 DIAGNOSIS — E538 Deficiency of other specified B group vitamins: Secondary | ICD-10-CM | POA: Diagnosis not present

## 2021-02-15 DIAGNOSIS — I10 Essential (primary) hypertension: Secondary | ICD-10-CM | POA: Diagnosis not present

## 2021-02-15 DIAGNOSIS — Z0000 Encounter for general adult medical examination without abnormal findings: Secondary | ICD-10-CM

## 2021-02-15 DIAGNOSIS — R6889 Other general symptoms and signs: Secondary | ICD-10-CM | POA: Diagnosis not present

## 2021-02-15 DIAGNOSIS — D649 Anemia, unspecified: Secondary | ICD-10-CM

## 2021-02-15 DIAGNOSIS — Z6824 Body mass index (BMI) 24.0-24.9, adult: Secondary | ICD-10-CM | POA: Diagnosis not present

## 2021-02-15 DIAGNOSIS — M8580 Other specified disorders of bone density and structure, unspecified site: Secondary | ICD-10-CM | POA: Diagnosis not present

## 2021-02-15 MED ORDER — FAMOTIDINE 40 MG PO TABS: 40.0000 mg | ORAL_TABLET | Freq: Every evening | ORAL | 3 refills | Status: DC

## 2021-02-15 NOTE — Patient Instructions (Addendum)
°  Ms. Robin Zimmerman , Thank you for taking time to come for your Medicare Wellness Visit. I appreciate your ongoing commitment to your health goals. Please review the following plan we discussed and let me know if I can assist you in the future.   These are the goals we discussed:  Goals      LDL CALC < 130        This is a list of the screening recommended for you and due dates:  Health Maintenance  Topic Date Due   Zoster (Shingles) Vaccine (1 of 2) Never done   COVID-19 Vaccine (3 - Pfizer risk series) 03/13/2020   Flu Shot  04/13/2021*   Pneumonia Vaccine (1 - PCV) 02/15/2022*   Tetanus Vaccine  07/01/2022   DEXA scan (bone density measurement)  02/15/2023   HPV Vaccine  Aged Out  *Topic was postponed. The date shown is not the original due date.     If the famotidine doesn't fully resolve your throat clearing add a daily allergy pill in about 2 weeks and - allegra, claritin, zyrtec   Please have your shingles vaccine dates ready at home to give to the nurse over the phone

## 2021-02-16 LAB — CBC WITH DIFFERENTIAL/PLATELET
Absolute Monocytes: 218 cells/uL (ref 200–950)
Basophils Absolute: 20 cells/uL (ref 0–200)
Basophils Relative: 0.6 %
Eosinophils Absolute: 59 cells/uL (ref 15–500)
Eosinophils Relative: 1.8 %
HCT: 35.5 % (ref 35.0–45.0)
Hemoglobin: 11.9 g/dL (ref 11.7–15.5)
Lymphs Abs: 997 cells/uL (ref 850–3900)
MCH: 30.4 pg (ref 27.0–33.0)
MCHC: 33.5 g/dL (ref 32.0–36.0)
MCV: 90.8 fL (ref 80.0–100.0)
MPV: 10.3 fL (ref 7.5–12.5)
Monocytes Relative: 6.6 %
Neutro Abs: 2006 cells/uL (ref 1500–7800)
Neutrophils Relative %: 60.8 %
Platelets: 251 10*3/uL (ref 140–400)
RBC: 3.91 10*6/uL (ref 3.80–5.10)
RDW: 12 % (ref 11.0–15.0)
Total Lymphocyte: 30.2 %
WBC: 3.3 10*3/uL — ABNORMAL LOW (ref 3.8–10.8)

## 2021-02-16 LAB — MAGNESIUM: Magnesium: 2.3 mg/dL (ref 1.5–2.5)

## 2021-02-16 LAB — COMPLETE METABOLIC PANEL WITH GFR
AG Ratio: 1.3 (calc) (ref 1.0–2.5)
ALT: 8 U/L (ref 6–29)
AST: 18 U/L (ref 10–35)
Albumin: 4.2 g/dL (ref 3.6–5.1)
Alkaline phosphatase (APISO): 60 U/L (ref 37–153)
BUN/Creatinine Ratio: 13 (calc) (ref 6–22)
BUN: 14 mg/dL (ref 7–25)
CO2: 31 mmol/L (ref 20–32)
Calcium: 9.5 mg/dL (ref 8.6–10.4)
Chloride: 102 mmol/L (ref 98–110)
Creat: 1.05 mg/dL — ABNORMAL HIGH (ref 0.60–0.95)
Globulin: 3.3 g/dL (calc) (ref 1.9–3.7)
Glucose, Bld: 89 mg/dL (ref 65–99)
Potassium: 4.3 mmol/L (ref 3.5–5.3)
Sodium: 140 mmol/L (ref 135–146)
Total Bilirubin: 0.7 mg/dL (ref 0.2–1.2)
Total Protein: 7.5 g/dL (ref 6.1–8.1)
eGFR: 54 mL/min/{1.73_m2} — ABNORMAL LOW (ref >=60)

## 2021-02-16 LAB — LIPID PANEL
Cholesterol: 230 mg/dL — ABNORMAL HIGH (ref <200)
HDL: 83 mg/dL (ref >=50)
LDL Cholesterol (Calc): 129 mg/dL (calc) — ABNORMAL HIGH
Non-HDL Cholesterol (Calc): 147 mg/dL (calc) — ABNORMAL HIGH (ref <130)
Total CHOL/HDL Ratio: 2.8 (calc) (ref <5.0)
Triglycerides: 82 mg/dL (ref <150)

## 2021-02-16 LAB — VITAMIN D 25 HYDROXY (VIT D DEFICIENCY, FRACTURES): Vit D, 25-Hydroxy: 85 ng/mL (ref 30–100)

## 2021-02-16 LAB — TSH: TSH: 0.51 mIU/L (ref 0.40–4.50)

## 2021-05-17 ENCOUNTER — Ambulatory Visit: Payer: PPO | Admitting: Internal Medicine

## 2021-07-30 ENCOUNTER — Other Ambulatory Visit: Payer: Self-pay | Admitting: Internal Medicine

## 2021-07-30 DIAGNOSIS — Z1231 Encounter for screening mammogram for malignant neoplasm of breast: Secondary | ICD-10-CM

## 2021-08-16 NOTE — Progress Notes (Signed)
CPE   Assessment:    Encounter for Annual Physical Exam with abnormal findings Due annually  Health Maintenance reviewed Healthy lifestyle reviewed and goals set Schedule DEXA with mammogram  Forward shingrix record  Hyperlipidemia, unspecified hyperlipidemia type Strong patient preference to avoid meds, admits could do better with diet/lifestyle, motivated to work on this Cntinue to discuss lifestyle changes - low saturated fat, high fiber, primarily plant based minimally processed Mediterranean type diet encouraged Consider fiber supplement  -     Lipid Profile  Essential hypertension -     CBC with Diff -     COMPLETE METABOLIC PANEL WITH GFR -     TSH - continue medications, DASH diet, exercise and monitor at home. Call if greater than 130/80.   Medication management -     Magnesium  Other abnormal glucose -     Hemoglobin A1c ([facility]) Discussed disease progression and risks Discussed diet/exercise, weight management and risk modification  Vitamin D deficiency -     Vitamin D (25 hydroxy)  Anemia, unspecified type monitor CBC, normal iron, colonoscopy B12 (last borderline)  Glaucoma, unspecified glaucoma type, unspecified laterality Continue follow up eye doctor  History of colon polyps Had colonoscopy 03/2020, none further recommended High fiber diet recommended  Gastroesophageal reflux disease, unspecified whether esophagitis present Well managed on current medications Discussed diet, avoiding triggers and other lifestyle change  Overweight Long discussion about weight loss, diet, and exercise Recommended diet heavy in fruits and veggies and low in animal meats, cheeses, and dairy products, appropriate calorie intake Patient will work on increasing activity, decreasing saturated fat and simple carbs Follow up at next visit    Orders Placed This Encounter  Procedures   CBC with Differential/Platelet   COMPLETE METABOLIC PANEL WITH GFR   Magnesium    Lipid panel   TSH   Hemoglobin A1c   VITAMIN D 25 Hydroxy (Vit-D Deficiency, Fractures)   Urinalysis, Routine w reflex microscopic   Microalbumin / creatinine urine ratio   EKG 12-Lead    Future Appointments  Date Time Provider [place]  08/17/2021  9:00 AM Alycia Rossetti, NP [facility] None  08/21/2021 11:10 AM [facility] 3 [facility]  02/15/2022  9:00 AM Darrol Jump, NP [facility] None  08/19/2022  9:00 AM Alycia Rossetti, NP [facility] None     Subjective:   Robin Zimmerman is a non smoking 81 y.o. female who presents for CPE and 3 month follow up for HTN, chol, glucose management, vitamin D.   She has GERD, was having persistent throat clearing, improved on famotidine. Has only had to use intermittently  BMI is Body mass index is 25.68 kg/m., she has been working on diet and exercise, admits less exercise with heat.  Wt Readings from Last 3 Encounters:  08/17/21 149 lb 9.6 oz (67.9 kg)  02/15/21 144 lb (65.3 kg)  08/17/20 145 lb 6.4 oz (66 kg)   Her blood pressure has been controlled at home, today their BP is BP: 130/84  BP Readings from Last 3 Encounters:  08/17/21 130/84  02/15/21 110/64  08/17/20 112/64  She does not workout, very sporadic.  She denies chest pain, shortness of breath, dizziness.     She is not on cholesterol medication and denies myalgias. Her cholesterol is not at goal. Her mother had hx of CVA in her 63s, patient does not have other risk factors and patient strongly prefers to avoid medications. The cholesterol last visit was:   Lab  Results  Component Value Date   CHOL 230 (H) 02/15/2021   HDL 83 02/15/2021   LDLCALC 129 (H) 02/15/2021   TRIG 82 02/15/2021   CHOLHDL 2.8 02/15/2021   She has been working on diet and exercise for glucose management, and denies polydipsia, polyuria and visual disturbances. Last A1C in the office was:  Lab Results  Component Value Date   HGBA1C 5.5 08/17/2020   Patient is on Vitamin  D supplement, 10000 IU daily. Lab Results  Component Value Date   VD25OH 49 02/15/2021   She has stable anemia, hgb in 11 range, predates 2015;      Latest Ref Rng & Units 02/15/2021   10:10 AM 08/17/2020   10:03 AM 02/16/2020   10:06 AM  CBC  WBC 3.8 - 10.8 Thousand/uL 3.3  3.3  2.7   Hemoglobin 11.7 - 15.5 g/dL 11.9  11.3  11.3   Hematocrit 35.0 - 45.0 % 35.5  35.5  34.3   Platelets 140 - 400 Thousand/uL 251  236  249    Lab Results  Component Value Date   IRON 77 02/16/2020   TIBC 252 02/16/2020   FERRITIN 206 02/16/2020   B12 has been low, patient is taking daily supplement, switched to 500 mcg SL Lab Results  Component Value Date   VITAMINB12 1,394 (H) 08/17/2020      Medication Review      Current Outpatient Medications (Hematological):    Cyanocobalamin (B-12) 500 MCG TABS, Take by mouth daily.   IRON PO, Take by mouth daily.  Current Outpatient Medications (Other):    Cholecalciferol (VITAMIN D PO), Take 5,000 Int'l Units by mouth daily.   famotidine (PEPCID) 40 MG tablet, Take 1 tablet (40 mg total) by mouth every evening.   Magnesium 250 MG TABS, Take by mouth daily.  Current Problems (verified) Patient Active Problem List   Diagnosis Date Noted   Osteopenia 02/14/2021   Bicytopenia 02/17/2020   B12 deficiency 02/17/2020   Seasonal allergies 10/18/2019   Absolute anemia 07/20/2014   Glaucoma 04/14/2014   Vitamin D deficiency 04/04/2013   Medication management 04/04/2013   Other abnormal glucose 01/19/2013   GERD (gastroesophageal reflux disease)    Hyperlipidemia    Hypertension    History of colon polyps 02/16/2007    Screening Tests Immunization History  Administered Date(s) Administered   PFIZER Comirnaty(Gray Top)Covid-19 Tri-Sucrose Vaccine 04/23/2019, 05/18/2019   PFIZER SARS-COV-2 Pediatric Vaccination 5-28yr 02/14/2020   Td 06/30/2012   Zoster Recombinat (Shingrix) 12/22/2019, 02/02/2020    Preventative care: Last colonoscopy:  Scheduled for 08/21/21 Last pap smear/pelvic exam: 2012  remote DEXA:02/14/21 Osteopenia T score -1.1 Prior vaccinations: TD or Tdap: 2014 Influenza: declines Prevnar 13 Declines Pneumococcal: declines Shingrix: reports has 2/2- report requested Covid 19: 3/3, pfizer  Names of Other Physician/Practitioners you currently use: 1. [facility]- here for primary care 2.  [facility], last visit 2022 3. Dr. ? , dental, last visit - last 02/2021  Patient Care Team: MUnk Pinto MD as PCP - General (Internal Medicine) MJuanita Craver MD as Consulting Physician (Gastroenterology)   Allergies Allergies  Allergen Reactions   Prednisone Other (See Comments)    Hallucinations    SURGICAL HISTORY She  has a past surgical history that includes Tubal ligation; Cholecystectomy; and Cataract extraction (Right, 06/16/2017). FAMILY HISTORY Her family history includes Alzheimer's disease in her father; Bone cancer in her brother; Brain cancer in her sister; Cancer in her sister; Colon  cancer in her brother and sister; Dementia (age of onset: 29) in her sister; Diabetes in her sister; Heart disease in her mother; Heart disease (age of onset: 66) in her brother; Prostate cancer in her brother; Throat cancer in her brother. SOCIAL HISTORY She  reports that she quit smoking about 56 years ago. Her smoking use included cigarettes. She started smoking about 61 years ago. She has a 1.25 pack-year smoking history. She has never used smokeless tobacco. She reports that she does not drink alcohol and does not use drugs.  Review of Systems  Constitutional:  Negative for malaise/fatigue and weight loss.  HENT:  Negative for hearing loss and tinnitus.   Eyes:  Negative for blurred vision and double vision.  Respiratory:  Negative for cough, shortness of breath and wheezing.   Cardiovascular:  Negative for chest pain, palpitations, orthopnea, claudication and leg  swelling.  Gastrointestinal:  Negative for abdominal pain, blood in stool, constipation, diarrhea, heartburn, melena, nausea and vomiting.  Genitourinary: Negative.        Stress incontinence  Musculoskeletal:  Negative for falls, joint pain and myalgias.  Skin:  Negative for rash.  Neurological:  Negative for dizziness, tingling, sensory change, weakness and headaches.  Endo/Heme/Allergies:  Negative for polydipsia.  Psychiatric/Behavioral: Negative.    All other systems reviewed and are negative.     Objective:     Blood pressure 130/84, pulse 84, temperature 97.9 F (36.6 C), resp. rate 16, height '5\' 4"'$  (1.626 m), weight 149 lb 9.6 oz (67.9 kg), SpO2 97 %. Body mass index is 25.68 kg/m.  General appearance: alert, no distress, WD/WN,  female HEENT: normocephalic, sclerae anicteric, TMs pearly, nares patent, no discharge or erythema, pharynx normal Oral cavity: MMM, no lesions Neck: supple, no lymphadenopathy, no thyromegaly, no masses Heart: RRR, normal S1, S2, no murmurs Lungs: CTA bilaterally, no wheezes, rhonchi, or rales Abdomen: +bs, soft, non-tender, non distended, no rebound, no masses, no hepatomegaly, no splenomegaly Musculoskeletal: full ROM, no swelling, no obvious deformity. Lateral tenderness over trochanter and surrounding musculature Extremities: no edema, no cyanosis, no clubbing Skin: Seb keratosis over back and AB- unchanged , multiple moles of face Pulses: 2+ symmetric, upper and lower extremities, normal cap refill Neurological: alert, oriented x 3, CN2-12 intact, strength normal upper extremities and lower extremities, sensation normal throughout, DTRs 2+ throughout, no cerebellar signs, gait normal Psychiatric: normal affect, behavior normal, pleasant  Breasts: breasts appear normal, no suspicious masses, no skin or nipple changes or axillary nodes. GU: defer  EKG: NSR, no ST changes   Alycia Rossetti, NP   08/17/2021

## 2021-08-17 ENCOUNTER — Ambulatory Visit (INDEPENDENT_AMBULATORY_CARE_PROVIDER_SITE_OTHER): Payer: PPO | Admitting: Nurse Practitioner

## 2021-08-17 ENCOUNTER — Encounter: Payer: PPO | Admitting: Adult Health

## 2021-08-17 ENCOUNTER — Encounter: Payer: Self-pay | Admitting: Nurse Practitioner

## 2021-08-17 VITALS — BP 130/84 | HR 84 | Temp 97.9°F | Resp 16 | Ht 64.0 in | Wt 149.6 lb

## 2021-08-17 DIAGNOSIS — Z1389 Encounter for screening for other disorder: Secondary | ICD-10-CM | POA: Diagnosis not present

## 2021-08-17 DIAGNOSIS — Z136 Encounter for screening for cardiovascular disorders: Secondary | ICD-10-CM | POA: Diagnosis not present

## 2021-08-17 DIAGNOSIS — R7309 Other abnormal glucose: Secondary | ICD-10-CM

## 2021-08-17 DIAGNOSIS — Z1329 Encounter for screening for other suspected endocrine disorder: Secondary | ICD-10-CM | POA: Diagnosis not present

## 2021-08-17 DIAGNOSIS — I10 Essential (primary) hypertension: Secondary | ICD-10-CM | POA: Diagnosis not present

## 2021-08-17 DIAGNOSIS — Z0001 Encounter for general adult medical examination with abnormal findings: Secondary | ICD-10-CM

## 2021-08-17 DIAGNOSIS — E785 Hyperlipidemia, unspecified: Secondary | ICD-10-CM | POA: Diagnosis not present

## 2021-08-17 DIAGNOSIS — E663 Overweight: Secondary | ICD-10-CM

## 2021-08-17 DIAGNOSIS — Z79899 Other long term (current) drug therapy: Secondary | ICD-10-CM | POA: Diagnosis not present

## 2021-08-17 DIAGNOSIS — E559 Vitamin D deficiency, unspecified: Secondary | ICD-10-CM

## 2021-08-17 DIAGNOSIS — K219 Gastro-esophageal reflux disease without esophagitis: Secondary | ICD-10-CM

## 2021-08-17 DIAGNOSIS — H409 Unspecified glaucoma: Secondary | ICD-10-CM

## 2021-08-17 DIAGNOSIS — Z0000 Encounter for general adult medical examination without abnormal findings: Secondary | ICD-10-CM | POA: Diagnosis not present

## 2021-08-17 DIAGNOSIS — Z86010 Personal history of colonic polyps: Secondary | ICD-10-CM

## 2021-08-17 DIAGNOSIS — D649 Anemia, unspecified: Secondary | ICD-10-CM

## 2021-08-17 NOTE — Patient Instructions (Signed)

## 2021-08-18 LAB — COMPLETE METABOLIC PANEL WITH GFR
AG Ratio: 1.5 (calc) (ref 1.0–2.5)
ALT: 9 U/L (ref 6–29)
AST: 15 U/L (ref 10–35)
Albumin: 4.3 g/dL (ref 3.6–5.1)
Alkaline phosphatase (APISO): 65 U/L (ref 37–153)
BUN/Creatinine Ratio: 13 (calc) (ref 6–22)
BUN: 12 mg/dL (ref 7–25)
CO2: 30 mmol/L (ref 20–32)
Calcium: 9.4 mg/dL (ref 8.6–10.4)
Chloride: 104 mmol/L (ref 98–110)
Creat: 0.96 mg/dL — ABNORMAL HIGH (ref 0.60–0.95)
Globulin: 2.8 g/dL (calc) (ref 1.9–3.7)
Glucose, Bld: 98 mg/dL (ref 65–99)
Potassium: 4 mmol/L (ref 3.5–5.3)
Sodium: 140 mmol/L (ref 135–146)
Total Bilirubin: 0.4 mg/dL (ref 0.2–1.2)
Total Protein: 7.1 g/dL (ref 6.1–8.1)
eGFR: 60 mL/min/{1.73_m2} (ref >=60)

## 2021-08-18 LAB — MICROALBUMIN / CREATININE URINE RATIO
Creatinine, Urine: 63 mg/dL (ref 20–275)
Microalb, Ur: <0.2 mg/dL

## 2021-08-18 LAB — URINALYSIS, ROUTINE W REFLEX MICROSCOPIC
Bilirubin Urine: NEGATIVE
Glucose, UA: NEGATIVE
Hgb urine dipstick: NEGATIVE
Ketones, ur: NEGATIVE
Leukocytes,Ua: NEGATIVE
Nitrite: NEGATIVE
Protein, ur: NEGATIVE
Specific Gravity, Urine: 1.007 (ref 1.001–1.035)
pH: 7 (ref 5.0–8.0)

## 2021-08-18 LAB — TSH: TSH: 0.5 mIU/L (ref 0.40–4.50)

## 2021-08-18 LAB — CBC WITH DIFFERENTIAL/PLATELET
Absolute Monocytes: 182 cells/uL — ABNORMAL LOW (ref 200–950)
Basophils Absolute: 19 cells/uL (ref 0–200)
Basophils Relative: 0.6 %
Eosinophils Absolute: 70 cells/uL (ref 15–500)
Eosinophils Relative: 2.2 %
HCT: 35.9 % (ref 35.0–45.0)
Hemoglobin: 11.8 g/dL (ref 11.7–15.5)
Lymphs Abs: 1002 cells/uL (ref 850–3900)
MCH: 30.3 pg (ref 27.0–33.0)
MCHC: 32.9 g/dL (ref 32.0–36.0)
MCV: 92.3 fL (ref 80.0–100.0)
MPV: 9.5 fL (ref 7.5–12.5)
Monocytes Relative: 5.7 %
Neutro Abs: 1926 cells/uL (ref 1500–7800)
Neutrophils Relative %: 60.2 %
Platelets: 221 10*3/uL (ref 140–400)
RBC: 3.89 10*6/uL (ref 3.80–5.10)
RDW: 12.2 % (ref 11.0–15.0)
Total Lymphocyte: 31.3 %
WBC: 3.2 10*3/uL — ABNORMAL LOW (ref 3.8–10.8)

## 2021-08-18 LAB — LIPID PANEL
Cholesterol: 250 mg/dL — ABNORMAL HIGH (ref <200)
HDL: 96 mg/dL (ref >=50)
LDL Cholesterol (Calc): 139 mg/dL (calc) — ABNORMAL HIGH
Non-HDL Cholesterol (Calc): 154 mg/dL (calc) — ABNORMAL HIGH (ref <130)
Total CHOL/HDL Ratio: 2.6 (calc) (ref <5.0)
Triglycerides: 63 mg/dL (ref <150)

## 2021-08-18 LAB — VITAMIN D 25 HYDROXY (VIT D DEFICIENCY, FRACTURES): Vit D, 25-Hydroxy: 58 ng/mL (ref 30–100)

## 2021-08-18 LAB — HEMOGLOBIN A1C
Hgb A1c MFr Bld: 5.5 % of total Hgb (ref <5.7)
Mean Plasma Glucose: 111 mg/dL
eAG (mmol/L): 6.2 mmol/L

## 2021-08-18 LAB — MAGNESIUM: Magnesium: 2.3 mg/dL (ref 1.5–2.5)

## 2021-08-21 ENCOUNTER — Ambulatory Visit: Payer: PPO

## 2021-09-07 ENCOUNTER — Ambulatory Visit
Admission: RE | Admit: 2021-09-07 | Discharge: 2021-09-07 | Disposition: A | Discharge status: Home / Self Care | Payer: PPO | Source: Ambulatory Visit | Attending: Internal Medicine | Admitting: Internal Medicine

## 2021-09-07 DIAGNOSIS — Z1231 Encounter for screening mammogram for malignant neoplasm of breast: Secondary | ICD-10-CM

## 2021-12-12 DIAGNOSIS — H2512 Age-related nuclear cataract, left eye: Secondary | ICD-10-CM | POA: Diagnosis not present

## 2021-12-12 DIAGNOSIS — Z961 Presence of intraocular lens: Secondary | ICD-10-CM | POA: Diagnosis not present

## 2021-12-12 DIAGNOSIS — H40003 Preglaucoma, unspecified, bilateral: Secondary | ICD-10-CM | POA: Diagnosis not present

## 2022-02-15 ENCOUNTER — Other Ambulatory Visit: Payer: Self-pay

## 2022-02-15 ENCOUNTER — Encounter: Payer: Self-pay | Admitting: Nurse Practitioner

## 2022-02-15 ENCOUNTER — Ambulatory Visit (INDEPENDENT_AMBULATORY_CARE_PROVIDER_SITE_OTHER): Payer: PPO | Admitting: Nurse Practitioner

## 2022-02-15 VITALS — BP 140/82 | HR 89 | Temp 97.9°F | Ht 64.0 in | Wt 148.0 lb

## 2022-02-15 DIAGNOSIS — E538 Deficiency of other specified B group vitamins: Secondary | ICD-10-CM

## 2022-02-15 DIAGNOSIS — Z79899 Other long term (current) drug therapy: Secondary | ICD-10-CM | POA: Diagnosis not present

## 2022-02-15 DIAGNOSIS — I10 Essential (primary) hypertension: Secondary | ICD-10-CM | POA: Diagnosis not present

## 2022-02-15 DIAGNOSIS — E785 Hyperlipidemia, unspecified: Secondary | ICD-10-CM

## 2022-02-15 DIAGNOSIS — Z0000 Encounter for general adult medical examination without abnormal findings: Secondary | ICD-10-CM

## 2022-02-15 DIAGNOSIS — J302 Other seasonal allergic rhinitis: Secondary | ICD-10-CM

## 2022-02-15 DIAGNOSIS — M8580 Other specified disorders of bone density and structure, unspecified site: Secondary | ICD-10-CM | POA: Diagnosis not present

## 2022-02-15 DIAGNOSIS — R7309 Other abnormal glucose: Secondary | ICD-10-CM | POA: Diagnosis not present

## 2022-02-15 DIAGNOSIS — R6889 Other general symptoms and signs: Secondary | ICD-10-CM | POA: Diagnosis not present

## 2022-02-15 DIAGNOSIS — K219 Gastro-esophageal reflux disease without esophagitis: Secondary | ICD-10-CM | POA: Diagnosis not present

## 2022-02-15 DIAGNOSIS — H409 Unspecified glaucoma: Secondary | ICD-10-CM

## 2022-02-15 DIAGNOSIS — Z86010 Personal history of colonic polyps: Secondary | ICD-10-CM

## 2022-02-15 DIAGNOSIS — D649 Anemia, unspecified: Secondary | ICD-10-CM | POA: Diagnosis not present

## 2022-02-15 DIAGNOSIS — E559 Vitamin D deficiency, unspecified: Secondary | ICD-10-CM

## 2022-02-15 DIAGNOSIS — J22 Unspecified acute lower respiratory infection: Secondary | ICD-10-CM | POA: Diagnosis not present

## 2022-02-15 DIAGNOSIS — Z0001 Encounter for general adult medical examination with abnormal findings: Secondary | ICD-10-CM | POA: Diagnosis not present

## 2022-02-15 MED ORDER — AZITHROMYCIN 500 MG PO TABS: 500.0000 mg | ORAL_TABLET | Freq: Every day | ORAL | 0 refills | Status: AC

## 2022-02-15 NOTE — Progress Notes (Signed)
AWV and 6 month follow up  Assessment:    Annual Medicare Wellness Visit Due annually  Health maintenance reviewed  Hyperlipidemia, unspecified hyperlipidemia type Discussed lifestyle modifications. Recommended diet heavy in fruits and veggies, omega 3's. Decrease consumption of animal meats, cheeses, and dairy products. Remain active and exercise as tolerated. Continue to monitor. Check lipids/TSH  Essential hypertension Discussed DASH (Dietary Approaches to Stop Hypertension) DASH diet is lower in sodium than a typical American diet. Cut back on foods that are high in saturated fat, cholesterol, and trans fats. Eat more whole-grain foods, fish, poultry, and nuts Remain active and exercise as tolerated daily.  Monitor BP at home-Call if greater than 130/80.  Check CMP/CBC   Medication management All medications discussed and reviewed in full. All questions and concerns regarding medications addressed.    Other abnormal glucose Education: Reviewed 'ABCs' of diabetes management  Discussed goals to be met and/or maintained include A1C (<7) Blood pressure (<130/80) Cholesterol (LDL <70) Continue Eye Exam yearly  Continue Dental Exam Q6 mo Discussed dietary recommendations Discussed Physical Activity recommendations Check A1C   Vitamin D deficiency Continue supplement Monitor levels  Anemia, unspecified type Monitor CBC, normal iron  Colonoscopy UTD  Glaucoma, unspecified glaucoma type, unspecified laterality Continue follow up eye doctor  History of colon polyps Had colonoscopy 03/2020, none further recommended High fiber diet recommended  Gastroesophageal reflux disease, unspecified whether esophagitis present No suspected reflux complications (Barret/stricture). Lifestyle modification:  wt loss, avoid meals 2-3h before bedtime. Consider eliminating food triggers:  chocolate, caffeine, EtOH, acid/spicy food.  B12 def Slightly above goal  Continue to  monitor  Osteopenia Completed 02/2021 T-Score -1.1 Pursue a combination of weight-bearing exercises and strength training. Advised on fall prevention measures including proper lighting in all rooms, removal of area rugs and floor clutter, use of walking devices as deemed appropriate, avoidance of uneven walking surfaces. Smoking cessation and moderate alcohol consumption if applicable Consume 035 to 1000 IU of vitamin D daily with a goal vitamin D serum value of 30 ng/mL or higher. Aim for 1000 to 1200 mg of elemental calcium daily through supplements and/or dietary sources.  Seasonal allergies Continue antihistamine PRN Avoiid triggers   Lower respiratory infection Start Azithromycin Suggest CXR if s/s fail to improve. Stay well hydrated to keep mucus thin and productive. Report to ER or call 911 for any increase in SOB  Orders Placed This Encounter  Procedures   CBC with Differential/Platelet   COMPLETE METABOLIC PANEL WITH GFR   Lipid panel   Hemoglobin A1c   VITAMIN D 25 Hydroxy (Vit-D Deficiency, Fractures)   Iron, TIBC and Ferritin Panel   Vitamin B12   Meds ordered this encounter  Medications   azithromycin (ZITHROMAX) 500 MG tablet    Sig: Take 1 tablet (500 mg total) by mouth daily for 7 days.    Dispense:  7 tablet    Refill:  0    Order Specific Question:   Supervising Provider    Answer:   Unk Pinto [phone]    Notify office for further evaluation and treatment, questions or concerns if any reported s/s fail to improve.   The patient was advised to call back or seek an in-person evaluation if any symptoms worsen or if the condition fails to improve as anticipated.   Further disposition pending results of labs. Discussed med's effects and SE's.    I discussed the assessment and treatment plan with the patient. The patient was provided an opportunity to ask questions  and all were answered. The patient agreed with the plan and demonstrated an understanding  of the instructions.  Discussed med's effects and SE's. Screening labs and tests as requested with regular follow-up as recommended.  I provided 40 minutes of face-to-face time during this encounter including counseling, chart review, and critical decision making was preformed.  Future Appointments  Date Time Provider [place]  08/19/2022  9:00 AM Alycia Rossetti, NP [facility] None  02/18/2023 11:00 AM Darrol Jump, NP [facility] None    Plan:   During the course of the visit the patient was educated and counseled about appropriate screening and preventive services including:   Pneumococcal vaccine  Prevnar 13 Influenza vaccine Td vaccine Screening electrocardiogram Bone densitometry screening Colorectal cancer screening Diabetes screening Glaucoma screening Nutrition counseling  Advanced directives: requested   Subjective:   Robin Zimmerman is a non smoking 82 y.o. female who presents for AWV and 3 month follow up. She has GERD (gastroesophageal reflux disease); Hyperlipidemia; Hypertension; Other abnormal glucose; Vitamin D deficiency; Medication management; History of colon polyps; Glaucoma; Absolute anemia; Seasonal allergies; Bicytopenia; B12 deficiency; and Osteopenia on their problem list.  Shares with me today that she has been "fighting" and URI since 11/30/22.  She has not taken any medications for tmt of symptoms.  She takes daily supplements including B-12, Magnesium, Vitamin D.  She is trying to stay well hydrated.  Reports continuing nasal congestion, cough, fatigue.  Denies fever, chills, N/V.    She has GERD, was having persistent throat clearing, improved on famotidine.  Continues to take as directed.  No recent uncontrolled flare.    BMI is Body mass index is 25.4 kg/m., she has been working on diet and exercise. Wt Readings from Last 3 Encounters:  02/15/22 148 lb (67.1 kg)  08/17/21 149 lb 9.6 oz (67.9 kg)  02/15/21 144 lb (65.3 kg)    Her blood pressure has been controlled at home, today their BP is BP: (!) 140/82 She does not workout, very sporadic.  She denies chest pain, shortness of breath, dizziness.    She is not on cholesterol medication and denies myalgias. Her cholesterol is not at goal. Her mother had hx of CVA in her 80s, patient does not have other risk factors and patient strongly prefers to avoid medications. The cholesterol last visit was:   Lab Results  Component Value Date   CHOL 250 (H) 08/17/2021   HDL 96 08/17/2021   LDLCALC 139 (H) 08/17/2021   TRIG 63 08/17/2021   CHOLHDL 2.6 08/17/2021   She has been working on diet and exercise for glucose management, and denies polydipsia, polyuria and visual disturbances. Last A1C in the office was:  Lab Results  Component Value Date   HGBA1C 5.5 08/17/2021   Patient is on Vitamin D supplement, increased from 5000 IU - 10000 IU daily. Lab Results  Component Value Date   VD25OH 58 08/17/2021   She has stable anemia, hgb in 11 range, predates 2015;      Latest Ref Rng & Units 08/17/2021    9:20 AM 02/15/2021   10:10 AM 08/17/2020   10:03 AM  CBC  WBC 3.8 - 10.8 Thousand/uL 3.2  3.3  3.3   Hemoglobin 11.7 - 15.5 g/dL 11.8  11.9  11.3   Hematocrit 35.0 - 45.0 % 35.9  35.5  35.5   Platelets 140 - 400 Thousand/uL 221  251  236    Lab Results  Component Value Date   IRON  77 02/16/2020   TIBC 252 02/16/2020   FERRITIN 206 02/16/2020   B12 has been low, patient is taking daily supplement, switched to 500 mcg SL Lab Results  Component Value Date   VITAMINB12 1,394 (H) 08/17/2020    Medication Review     Current Outpatient Medications (Hematological):    Cyanocobalamin (B-12) 500 MCG TABS, Take by mouth daily.   IRON PO, Take by mouth daily.  Current Outpatient Medications (Other):    azithromycin (ZITHROMAX) 500 MG tablet, Take 1 tablet (500 mg total) by mouth daily for 7 days.   Cholecalciferol (VITAMIN D PO), Take 5,000 Int'l Units by mouth  daily.   famotidine (PEPCID) 40 MG tablet, Take 1 tablet (40 mg total) by mouth every evening. (Patient taking differently: Take 40 mg by mouth as needed.)   Magnesium 250 MG TABS, Take by mouth daily.  Current Problems (verified) Patient Active Problem List   Diagnosis Date Noted   Osteopenia 02/14/2021   Bicytopenia 02/17/2020   B12 deficiency 02/17/2020   Seasonal allergies 10/18/2019   Absolute anemia 07/20/2014   Glaucoma 04/14/2014   Vitamin D deficiency 04/04/2013   Medication management 04/04/2013   Other abnormal glucose 01/19/2013   GERD (gastroesophageal reflux disease)    Hyperlipidemia    Hypertension    History of colon polyps 02/16/2007    Screening Tests Immunization History  Administered Date(s) Administered   PFIZER Comirnaty(Gray Top)Covid-19 Tri-Sucrose Vaccine 04/23/2019, 05/18/2019   PFIZER SARS-COV-2 Pediatric Vaccination 5-63yr 02/14/2020   Td 06/30/2012   Zoster Recombinat (Shingrix) 12/22/2019, 02/02/2020   Health Maintenance  Topic Date Due   Medicare Annual Wellness (AWV)  Never done   COVID-19 Vaccine (3 - Pfizer risk series) 03/13/2020   INFLUENZA VACCINE  Never done   Pneumonia Vaccine 82 Years old (1 - PCV) 02/15/2022 (Originally 10/18/1946)   DTaP/Tdap/Td (2 - Tdap) 07/01/2022   DEXA SCAN  02/15/2023   Zoster Vaccines- Shingrix  Completed   HPV VACCINES  Aged Out   Last colonoscopy: 03/22/2020 - 1 polyp remoted, DONE Last mammogram: 08/2021 Due 08/2022  Last pap smear/pelvic exam: 2012  remote DEXA: DEXA 02/14/2021 lumbar T-1.1 Due 20205  Shingrix: reports has 2/2- report requested   Names of Other Physician/Practitioners you currently use: 1. [facility]- here for primary care 2. Dr. COlin Hauser eye doctor, last visit 2023, mild L cataract 3. Dental, last visit - last 2023  Patient Care Team: MUnk Pinto MD as PCP - General (Internal Medicine) MJuanita Craver MD as Consulting Physician  (Gastroenterology)   Allergies Allergies  Allergen Reactions   Prednisone Other (See Comments)    Hallucinations    SURGICAL HISTORY She  has a past surgical history that includes Tubal ligation; Cholecystectomy; and Cataract extraction (Right, 06/16/2017). FAMILY HISTORY Her family history includes Alzheimer's disease in her father; Bone cancer in her brother; Brain cancer in her sister; Cancer in her sister; Colon cancer in her brother and sister; Dementia (age of onset: 975 in her sister; Diabetes in her sister; Heart disease in her mother; Heart disease (age of onset: 487 in her brother; Prostate cancer in her brother; Throat cancer in her brother. SOCIAL HISTORY She  reports that she quit smoking about 57 years ago. Her smoking use included cigarettes. She started smoking about 62 years ago. She has a 1.25 pack-year smoking history. She has never used smokeless tobacco. She reports that she does not drink alcohol and does not use drugs.  MEDICARE WELLNESS OBJECTIVES:  Physical activity: Current Exercise Habits: Home exercise routine, Exercise limited by: orthopedic condition(s) Cardiac risk factors:   Depression/mood screen:      02/15/2022   10:02 AM  Depression screen PHQ 2/9  Decreased Interest 0  Down, Depressed, Hopeless 0  PHQ - 2 Score 0    ADLs:     02/15/2022   10:02 AM  In your present state of health, do you have any difficulty performing the following activities:  Hearing? 0  Vision? 0  Difficulty concentrating or making decisions? 0  Walking or climbing stairs? 0  Dressing or bathing? 0  Doing errands, shopping? 0  Preparing Food and eating ? N  Using the Toilet? N  In the past six months, have you accidently leaked urine? N  Do you have problems with loss of bowel control? N  Managing your Medications? N  Managing your Finances? N  Housekeeping or managing your Housekeeping? N     Cognitive Testing  Alert? Yes  Normal Appearance?Yes  Oriented to  person? Yes  Place? Yes   Time? Yes  Recall of three objects?  Yes  Can perform simple calculations? Yes  Displays appropriate judgment?Yes  Can read the correct time from a watch face?Yes  EOL planning: Does Patient Have a Medical Advance Directive?: No   Review of Systems  Constitutional:  Negative for malaise/fatigue and weight loss.  HENT:  Negative for hearing loss and tinnitus.   Eyes:  Negative for blurred vision and double vision.  Respiratory:  Negative for cough, shortness of breath and wheezing.   Cardiovascular:  Negative for chest pain, palpitations, orthopnea, claudication and leg swelling.  Gastrointestinal:  Negative for abdominal pain, blood in stool, constipation, diarrhea, heartburn, melena, nausea and vomiting.  Genitourinary: Negative.        Stress incontinence  Musculoskeletal:  Negative for falls, joint pain and myalgias.  Skin:  Negative for rash.  Neurological:  Negative for dizziness, tingling, sensory change, weakness and headaches.  Endo/Heme/Allergies:  Negative for polydipsia.  Psychiatric/Behavioral: Negative.    All other systems reviewed and are negative.     Objective:     Blood pressure (!) 140/82, pulse 89, temperature 97.9 F (36.6 C), height '5\' 4"'$  (1.626 m), weight 148 lb (67.1 kg), SpO2 99 %. Body mass index is 25.4 kg/m.  General appearance: alert, no distress, WD/WN,  female HEENT: normocephalic, sclerae anicteric, TMs pearly, nares patent, no discharge or erythema, pharynx normal Oral cavity: MMM, no lesions Neck: supple, no lymphadenopathy, no thyromegaly, no masses Heart: RRR, normal S1, S2, no murmurs Lungs: Bilateral lower lung fields with distant breath sounds, rhonchi.  No tenderness to palpation. Abdomen: +bs, soft, non-tender, non distended, no rebound, no masses, no hepatomegaly, no splenomegaly Musculoskeletal: full ROM, no swelling, no obvious deformity. Lateral tenderness over trochanter and surrounding  musculature Extremities: no edema, no cyanosis, no clubbing Skin: Seb keratosis over back and AB- unchanged  Pulses: 2+ symmetric, upper and lower extremities, normal cap refill Neurological: alert, oriented x 3, CN2-12 intact, strength normal upper extremities and lower extremities, sensation normal throughout, DTRs 2+ throughout, no cerebellar signs, gait normal Psychiatric: normal affect, behavior normal, pleasant    Medicare Attestation I have personally reviewed: The patient's medical and social history Their use of alcohol, tobacco or illicit drugs Their current medications and supplements The patient's functional ability including ADLs,fall risks, home safety risks, cognitive, and hearing and visual impairment Diet and physical activities Evidence for depression or mood disorders  The patient's weight,  height, BMI, and visual acuity have been recorded in the chart.  I have made referrals, counseling, and provided education to the patient based on review of the above and I have provided the patient with a written personalized care plan for preventive services.     Darrol Jump, NP   02/15/2022

## 2022-02-15 NOTE — Patient Instructions (Signed)

## 2022-02-16 LAB — CBC WITH DIFFERENTIAL/PLATELET
Absolute Monocytes: 241 cells/uL (ref 200–950)
Basophils Absolute: 20 cells/uL (ref 0–200)
Basophils Relative: 0.6 %
Eosinophils Absolute: 79 cells/uL (ref 15–500)
Eosinophils Relative: 2.4 %
HCT: 35.1 % (ref 35.0–45.0)
Hemoglobin: 11.7 g/dL (ref 11.7–15.5)
Lymphs Abs: 1073 cells/uL (ref 850–3900)
MCH: 30.2 pg (ref 27.0–33.0)
MCHC: 33.3 g/dL (ref 32.0–36.0)
MCV: 90.7 fL (ref 80.0–100.0)
MPV: 9.9 fL (ref 7.5–12.5)
Monocytes Relative: 7.3 %
Neutro Abs: 1888 cells/uL (ref 1500–7800)
Neutrophils Relative %: 57.2 %
Platelets: 278 10*3/uL (ref 140–400)
RBC: 3.87 10*6/uL (ref 3.80–5.10)
RDW: 12.3 % (ref 11.0–15.0)
Total Lymphocyte: 32.5 %
WBC: 3.3 10*3/uL — ABNORMAL LOW (ref 3.8–10.8)

## 2022-02-16 LAB — COMPLETE METABOLIC PANEL WITH GFR
AG Ratio: 1.4 (calc) (ref 1.0–2.5)
ALT: 9 U/L (ref 6–29)
AST: 14 U/L (ref 10–35)
Albumin: 4.2 g/dL (ref 3.6–5.1)
Alkaline phosphatase (APISO): 64 U/L (ref 37–153)
BUN: 15 mg/dL (ref 7–25)
CO2: 30 mmol/L (ref 20–32)
Calcium: 9.4 mg/dL (ref 8.6–10.4)
Chloride: 105 mmol/L (ref 98–110)
Creat: 0.94 mg/dL (ref 0.60–0.95)
Globulin: 3 g/dL (calc) (ref 1.9–3.7)
Glucose, Bld: 80 mg/dL (ref 65–99)
Potassium: 4.3 mmol/L (ref 3.5–5.3)
Sodium: 142 mmol/L (ref 135–146)
Total Bilirubin: 0.4 mg/dL (ref 0.2–1.2)
Total Protein: 7.2 g/dL (ref 6.1–8.1)
eGFR: 61 mL/min/{1.73_m2} (ref >=60)

## 2022-02-16 LAB — IRON,TIBC AND FERRITIN PANEL
%SAT: 30 % (calc) (ref 16–45)
Ferritin: 264 ng/mL (ref 16–288)
Iron: 77 ug/dL (ref 45–160)
TIBC: 255 mcg/dL (calc) (ref 250–450)

## 2022-02-16 LAB — LIPID PANEL
Cholesterol: 230 mg/dL — ABNORMAL HIGH (ref <200)
HDL: 86 mg/dL (ref >=50)
LDL Cholesterol (Calc): 128 mg/dL (calc) — ABNORMAL HIGH
Non-HDL Cholesterol (Calc): 144 mg/dL (calc) — ABNORMAL HIGH (ref <130)
Total CHOL/HDL Ratio: 2.7 (calc) (ref <5.0)
Triglycerides: 64 mg/dL (ref <150)

## 2022-02-16 LAB — VITAMIN B12: Vitamin B-12: 1148 pg/mL — ABNORMAL HIGH (ref 200–1100)

## 2022-02-16 LAB — HEMOGLOBIN A1C
Hgb A1c MFr Bld: 5.8 % of total Hgb — ABNORMAL HIGH (ref <5.7)
Mean Plasma Glucose: 120 mg/dL
eAG (mmol/L): 6.6 mmol/L

## 2022-02-16 LAB — VITAMIN D 25 HYDROXY (VIT D DEFICIENCY, FRACTURES): Vit D, 25-Hydroxy: 53 ng/mL (ref 30–100)

## 2022-03-21 DIAGNOSIS — M79641 Pain in right hand: Secondary | ICD-10-CM | POA: Diagnosis not present

## 2022-03-22 ENCOUNTER — Encounter: Payer: Self-pay | Admitting: Nurse Practitioner

## 2022-03-22 ENCOUNTER — Ambulatory Visit (INDEPENDENT_AMBULATORY_CARE_PROVIDER_SITE_OTHER): Payer: PPO | Admitting: Nurse Practitioner

## 2022-03-22 VITALS — BP 136/86 | HR 91 | Temp 97.7°F | Ht 64.0 in | Wt 145.2 lb

## 2022-03-22 DIAGNOSIS — L539 Erythematous condition, unspecified: Secondary | ICD-10-CM

## 2022-03-22 DIAGNOSIS — R600 Localized edema: Secondary | ICD-10-CM

## 2022-03-22 DIAGNOSIS — M25541 Pain in joints of right hand: Secondary | ICD-10-CM

## 2022-03-22 MED ORDER — SULFAMETHOXAZOLE-TRIMETHOPRIM 400-80 MG PO TABS: 1.0000 | ORAL_TABLET | Freq: Two times a day (BID) | ORAL | 0 refills | Status: AC

## 2022-03-22 NOTE — Progress Notes (Signed)
Assessment and Plan:  Robin Zimmerman was seen today for an episodic visit.  Diagnoses and all order for this visit:  Arthralgia of right hand Cellulitis versus gout Will obtain blood work to assess for underlying etiology Continue Colchicine 0.6 mg for pain  May take IBU as directed needed Start tmt with abx. Will contact patient tomorrow for direction in further plan of care once lab work as confirmed.  - CBC with Differential/Platelet - Uric acid - sulfamethoxazole-trimethoprim (BACTRIM) 400-80 MG tablet; Take 1 tablet by mouth 2 (two) times daily for 10 days.  Dispense: 20 tablet; Refill: 0   Erythema/localized edema Traced area of redness with permanent marker Continue to monitor for spreading of redness associated with systematic symptoms such as fever, chills, N/V. Report to ER if noticed Will start prophylactic tmt with Bactrim until labs confirm.  - CBC with Differential/Platelet - Uric acid - sulfamethoxazole-trimethoprim (BACTRIM) 400-80 MG tablet; Take 1 tablet by mouth 2 (two) times daily for 10 days.  Dispense: 20 tablet; Refill: 0   Notify office for further evaluation and treatment, questions or concerns if s/s fail to improve. The risks and benefits of my recommendations, as well as other treatment options were discussed with the patient today. Questions were answered.  Further disposition pending results of labs. Discussed med's effects and SE's.    Over 15 minutes of exam, counseling, chart review, and critical decision making was performed.   Future Appointments  Date Time Provider [place]  05/17/2022 10:30 AM Unk Pinto, MD [facility] None  08/19/2022  9:00 AM Alycia Rossetti, NP [facility] None  02/18/2023 11:00 AM Darrol Jump, NP [facility] None    ------------------------------------------------------------------------------------------------------------------   HPI BP 136/86   Pulse 91   Temp 97.7 F (36.5 C)   Ht '5\' 4"'$   (1.626 m)   Wt 145 lb 3.2 oz (65.9 kg)   SpO2 99%   BMI 24.92 kg/m   Patient here for evaluation of possible acute gouty arthritis. The patient reports no hx of gout however was seen in UC yesterday and was treated for gout flare in middle finger of right hand.  The area of pain is localized around the PIP joint. She has colchicine on hand but has not started treatment.  She has not had an attack of this nature in the past.  She does have a hx of arthritis.  Patient reports the acute pain is unchanged, her joint stiffness is unchanged and her joint swelling is unchanged. Limitation on activities include difficulty with ADLs - due to swelling of finger joint. The patient is avoiding high purine foods.  She does not report any bites or cuts to the finger.  She has not taken any medications for treatment.  She is very receptive to medications.  Reports hallucination with Prednisone.  Denies fever, chills, N/V, injury, trauma to hand.   Past Medical History:  Diagnosis Date   Anemia    Colon polyp    Diverticula of colon    Gastrointestinal ulcer due to Helicobacter pylori [id number]   Overview:  Helicobacter Pylori (H. Pylori) Infection    GERD (gastroesophageal reflux disease)    Glaucoma    Hyperlipidemia    Hypertension    Thyroid disease      Allergies  Allergen Reactions   Prednisone Other (See Comments)    Hallucinations    Current Outpatient Medications on File Prior to Visit  Medication Sig   Cholecalciferol (VITAMIN D PO) Take 5,000 Int'l Units by mouth  daily.   colchicine 0.6 MG tablet Take 0.6 mg by mouth daily.   Cyanocobalamin (B-12) 500 MCG TABS Take by mouth daily.   famotidine (PEPCID) 40 MG tablet Take 1 tablet (40 mg total) by mouth every evening. (Patient taking differently: Take 40 mg by mouth as needed.)   IRON PO Take by mouth daily.   Magnesium 250 MG TABS Take by mouth daily.   No current facility-administered medications on file prior to visit.    ROS: all  negative except what is noted in the HPI.   Physical Exam:  BP 136/86   Pulse 91   Temp 97.7 F (36.5 C)   Ht '5\' 4"'$  (1.626 m)   Wt 145 lb 3.2 oz (65.9 kg)   SpO2 99%   BMI 24.92 kg/m   General Appearance: NAD.  Awake, conversant and cooperative. Eyes: PERRLA, EOMs intact.  Sclera white.  Conjunctiva without erythema. Sinuses: No frontal/maxillary tenderness.  No nasal discharge. Nares patent.  ENT/Mouth: Ext aud canals clear.  Bilateral TMs w/DOL and without erythema or bulging. Hearing intact.  Posterior pharynx without swelling or exudate.  Tonsils without swelling or erythema.  Neck: Supple.  No masses, nodules or thyromegaly. Respiratory: Effort is regular with non-labored breathing. Breath sounds are equal bilaterally without rales, rhonchi, wheezing or stridor.  Cardio: RRR with no MRGs. Brisk peripheral pulses without edema.  Abdomen: Active BS in all four quadrants.  Soft and non-tender without guarding, rebound tenderness, hernias or masses. Lymphatics: Non tender without lymphadenopathy.  Musculoskeletal: LROM in 3rd digit right hand.  Moderate edema and erythema localized around PIP joint, spreads proximally to 3rd metatarsal area.  LROM d/t pain.  Surrounding skin WNL. Skin: Appropriate color for ethnicity. Warm without rashes, lesions, ecchymosis, ulcers.  Neuro: CN II-XII grossly normal. Normal muscle tone without cerebellar symptoms and intact sensation.   Psych: AO X 3,  appropriate mood and affect, insight and judgment.     Darrol Jump, NP 9:08 AM [facility]

## 2022-03-22 NOTE — Patient Instructions (Signed)
Cellulitis, Adult  Cellulitis is a skin infection. The infected area is often warm, red, swollen, and sore. It occurs most often on the legs, feet, and toes, but can happen on any part of the body. This condition can be life-threatening without treatment. It is very important to get treated right away. What are the causes? This condition is caused by bacteria. The bacteria enter through a break in the skin, such as: A cut. A burn. A bug bite. An animal bite. An open sore. A crack. What increases the risk? Having a weak body's defense system (immune system). Being older than 82 years old. Having a blood sugar problem (diabetes). Having a long-term liver disease (cirrhosis) or kidney disease. Being very overweight (obese). Having a skin problem, such as: An itchy rash. A rash caused by a fungus. A rash with blisters. Slow movement of blood in the veins (venous stasis). Fluid buildup below the skin (edema). This condition is more likely to occur in people who: Have open cuts, burns, bites, or scrapes on the skin. Have been treated with high-energy rays (radiation). Use IV drugs. What are the signs or symptoms? Skin that: Looks red or purple, or slightly darker than your usual skin color. Has streaks. Has spots. Is swollen. Is sore or painful when you touch it. Is warm. A fever. Chills. Blisters. Tiredness (fatigue). How is this treated? Medicines to treat infections or allergies. Rest. Placing cold or warm cloths on the skin. Staying in the hospital, if the condition is very bad. You may need medicines through an IV. Follow these instructions at home: Medicines Take over-the-counter and prescription medicines only as told by your doctor. If you were prescribed antibiotics, take them as told by your doctor. Do not stop using them even if you start to feel better. General instructions Drink enough fluid to keep your pee (urine) pale yellow. Do not touch or rub the  infected area. Raise (elevate) the infected area above the level of your heart while you are sitting or lying down. Return to your normal activities when your doctor says that it is safe. Place cold or warm cloths on the area as told by your doctor. Keep all follow-up visits. Your doctor will need to make sure that a more serious infection is not developing. Contact a doctor if: You have a fever. You do not start to get better after 1-2 days of treatment. Your bone or joint under the infected area starts to hurt after the skin has healed. Your infection comes back in the same area or another area. Signs of this may include: You have a swollen bump in the area. Your red area gets larger, turns dark in color, or hurts more. You have more fluid coming from the wound. Pus or a bad smell develops in your infected area. You have more pain. You feel sick and have muscle aches and weakness. You develop vomiting or watery poop that will not go away. Get help right away if: You see red streaks coming from the area. You notice the skin turns purple or black and falls off. These symptoms may be an emergency. Get help right away. Call 911. Do not wait to see if the symptoms will go away. Do not drive yourself to the hospital. This information is not intended to replace advice given to you by your health care provider. Make sure you discuss any questions you have with your health care provider. Document Revised: 08/28/2021 Document Reviewed: 08/28/2021 Elsevier Patient Education  [place].   Gout  Gout is painful swelling of your joints. Gout is a type of arthritis. It is caused by having too much uric acid in your body. Uric acid is a chemical that is made when your body breaks down substances called purines. If your body has too much uric acid, sharp crystals can form and build up in your joints. This causes pain and swelling. Gout attacks can happen quickly and be very painful (acute  gout). Over time, the attacks can affect more joints and happen more often (chronic gout). What are the causes? Gout is caused by too much uric acid in your blood. This can happen because: Your kidneys do not remove enough uric acid from your blood. Your body makes too much uric acid. You eat too many foods that are high in purines. These foods include organ meats, some seafood, and beer. Trauma or stress can bring on an attack. What increases the risk? Having a family history of gout. Being female and middle-aged. Being female and having gone through menopause. Having an organ transplant. Taking certain medicines. Having certain conditions, such as: Being very overweight (obese). Lead poisoning. Kidney disease. A skin condition called psoriasis. Other risks include: Losing weight too quickly. Not having enough water in the body (being dehydrated). Drinking alcohol, especially beer. Drinking beverages that are sweetened with a type of sugar called fructose. What are the signs or symptoms? An attack of acute gout often starts at night and usually happens in just one joint. The most common place is the big toe. Other joints that may be affected include joints of the feet, ankle, knee, fingers, wrist, or elbow. Symptoms may include: Very bad pain. Warmth. Swelling. Stiffness. Tenderness. The affected joint may be very painful to touch. Shiny, red, or purple skin. Chills and fever. Chronic gout may cause symptoms more often. More joints may be involved. You may also have white or yellow lumps (tophi) on your hands or feet or in other areas near your joints. How is this treated? Treatment for an acute attack may include medicines for pain and swelling, such as: NSAIDs, such as ibuprofen. Steroids taken by mouth or injected into a joint. Colchicine. This can be given by mouth or through an IV tube. Treatment to prevent future attacks may include: Taking small doses of NSAIDs or  colchicine daily. Using a medicine that reduces uric acid levels in your blood, such as allopurinol. Making changes to your diet. You may need to see a food expert (dietitian) about what to eat and drink to prevent gout. Follow these instructions at home: During a gout attack  If told, put ice on the painful area. To do this: Put ice in a plastic bag. Place a towel between your skin and the bag. Leave the ice on for 20 minutes, 2-3 times a day. Take off the ice if your skin turns bright red. This is very important. If you cannot feel pain, heat, or cold, you have a greater risk of damage to the area. Raise the painful joint above the level of your heart as often as you can. Rest the joint as much as possible. If the joint is in your leg, you may be given crutches. Follow instructions from your doctor about what you cannot eat or drink. Avoiding future gout attacks Eat a low-purine diet. Avoid foods and drinks such as: Liver. Kidney. Anchovies. Asparagus. Herring. Mushrooms. Mussels. Beer. Stay at a healthy weight. If you want to lose weight,  talk with your doctor. Do not lose weight too fast. Start or continue an exercise plan as told by your doctor. Eating and drinking Avoid drinks sweetened by fructose. Drink enough fluids to keep your pee (urine) pale yellow. If you drink alcohol: Limit how much you have to: 0-1 drink a day for women who are not pregnant. 0-2 drinks a day for men. Know how much alcohol is in a drink. In the [place] one drink equals one 12 oz bottle of beer (355 mL), one 5 oz glass of wine (148 mL), or one 1 oz glass of hard liquor (44 mL). General instructions Take over-the-counter and prescription medicines only as told by your doctor. Ask your doctor if you should avoid driving or using machines while you are taking your medicine. Return to your normal activities when your doctor says that it is safe. Keep all follow-up visits. Where to find more  information [employer] [facility] [employer]: www.niams.[employer].es Contact a doctor if: You have another gout attack. You still have symptoms of a gout attack after 10 days of treatment. You have problems (side effects) because of your medicines. You have chills or a fever. You have burning pain when you pee (urinate). You have pain in your lower back or belly. Get help right away if: You have very bad pain. Your pain cannot be controlled. You cannot pee. Summary Gout is painful swelling of the joints. The most common site of pain is the big toe, but it can affect other joints. Medicines and avoiding some foods can help to prevent and treat gout attacks. This information is not intended to replace advice given to you by your health care provider. Make sure you discuss any questions you have with your health care provider. Document Revised: 10/04/2020 Document Reviewed: 10/04/2020 Elsevier Patient Education  [place].

## 2022-03-23 LAB — CBC WITH DIFFERENTIAL/PLATELET
Absolute Monocytes: 616 cells/uL (ref 200–950)
Basophils Absolute: 30 cells/uL (ref 0–200)
Basophils Relative: 0.4 %
Eosinophils Absolute: 23 cells/uL (ref 15–500)
Eosinophils Relative: 0.3 %
HCT: 35.9 % (ref 35.0–45.0)
Hemoglobin: 11.8 g/dL (ref 11.7–15.5)
Lymphs Abs: 1026 cells/uL (ref 850–3900)
MCH: 29.7 pg (ref 27.0–33.0)
MCHC: 32.9 g/dL (ref 32.0–36.0)
MCV: 90.4 fL (ref 80.0–100.0)
MPV: 10 fL (ref 7.5–12.5)
Monocytes Relative: 8.1 %
Neutro Abs: 5905 cells/uL (ref 1500–7800)
Neutrophils Relative %: 77.7 %
Platelets: 275 10*3/uL (ref 140–400)
RBC: 3.97 10*6/uL (ref 3.80–5.10)
RDW: 12.2 % (ref 11.0–15.0)
Total Lymphocyte: 13.5 %
WBC: 7.6 10*3/uL (ref 3.8–10.8)

## 2022-03-23 LAB — URIC ACID: Uric Acid, Serum: 3.9 mg/dL (ref 2.5–7.0)

## 2022-05-16 NOTE — Progress Notes (Signed)
Future Appointments  Date Time Provider Department  05/17/2022 10:30 AM Lucky Cowboy, MD [facility]  08/19/2022            wellness  9:00 AM Raynelle Dick, NP [facility]  02/18/2023             cpe 11:00 AM Adela Glimpse, NP [facility]    History of Present Illness:       This very nice 82 y.o. DBF  presents for 3 month follow up with HTN, HLD, Pre-Diabetes and Vitamin D Deficiency.  Patient has hx/o Thyroiditis in 2011 apparently recovered and is monitored for incipient /indolent hypothyroidism.         Patient is treated for HTN  since  & BP has been controlled at home. Today's BP: 118/70.   Patient has had no complaints of any cardiac type chest pain, palpitations, dyspnea / orthopnea / PND, dizziness, claudication, or dependent edema.        Hyperlipidemia is not controlled with diet & meds. Patient denies myalgias or other med SE's. Last Lipids were not at goal :  Lab Results  Component Value Date   CHOL 230 (H) 02/15/2022   HDL 86 02/15/2022   LDLCALC 128 (H) 02/15/2022   TRIG 64 02/15/2022   CHOLHDL 2.7 02/15/2022     Also, the patient has history of PreDiabetes  (A1c 5.9% /2015) and has had no symptoms of reactive hypoglycemia, diabetic polys, paresthesias or visual blurring.  Last A1c was near goal :  Lab Results  Component Value Date   HGBA1C 5.8 (H) 02/15/2022                                                        Further, the patient also has history of Vitamin D Deficiency  ("38" /2015)  and supplements vitamin D . Last vitamin D was sl low  (goal 70-100) :  Lab Results  Component Value Date   VD25OH 53 02/15/2022     Current Outpatient Medications on File Prior to Visit  Medication Sig   VITAMIN D  5,000 Units  Take  daily.   colchicine 0.6 MG tablet Take  daily.   B-12 500 MCG TABS Take  daily.   famotidine40 MG tablet Take 1 tablet  every evening.    IRON  Take  daily.   Magnesium 250 MG TABS Take  daily.     Allergies  Allergen  Reactions   Prednisone Other (See Comments)    Hallucinations     PMHx:   Past Medical History:  Diagnosis Date   Anemia    Colon polyp    Diverticula of colon    Gastrointestinal ulcer due to Helicobacter pylori 02/18/2007   Overview:  Helicobacter Pylori (H. Pylori) Infection    GERD (gastroesophageal reflux disease)    Glaucoma    Hyperlipidemia    Hypertension    Thyroid disease      Immunization History  Administered Date(s) Administered   PFIZER Covid-19 Tri-Sucrose Vacc 04/23/2019, 05/18/2019   PFIZER SARS-COV-2  02/14/2020   Td 06/30/2012   Zoster Recombinat (Shingrix) 12/22/2019, 02/02/2020     Past Surgical History:  Procedure Laterality Date   CATARACT EXTRACTION Right 06/16/2017   Dr. Tawny Asal   CHOLECYSTECTOMY     TUBAL LIGATION  FHx:    Reviewed / unchanged   SHx:    Reviewed / unchanged    Systems Review:  Constitutional: Denies fever, chills, wt changes, headaches, insomnia, fatigue, night sweats, change in appetite. Eyes: Denies redness, blurred vision, diplopia, discharge, itchy, watery eyes.  ENT: Denies discharge, congestion, post nasal drip, epistaxis, sore throat, earache, hearing loss, dental pain, tinnitus, vertigo, sinus pain, snoring.  CV: Denies chest pain, palpitations, irregular heartbeat, syncope, dyspnea, diaphoresis, orthopnea, PND, claudication or edema. Respiratory: denies cough, dyspnea, DOE, pleurisy, hoarseness, laryngitis, wheezing.  Gastrointestinal: Denies dysphagia, odynophagia, heartburn, reflux, water brash, abdominal pain or cramps, nausea, vomiting, bloating, diarrhea, constipation, hematemesis, melena, hematochezia  or hemorrhoids. Genitourinary: Denies dysuria, frequency, urgency, nocturia, hesitancy, discharge, hematuria or flank pain. Musculoskeletal: Denies arthralgias, myalgias, stiffness, jt. swelling, pain, limping or strain/sprain.  Skin: Denies pruritus, rash, hives, warts, acne, eczema or change in skin  lesion(s). Neuro: No weakness, tremor, incoordination, spasms, paresthesia or pain. Psychiatric: Denies confusion, memory loss or sensory loss. Endo: Denies change in weight, skin or hair change.  Heme/Lymph: No excessive bleeding, bruising or enlarged lymph nodes.   Physical Exam  BP 118/70   Pulse 83   Temp 97.9 F (36.6 C)   Resp 16   Ht 5\' 4"  (1.626 m)   Wt 145 lb 6.4 oz (66 kg)   SpO2 96%   BMI 24.96 kg/m   Appears  well nourished, well groomed  and in no distress.  Eyes: PERRLA, EOMs, conjunctiva no swelling or erythema. Sinuses: No frontal/maxillary tenderness ENT/Mouth: EAC's clear, TM's nl w/o erythema, bulging. Nares clear w/o erythema, swelling, exudates. Oropharynx clear without erythema or exudates. Oral hygiene is good. Tongue normal, non obstructing. Hearing intact.  Neck: Supple. Thyroid not palpable. Car 2+/2+ without bruits, nodes or JVD. Chest: Respirations nl with BS clear & equal w/o rales, rhonchi, wheezing or stridor.  Cor: Heart sounds normal w/ regular rate and rhythm without sig. murmurs, gallops, clicks or rubs. Peripheral pulses normal and equal  without edema.  Abdomen: Soft & bowel sounds normal. Non-tender w/o guarding, rebound, hernias, masses or organomegaly.  Lymphatics: Unremarkable.  Musculoskeletal: Full ROM all peripheral extremities, joint stability, 5/5 strength and normal gait.  Skin: Warm, dry without exposed rashes, lesions or ecchymosis apparent.  Neuro: Cranial nerves intact, reflexes equal bilaterally. Sensory-motor testing grossly intact. Tendon reflexes grossly intact.  Pysch: Alert & oriented x 3.  Insight and judgement nl & appropriate. No ideations.   Assessment and Plan:  1. Essential hypertension  - Continue medication, monitor blood pressure at home.  - Continue DASH diet.  Reminder to go to the ER if any CP,  SOB, nausea, dizziness, severe HA, changes vision/speech.    - CBC with Differential/Platelet - COMPLETE  METABOLIC PANEL WITH GFR - Magnesium - TSH  2. H - Continue diet/meds, exercise,& lifestyle modifications.  - Continue monitor periodic cholesterol/liver & renal functions    yperlipidemia, mixed  - Lipid panel - TSH  3. Abnormal glucose  - Continue diet, exercise  - Lifestyle modifications.  - Monitor appropriate labs   - Hemoglobin A1c - Insulin, random  4. Vitamin D deficiency  - Continue supplementation   - VITAMIN D 25 Hydroxy   5. Medication management  - CBC with Differential/Platelet - COMPLETE METABOLIC PANEL WITH GFR - Magnesium - Lipid panel - TSH - Hemoglobin A1c - Insulin, random - VITAMIN D 25 Hydroxy         Discussed  regular exercise, BP monitoring, weight control  to achieve/maintain BMI less than 25 and discussed med and SE's. Recommended labs to assess /monitor clinical status .  I discussed the assessment and treatment plan with the patient. The patient was provided an opportunity to ask questions and all were answered. The patient agreed with the plan and demonstrated an understanding of the instructions.  I provided over 30 minutes of exam, counseling, chart review and  complex critical decision making.        The patient was advised to call back or seek an in-person evaluation if the symptoms worsen or if the condition fails to improve as anticipated.   Kirtland Bouchard, MD

## 2022-05-17 ENCOUNTER — Ambulatory Visit (INDEPENDENT_AMBULATORY_CARE_PROVIDER_SITE_OTHER): Payer: PPO | Admitting: Internal Medicine

## 2022-05-17 ENCOUNTER — Encounter: Payer: Self-pay | Admitting: Internal Medicine

## 2022-05-17 VITALS — BP 118/70 | HR 83 | Temp 97.9°F | Resp 16 | Ht 64.0 in | Wt 145.4 lb

## 2022-05-17 DIAGNOSIS — I10 Essential (primary) hypertension: Secondary | ICD-10-CM

## 2022-05-17 DIAGNOSIS — Z79899 Other long term (current) drug therapy: Secondary | ICD-10-CM | POA: Diagnosis not present

## 2022-05-17 DIAGNOSIS — E559 Vitamin D deficiency, unspecified: Secondary | ICD-10-CM

## 2022-05-17 DIAGNOSIS — R7309 Other abnormal glucose: Secondary | ICD-10-CM | POA: Diagnosis not present

## 2022-05-17 DIAGNOSIS — E782 Mixed hyperlipidemia: Secondary | ICD-10-CM | POA: Diagnosis not present

## 2022-05-17 NOTE — Patient Instructions (Addendum)
Due to recent changes in healthcare laws, you may see the results of your imaging and laboratory studies on MyChart before your provider has had a chance to review them.  We understand that in some cases there may be results that are confusing or concerning to you. Not all laboratory results come back in the same time frame and the provider may be waiting for multiple results in order to interpret others.  Please give [place] 48 hours in order for your provider to thoroughly review all the results before contacting the office for clarification of your results.  ++++++++++++++++++++++++++  - Cholesterol is too high - Recommend low cholesterol diet   - Cholesterol only comes from animal sources  - ie. meat, dairy, egg yolks  - Eat all the vegetables you want.  - Avoid Meat, Avoid Meat,  Avoid Meat - especially Red Meat - Beef AND Pork .  - Avoid cheese & dairy - milk & ice cream.     - Cheese is the most concentrated form of trans-fats which  is the worst thing to clog up our arteries.   - Veggie cheese is OK which can be found in the fresh  produce section at [employer] or [employer] or [employer]  ++++++++++++++++++++++++++  Vit D  & Vit C 1,000 mg   are recommended to help protect  against the Covid-19 and other Corona viruses.    Also it's recommended  to take  Zinc 50 mg  to help  protect against the Covid-19   and best place to get  is also on [email or web address]  and don't pay more than 6-8 cents /pill !   +++++++++++++++++++++++++++++++++++++++ Recommend Adult Low Dose Aspirin or  coated  Aspirin 81 mg daily  To reduce risk of Colon Cancer 40 %,  Skin Cancer 26 % ,  Melanoma 46%  and  Pancreatic cancer 60% +++++++++++++++++++++++++++++++++++++++++ Vitamin D goal  is between 70-100.  Please make sure that you are taking your Vitamin D as directed.  It is very important as a natural anti-inflammatory  helping hair, skin, and nails, as well as reducing stroke and heart attack  risk.  It helps your bones and helps with mood. It also decreases numerous cancer risks so please take it as directed.  Low Vit D is associated with a 200-300% higher risk for CANCER  and 200-300% higher risk for HEART   ATTACK  &  STROKE.   .....................................Marland Kitchen It is also associated with higher death rate at younger ages,  autoimmune diseases like Rheumatoid arthritis, Lupus, Multiple Sclerosis.    Also many other serious conditions, like depression, Alzheimer's Dementia, infertility, muscle aches, fatigue, fibromyalgia - just to name a few. +++++++++++++++++++++++++++++++++++++++++ Recommend the book "The END of DIETING" by Dr Monico Hoar  & the book "The END of DIABETES " by Dr Monico Hoar At [email or web address] - get book & Audio CD's    Being diabetic has a  300% increased risk for heart attack, stroke, cancer, and alzheimer- type vascular dementia. It is very important that you work harder with diet by avoiding all foods that are white. Avoid white rice (brown & wild rice is OK), white potatoes (sweetpotatoes in moderation is OK), White bread or wheat bread or anything made out of white flour like bagels, donuts, rolls, buns, biscuits, cakes, pastries, cookies, pizza crust, and pasta (made from white flour & egg whites) - vegetarian pasta or spinach or wheat pasta is OK. Multigrain breads like Arnold's or [employer], or multigrain  sandwich thins or flatbreads.  Diet, exercise and weight loss can reverse and cure diabetes in the early stages.  Diet, exercise and weight loss is very important in the control and prevention of complications of diabetes which affects every system in your body, ie. Brain - dementia/stroke, eyes - glaucoma/blindness, heart - heart attack/heart failure, kidneys - dialysis, stomach - gastric paralysis, intestines - malabsorption, nerves - severe painful neuritis, circulation - gangrene & loss of a leg(s), and finally cancer and Alzheimers.    I recommend  avoid fried & greasy foods,  sweets/candy, white rice (brown or wild rice or Quinoa is OK), white potatoes (sweet potatoes are OK) - anything made from white flour - bagels, doughnuts, rolls, buns, biscuits,white and wheat breads, pizza crust and traditional pasta made of white flour & egg white(vegetarian pasta or spinach or wheat pasta is OK).  Multi-grain bread is OK - like multi-grain flat bread or sandwich thins. Avoid alcohol in excess. Exercise is also important.    Eat all the vegetables you want - avoid meat, especially red meat and dairy - especially cheese.  Cheese is the most concentrated form of trans-fats which is the worst thing to clog up our arteries. Veggie cheese is OK which can be found in the fresh produce section at [employer] or [employer] or [employer]  +++++++++++++++++++++++++++++++++++++++ DASH Eating Plan  DASH stands for "Dietary Approaches to Stop Hypertension."   The DASH eating plan is a healthy eating plan that has been shown to reduce high blood pressure (hypertension). Additional health benefits may include reducing the risk of type 2 diabetes mellitus, heart disease, and stroke. The DASH eating plan may also help with weight loss. WHAT DO I NEED TO KNOW ABOUT THE DASH EATING PLAN? For the DASH eating plan, you will follow these general guidelines: Choose foods with a percent daily value for sodium of less than 5% (as listed on the food label). Use salt-free seasonings or herbs instead of table salt or sea salt. Check with your health care provider or pharmacist before using salt substitutes. Eat lower-sodium products, often labeled as "lower sodium" or "no salt added." Eat fresh foods. Eat more vegetables, fruits, and low-fat dairy products. Choose whole grains. Look for the word "whole" as the first word in the ingredient list. Choose fish  Limit sweets, desserts, sugars, and sugary drinks. Choose heart-healthy fats. Eat veggie cheese  Eat more  home-cooked food and less restaurant, buffet, and fast food. Limit fried foods. Cook foods using methods other than frying. Limit canned vegetables. If you do use them, rinse them well to decrease the sodium. When eating at a restaurant, ask that your food be prepared with less salt, or no salt if possible.                      WHAT FOODS CAN I EAT? Read Dr Francis Dowse Fuhrman's books on The End of Dieting & The End of Diabetes  Grains Whole grain or whole wheat bread. Brown rice. Whole grain or whole wheat pasta. Quinoa, bulgur, and whole grain cereals. Low-sodium cereals. Corn or whole wheat flour tortillas. Whole grain cornbread. Whole grain crackers. Low-sodium crackers.  Vegetables Fresh or frozen vegetables (raw, steamed, roasted, or grilled). Low-sodium or reduced-sodium tomato and vegetable juices. Low-sodium or reduced-sodium tomato sauce and paste. Low-sodium or reduced-sodium canned vegetables.   Fruits All fresh, canned (in natural juice), or frozen fruits.  Protein Products  All fish and seafood.  Dried beans, peas,  or lentils. Unsalted nuts and seeds. Unsalted canned beans.  Dairy Low-fat dairy products, such as skim or 1% milk, 2% or reduced-fat cheeses, low-fat ricotta or cottage cheese, or plain low-fat yogurt. Low-sodium or reduced-sodium cheeses.  Fats and Oils Tub margarines without trans fats. Light or reduced-fat mayonnaise and salad dressings (reduced sodium). Avocado. Safflower, olive, or canola oils. Natural peanut or almond butter.  Other Unsalted popcorn and pretzels. The items listed above may not be a complete list of recommended foods or beverages. Contact your dietitian for more options.  +++++++++++++++  WHAT FOODS ARE NOT RECOMMENDED? Grains/ White flour or wheat flour White bread. White pasta. White rice. Refined cornbread. Bagels and croissants. Crackers that contain trans fat.  Vegetables  Creamed or fried vegetables. Vegetables in a . Regular  canned vegetables. Regular canned tomato sauce and paste. Regular tomato and vegetable juices.  Fruits Dried fruits. Canned fruit in light or heavy syrup. Fruit juice.  Meat and Other Protein Products Meat in general - RED meat & White meat.  Fatty cuts of meat. Ribs, chicken wings, all processed meats as bacon, sausage, bologna, salami, fatback, hot dogs, bratwurst and packaged luncheon meats.  Dairy Whole or 2% milk, cream, half-and-half, and cream cheese. Whole-fat or sweetened yogurt. Full-fat cheeses or blue cheese. Non-dairy creamers and whipped toppings. Processed cheese, cheese spreads, or cheese curds.  Condiments Onion and garlic salt, seasoned salt, table salt, and sea salt. Canned and packaged gravies. Worcestershire sauce. Tartar sauce. Barbecue sauce. Teriyaki sauce. Soy sauce, including reduced sodium. Steak sauce. Fish sauce. Oyster sauce. Cocktail sauce. Horseradish. Ketchup and mustard. Meat flavorings and tenderizers. Bouillon cubes. Hot sauce. Tabasco sauce. Marinades. Taco seasonings. Relishes.  Fats and Oils Butter, stick margarine, lard, shortening and bacon fat. Coconut, palm kernel, or palm oils. Regular salad dressings.  Pickles and olives. Salted popcorn and pretzels.  The items listed above may not be a complete list of foods and beverages to avoid.

## 2022-05-18 ENCOUNTER — Other Ambulatory Visit: Payer: Self-pay | Admitting: Internal Medicine

## 2022-05-18 DIAGNOSIS — E782 Mixed hyperlipidemia: Secondary | ICD-10-CM

## 2022-05-18 MED ORDER — ROSUVASTATIN CALCIUM 10 MG PO TABS
ORAL_TABLET | ORAL | 3 refills | Status: DC
Start: 2022-05-18 — End: 2023-03-20

## 2022-05-18 NOTE — Progress Notes (Signed)
^<^<^<^<^<^<^<^<^<^<^<^<^<^<^<^<^<^<^<^<^<^<^<^<^<^<^<^<^<^<^<^<^<^<^<^<^ ^>^>^>^>^>^>^>^>^>^>^>>^>^>^>^>^>^>^>^>^>^>^>^>^>^>^>^>^>^>^>^>^>^>^>^>^>  - Mild chronic anemia is about the same & Stable  ^<^<^<^<^<^<^<^<^<^<^<^<^<^<^<^<^<^<^<^<^<^<^<^<^<^<^<^<^<^<^<^<^<^<^<^<^   - Total Chol = 234 is very high risk for Heart Attack /Stroke /Vascular Dementia     ( Ideal or Goal is less than 180 ! )  & - Bad /Dangerous LDL Chol =  128    - - >> Sitting on a time Bomb !     ( Ideal or Goal is less than 70 ! )   - Treating with meds to lower Cholesterol is treating the result                                          & NOT treating the cause  - The cause is Bad Diet !   - But need to go ahead & start meds until get on a better diet to try &                                                          reverse some of the Damage already done   - Read or listen to   Dr Gerri Spore 's book    " How Not to Die ! "    - Recommend a stricter plant based low cholesterol diet   - Cholesterol only comes from animal sources                                                                                           - ie. meat, dairy, egg yolks  - Eat all the vegetables you want.  - Avoid Meat, Avoid Meat , Avoid Meat  ! ! !                                                                         -especially red meat - Beef AND Pork  - Avoid cheese & dairy - milk & ice cream.   - Cheese is the most concentrated form of trans-fats which                                                                      is the worst thing to clog up our arteries.   - Veggie cheese is OK which can be found in  the fresh produce section at                                                                  [facility] or [employer] or  [employer] ^>^>^>^>^>^>^>^>^>^>^>^>^>^>^>^>^>^>^>^>^>^>^>^>^>^>^>^>^>^>^>^>^>^>^>^>^  - A1c = 5.7%  is STILL  elevated in the borderline and                                                               early or pre-diabetes range which has the same   300% increased risk for heart attack, stroke, cancer                                        and alzheimer- type vascular dementia as full blown diabetes.   But the good news is that diet, exercise with                                                        weight loss can cure the early diabetes at this point. ^>^>^>^>^>^>^>^>^>^>^>^>^>^>^>^>^>^>^>^>^>^>^>^>^>^>^>^>^>^>^>^>^>^>^>^>^  - Vitamin D = 48 is low   - Vitamin D goal is between 70-100.   - Please increase your Vitamin D 5,000 unit caps to 2 capsules = 10,000 units /day .   - It is very important as a natural anti-inflammatory and helping the  immune system protect against viral infections, like the Covid-19    helping hair, skin, and nails, as well as reducing stroke and  heart attack risk.   - It helps your bones and helps with mood.  - It also decreases numerous cancer risks so please  take it as directed.   - Low Vit D is associated with a 200-300% higher risk for  CANCER   and 200-300% higher risk for HEART   ATTACK  &  STROKE.    - It is also associated with higher death rate at younger ages,   autoimmune diseases like Rheumatoid arthritis, Lupus,  Multiple Sclerosis.     - Also many other serious conditions, like depression, Alzheimer's  Dementia, infertility, muscle aches, fatigue, fibromyalgia  ^>^>^>^>^>^>^>^>^>^>^>^>^>^>^>^>^>^>^>^>^>^>^>^>^>^>^>^>^>^>^>^>^>^>^>^>^  All Else - CBC - Kidneys - Electrolytes - Liver - Magnesium & Thyroid    - all  Normal / OK ^>^>^>^>^>^>^>^>^>^>^>^>^>^>^>^>^>^>^>^>^>^>^>^>^>^>^>^>^>^>^>^>^>^>^>^>^ ^>^>^>^>^>^>^>^>^>^>^>^>^>^>^>^>^>^>^>^>^>^>^>^>^>^>^>^>^>^>^>^>^>^>^>^>^

## 2022-05-20 LAB — CBC WITH DIFFERENTIAL/PLATELET
Absolute Monocytes: 207 cells/uL (ref 200–950)
Basophils Absolute: 18 cells/uL (ref 0–200)
Basophils Relative: 0.4 %
Eosinophils Absolute: 108 cells/uL (ref 15–500)
Eosinophils Relative: 2.4 %
HCT: 36.1 % (ref 35.0–45.0)
Hemoglobin: 11.6 g/dL — ABNORMAL LOW (ref 11.7–15.5)
Lymphs Abs: 1359 cells/uL (ref 850–3900)
MCH: 29.7 pg (ref 27.0–33.0)
MCHC: 32.1 g/dL (ref 32.0–36.0)
MCV: 92.3 fL (ref 80.0–100.0)
MPV: 9.6 fL (ref 7.5–12.5)
Monocytes Relative: 4.6 %
Neutro Abs: 2808 cells/uL (ref 1500–7800)
Neutrophils Relative %: 62.4 %
Platelets: 276 10*3/uL (ref 140–400)
RBC: 3.91 10*6/uL (ref 3.80–5.10)
RDW: 12.3 % (ref 11.0–15.0)
Total Lymphocyte: 30.2 %
WBC: 4.5 10*3/uL (ref 3.8–10.8)

## 2022-05-20 LAB — COMPLETE METABOLIC PANEL WITH GFR
AG Ratio: 1.4 (calc) (ref 1.0–2.5)
ALT: 9 U/L (ref 6–29)
AST: 19 U/L (ref 10–35)
Albumin: 4.3 g/dL (ref 3.6–5.1)
Alkaline phosphatase (APISO): 69 U/L (ref 37–153)
BUN/Creatinine Ratio: 14 (calc) (ref 6–22)
BUN: 14 mg/dL (ref 7–25)
CO2: 29 mmol/L (ref 20–32)
Calcium: 9.4 mg/dL (ref 8.6–10.4)
Chloride: 104 mmol/L (ref 98–110)
Creat: 1 mg/dL — ABNORMAL HIGH (ref 0.60–0.95)
Globulin: 3.1 g/dL (calc) (ref 1.9–3.7)
Glucose, Bld: 84 mg/dL (ref 65–99)
Potassium: 4.1 mmol/L (ref 3.5–5.3)
Sodium: 142 mmol/L (ref 135–146)
Total Bilirubin: 0.5 mg/dL (ref 0.2–1.2)
Total Protein: 7.4 g/dL (ref 6.1–8.1)
eGFR: 57 mL/min/{1.73_m2} — ABNORMAL LOW (ref >=60)

## 2022-05-20 LAB — HEMOGLOBIN A1C
Hgb A1c MFr Bld: 5.7 % of total Hgb — ABNORMAL HIGH (ref <5.7)
Mean Plasma Glucose: 117 mg/dL
eAG (mmol/L): 6.5 mmol/L

## 2022-05-20 LAB — VITAMIN D 25 HYDROXY (VIT D DEFICIENCY, FRACTURES): Vit D, 25-Hydroxy: 48 ng/mL (ref 30–100)

## 2022-05-20 LAB — LIPID PANEL
Cholesterol: 234 mg/dL — ABNORMAL HIGH (ref <200)
HDL: 87 mg/dL (ref >=50)
LDL Cholesterol (Calc): 128 mg/dL (calc) — ABNORMAL HIGH
Non-HDL Cholesterol (Calc): 147 mg/dL (calc) — ABNORMAL HIGH (ref <130)
Total CHOL/HDL Ratio: 2.7 (calc) (ref <5.0)
Triglycerides: 93 mg/dL (ref <150)

## 2022-05-20 LAB — INSULIN, RANDOM: Insulin: 12.5 u[IU]/mL

## 2022-05-20 LAB — MAGNESIUM: Magnesium: 2.2 mg/dL (ref 1.5–2.5)

## 2022-05-20 LAB — TSH: TSH: 0.65 mIU/L (ref 0.40–4.50)

## 2022-06-27 ENCOUNTER — Encounter: Payer: Self-pay | Admitting: Nurse Practitioner

## 2022-06-27 ENCOUNTER — Ambulatory Visit (INDEPENDENT_AMBULATORY_CARE_PROVIDER_SITE_OTHER): Payer: PPO | Admitting: Nurse Practitioner

## 2022-06-27 VITALS — BP 140/74 | HR 76 | Temp 97.9°F | Ht 64.0 in | Wt 144.6 lb

## 2022-06-27 DIAGNOSIS — R432 Parageusia: Secondary | ICD-10-CM

## 2022-06-27 DIAGNOSIS — R110 Nausea: Secondary | ICD-10-CM | POA: Diagnosis not present

## 2022-06-27 DIAGNOSIS — R42 Dizziness and giddiness: Secondary | ICD-10-CM

## 2022-06-27 DIAGNOSIS — R519 Headache, unspecified: Secondary | ICD-10-CM | POA: Diagnosis not present

## 2022-06-27 NOTE — Progress Notes (Signed)
Assessment and Plan:  Robin Zimmerman was seen today for an episodic visit.  Diagnoses and all order for this visit:  Sudden onset of severe headache Report to ER for any increase in stroke like symptoms, including HA, N/V, paralysis, difficulty speaking, trouble walking, confusion, vision changes, CP, heart palpitations, SOB, diaphoresis.  - CT HEAD WO CONTRAST ( ); Future  Dysgeusia  - CT HEAD WO CONTRAST ( ); Future  Dizziness Change positions slowly when moving. Contact guard when walking - request assistance as needed  - CT HEAD WO CONTRAST ( ); Future  Nausea  - CT HEAD WO CONTRAST ( ); Future   Notify office for further evaluation and treatment, questions or concerns if s/s fail to improve. The risks and benefits of my recommendations, as well as other treatment options were discussed with the patient today. Questions were answered.  Further disposition pending results of labs. Discussed med's effects and SE's.    Over 25 minutes of exam, counseling, chart review, and critical decision making was performed.   Future Appointments  Date Time Provider Department Center  08/19/2022  9:00 AM Raynelle Dick, NP [facility] None  02/18/2023 11:00 AM Latifah Padin, Archie Patten, NP [facility] None    ------------------------------------------------------------------------------------------------------------------   HPI BP (!) 140/74   Pulse 76   Temp 97.9 F (36.6 C)   Ht 5\' 4"  (1.626 m)   Wt 144 lb 9.6 oz (65.6 kg)   SpO2 99%   BMI 24.82 kg/m   82 y.o.female presents for evaluation of new onset HA x3 days ago.  Reports she woke up with an 8/10 HA, associated dizziness, blurry vision, heaviness to eyes and metallic taste in her mouth.  HA is concentrated in the frontal lobe.  She does not have a hx of migraines. She contributed it to the apartment upstairs exposure to marijuana smoke, however, reports that the tenants have lived there for "years" and her exposure  has been gradual, nothing new.  She has taken an Aleve with some improvement to HA.  Does not feel as though HA is as strong today, reports 6-7/10.  Reports overall she just feels "off."  Review of recent lab work 05/17/22 is stable.  BP is stable, although elevated in clinic today.  She denies CP, heart palpitations, SOB, syncope.  Neuro exam is normal.   Past Medical History:  Diagnosis Date   Anemia    Colon polyp    Diverticula of colon    Gastrointestinal ulcer due to Helicobacter pylori 02/18/2007   Overview:  Helicobacter Pylori (H. Pylori) Infection    GERD (gastroesophageal reflux disease)    Glaucoma    Hyperlipidemia    Hypertension    Thyroid disease      Allergies  Allergen Reactions   Prednisone Other (See Comments)    Hallucinations    Current Outpatient Medications on File Prior to Visit  Medication Sig   Cholecalciferol (VITAMIN D PO) Take 5,000 Int'l Units by mouth daily.   colchicine 0.6 MG tablet Take 0.6 mg by mouth daily.   Cyanocobalamin (B-12) 500 MCG TABS Take by mouth daily.   famotidine (PEPCID) 40 MG tablet Take 1 tablet (40 mg total) by mouth every evening. (Patient taking differently: Take 40 mg by mouth as needed.)   IRON PO Take by mouth daily.   Magnesium 250 MG TABS Take by mouth daily.   rosuvastatin (CRESTOR) 10 MG tablet Take 1 tablet Daily for Cholesterol   No current facility-administered medications on file prior to visit.  ROS: all negative except what is noted in the HPI.   Physical Exam:  BP (!) 140/74   Pulse 76   Temp 97.9 F (36.6 C)   Ht 5\' 4"  (1.626 m)   Wt 144 lb 9.6 oz (65.6 kg)   SpO2 99%   BMI 24.82 kg/m   General Appearance: NAD.  Awake, conversant and cooperative. Eyes: PERRLA, EOMs intact.  Sclera white.  Conjunctiva without erythema. Sinuses: No frontal/maxillary tenderness.  No nasal discharge. Nares patent.  ENT/Mouth: Ext aud canals clear.  Bilateral TMs w/DOL and without erythema or bulging. Hearing intact.   Posterior pharynx without swelling or exudate.  Tonsils without swelling or erythema.  Neck: Supple.  No masses, nodules or thyromegaly. Respiratory: Effort is regular with non-labored breathing. Breath sounds are equal bilaterally without rales, rhonchi, wheezing or stridor.  Cardio: RRR with no MRGs. Brisk peripheral pulses without edema.  Abdomen: Active BS in all four quadrants.  Soft and non-tender without guarding, rebound tenderness, hernias or masses. Lymphatics: Non tender without lymphadenopathy.  Musculoskeletal: Full ROM, 5/5 strength, normal ambulation.  No clubbing or cyanosis. Skin: Appropriate color for ethnicity. Warm without rashes, lesions, ecchymosis, ulcers.  Neuro: CN II-XII grossly normal. Normal muscle tone without cerebellar symptoms and intact sensation.   Psych: AO X 3,  appropriate mood and affect, insight and judgment.     Adela Glimpse, NP 11:30 AM [facility]

## 2022-06-27 NOTE — Patient Instructions (Signed)
Transient Ischemic Attack A transient ischemic attack (TIA) causes the same symptoms as a stroke, but the symptoms go away quickly. A TIA happens when blood flow to the brain is blocked. Having a TIA means you may be at risk for a stroke. A TIA is a medical emergency. What are the causes? A TIA is caused by a blocked artery in the head or neck. This means the brain does not get the blood supply it needs. A blockage can be caused by: Fatty buildup in an artery in the head or neck. A blood clot. A tear in an artery. Irritation and swelling (inflammation) of an artery. Sometimes the cause is not known. What increases the risk? Certain things may make you more likely to have a TIA. Some of these are things that you can change, such as: Using products that have nicotine or tobacco. Not being active. Drinking too much alcohol. Using recreational drugs. Health conditions that may increase your risk include: High blood pressure. High cholesterol. Diabetes. Heart disease. A heartbeat that is not regular (atrial fibrillation). Sickle cell disease. Problems with blood clotting. Other risk factors include: Being over the age of 60. Being female. Being very overweight. Sleep problems (sleep apnea). Having a family history of stroke. Having had blood clots, stroke, TIA, or heart attack in the past. What are the signs or symptoms? The symptoms of a TIA are like those of a stroke. They can include: Weakness or loss of feeling in your face, arm, or leg. This often happens on one side of your body. Trouble walking. Trouble moving your arms or legs. Trouble talking or understanding what people are saying. Problems with how you see. Feeling dizzy. Feeling confused. Loss of balance or coordination. Feeling like you may vomit (nausea) or vomiting. Having a very bad headache. If you can, note what time you started to have symptoms. Tell your doctor. How is this treated? The goal of treatment is to  lower the risk for a stroke. This may include: Changes to diet and lifestyle, such as getting regular exercise and stopping smoking. Taking medicines to: Thin the blood. Lower blood pressure. Lower cholesterol. Treating other health conditions, such as diabetes. If testing shows that an artery in your brain is narrow, your doctor may recommend a procedure to: Take the blockage out of your artery. Open or widen an artery in your neck (carotid angioplasty and stenting). Follow these instructions at home: Medicines Take over-the-counter and prescription medicines only as told by your doctor. If you were told to take aspirin or another medicine to thin your blood, use it exactly as told by your doctor. Taking too much of the medicine can cause bleeding. Taking too little of the medicine may not work to treat the problem. Eating and drinking  Eat 5 or more servings of fruits and vegetables each day. Follow instructions from your doctor about your diet. You may need to follow a certain diet to help lower your risk of a stroke. You may need to: Eat a diet that is low in fat and salt. Eat foods with a lot of fiber. Limit carbohydrates and sugar. If you drink alcohol: Limit how much you have to: 0-1 drink a day for women who are not pregnant. 0-2 drinks a day for men. Know how much alcohol is in a drink. In the [place] one drink equals one 12 oz bottle of beer (355 mL), one 5 oz glass of wine (148 mL), or one 1 oz glass of hard   liquor (44 mL). General instructions Keep a healthy weight. Try to get at least 30 minutes of exercise on most days. Get treatment if you have sleep problems. Do not smoke or use any products that contain nicotine or tobacco. If you need help quitting, ask your doctor. Do not use drugs. Keep all follow-up visits. Your doctor will want to know if you have any more symptoms and to check blood labs if any medicines were prescribed. Where to find more  information American Stroke Association: stroke.org Get help right away if: You have chest pain. You have a heartbeat that is not regular. You have any signs of a stroke. "BE FAST" is an easy way to remember the main warning signs: B - Balance. Dizziness, sudden trouble walking, or loss of balance. E - Eyes. Trouble seeing or a change in how you see. F - Face. Sudden weakness or loss of feeling of the face. The face or eyelid may droop on one side. A - Arms. Weakness or loss of feeling in an arm. This happens all of a sudden and most often on one side of the body. S - Speech. Sudden trouble speaking, slurred speech, or trouble understanding what people say. T - Time. Time to call emergency services. Write down what time symptoms started. You have other signs of a stroke, such as: A sudden, very bad headache with no known cause. Feeling like you may vomit. Vomiting. A seizure. These symptoms may be an emergency. Get help right away. Call 911. Do not wait to see if the symptoms will go away. Do not drive yourself to the hospital. This information is not intended to replace advice given to you by your health care provider. Make sure you discuss any questions you have with your health care provider. Document Revised: 06/15/2021 Document Reviewed: 06/15/2021 Elsevier Patient Education  2024 [employer].

## 2022-06-30 ENCOUNTER — Ambulatory Visit (HOSPITAL_BASED_OUTPATIENT_CLINIC_OR_DEPARTMENT_OTHER)
Admission: RE | Admit: 2022-06-30 | Discharge: 2022-06-30 | Disposition: A | Discharge status: Home / Self Care | Payer: PPO | Source: Ambulatory Visit | Attending: Nurse Practitioner | Admitting: Nurse Practitioner

## 2022-06-30 DIAGNOSIS — R42 Dizziness and giddiness: Secondary | ICD-10-CM | POA: Insufficient documentation

## 2022-06-30 DIAGNOSIS — R519 Headache, unspecified: Secondary | ICD-10-CM | POA: Insufficient documentation

## 2022-06-30 DIAGNOSIS — R110 Nausea: Secondary | ICD-10-CM | POA: Diagnosis not present

## 2022-06-30 DIAGNOSIS — R432 Parageusia: Secondary | ICD-10-CM | POA: Diagnosis not present

## 2022-07-10 DIAGNOSIS — H40013 Open angle with borderline findings, low risk, bilateral: Secondary | ICD-10-CM | POA: Diagnosis not present

## 2022-07-10 DIAGNOSIS — H2512 Age-related nuclear cataract, left eye: Secondary | ICD-10-CM | POA: Diagnosis not present

## 2022-07-10 DIAGNOSIS — H40003 Preglaucoma, unspecified, bilateral: Secondary | ICD-10-CM | POA: Diagnosis not present

## 2022-07-10 DIAGNOSIS — Z961 Presence of intraocular lens: Secondary | ICD-10-CM | POA: Diagnosis not present

## 2022-08-15 NOTE — Progress Notes (Signed)
CPE   Assessment:    Encounter for Annual Physical Exam with abnormal findings Due annually  Health Maintenance reviewed Healthy lifestyle reviewed and goals set DEXA UTD 02/14/21 T score -1.1 osteopenia Mammogram 09/07/21 negative   Hyperlipidemia, unspecified hyperlipidemia type Strong patient preference to avoid meds, admits could do better with diet/lifestyle, motivated to work on this Cntinue to discuss lifestyle changes - low saturated fat, high fiber, primarily plant based minimally processed Mediterranean type diet encouraged Consider fiber supplement  -     Lipid Profile  Essential hypertension -     CBC with Diff -     COMPLETE METABOLIC PANEL WITH GFR -     TSH - continue medications, DASH diet, exercise and monitor at home. Call if greater than 130/80.   Medication management -     Magnesium  Other abnormal glucose -     Hemoglobin A1c ([facility]) Discussed disease progression and risks Discussed diet/exercise, weight management and risk modification  Vitamin D deficiency -     Vitamin D (25 hydroxy)  Anemia, unspecified type monitor CBC, normal iron, colonoscopy B12 (last borderline)  Glaucoma, unspecified glaucoma type, unspecified laterality Continue follow up eye doctor  History of colon polyps Had colonoscopy 03/2020, none further recommended High fiber diet recommended  Gastroesophageal reflux disease, unspecified whether esophagitis present Well managed on current medications Discussed diet, avoiding triggers and other lifestyle change - Magnesium  BMI 23 Recommended diet heavy in fruits and veggies and low in animal meats, cheeses, and dairy products, appropriate calorie intake Patient will work on increasing activity, decreasing saturated fat and simple carbs Suggest silver sneakers classes or doing treadmill at the gym Follow up at next visit  Stage 3 CKD(HCC) Increase fluids, avoid NSAIDS, monitor sugars, will monitor   Medication  management -     CBC with Differential/Platelet -     COMPLETE METABOLIC PANEL WITH GFR -     Magnesium -     Lipid panel -     TSH -     Hemoglobin A1C w/out eAG -     VITAMIN D 25 Hydroxy (Vit-D Deficiency, Fractures) -     EKG 12-Lead -     Urinalysis, Routine w reflex microscopic -     Microalbumin / creatinine urine ratio -     Iron, TIBC and Ferritin Panel  Anemia, unspecified type - CBC -     Iron, TIBC and Ferritin Panel  Screening for ischemic heart disease -     EKG 12-Lead  Screening for hematuria or proteinuria -     Urinalysis, Routine w reflex microscopic -     Microalbumin / creatinine urine ratio  Screening for thyroid disorder -     TSH      Future Appointments  Date Time Provider Department Center  02/18/2023 11:00 AM Adela Glimpse, NP [facility] None  08/19/2023  9:00 AM Raynelle Dick, NP [facility] None     Subjective:   Robin Zimmerman is a non smoking 82 y.o. female who presents for CPE and 3 month follow up for HTN, chol, glucose management, vitamin D.   She has GERD, was having persistent throat clearing, improved on famotidine. Has only had to use intermittently  She has been noticing more fatigue and heaviness in her eyes. Recent eye exam was normal.  She is supposed to take iron for anemia nut has not been taking regularly. She has not been exercising as much either as her walking partner has been  diagnosed with COPD  BMI is Body mass index is 23.93 kg/m., she has been working on diet and exercise, admits less exercise with heat. She has been eating more fresh fruits and vegetables.  Drinking lots of water Wt Readings from Last 3 Encounters:  08/19/22 139 lb 6.4 oz (63.2 kg)  06/27/22 144 lb 9.6 oz (65.6 kg)  05/17/22 145 lb 6.4 oz (66 kg)   Her blood pressure has been controlled at home without medication, today their BP is BP: 102/60  BP Readings from Last 3 Encounters:  08/19/22 102/60  06/27/22 (!) 140/74  05/17/22 118/70   She does not workout, very sporadic.  She denies chest pain, shortness of breath, dizziness.     She is not on cholesterol medication and denies myalgias. Her cholesterol is not at goal. Her mother had hx of CVA in her 90s, patient does not have other risk factors and patient strongly prefers to avoid medications. The cholesterol last visit was:   Lab Results  Component Value Date   CHOL 234 (H) 05/17/2022   HDL 87 05/17/2022   LDLCALC 128 (H) 05/17/2022   TRIG 93 05/17/2022   CHOLHDL 2.7 05/17/2022   She has been working on diet and exercise for glucose management, and denies polydipsia, polyuria and visual disturbances. Last A1C in the office was:  Lab Results  Component Value Date   HGBA1C 5.7 (H) 05/17/2022   Patient is on Vitamin D supplement, 10000 IU daily. Lab Results  Component Value Date   VD25OH 48 05/17/2022   She has stable anemia, hgb in 11 range, predates 2015;      Latest Ref Rng & Units 05/17/2022   10:27 AM 03/22/2022    9:31 AM 02/15/2022    9:57 AM  CBC  WBC 3.8 - 10.8 Thousand/uL 4.5  7.6  3.3   Hemoglobin 11.7 - 15.5 g/dL 16.1  09.6  04.5   Hematocrit 35.0 - 45.0 % 36.1  35.9  35.1   Platelets 140 - 400 Thousand/uL 276  275  278    Lab Results  Component Value Date   IRON 77 02/15/2022   TIBC 255 02/15/2022   FERRITIN 264 02/15/2022   B12 has been low, patient is taking daily supplement, switched to 500 mcg SL Lab Results  Component Value Date   VITAMINB12 1,148 (H) 02/15/2022      Medication Review   Current Outpatient Medications (Cardiovascular):    rosuvastatin (CRESTOR) 10 MG tablet, Take 1 tablet Daily for Cholesterol (Patient not taking: Reported on 08/19/2022)   Current Outpatient Medications (Analgesics):    colchicine 0.6 MG tablet, Take 0.6 mg by mouth daily. (Patient not taking: Reported on 08/19/2022)  Current Outpatient Medications (Hematological):    Cyanocobalamin (B-12) 500 MCG TABS, Take by mouth daily.   IRON PO, Take by  mouth daily.  Current Outpatient Medications (Other):    Carboxymethylcellulose Sodium (EYE DROPS OP), Apply to eye. PRN   Cholecalciferol (VITAMIN D PO), Take 5,000 Int'l Units by mouth daily.   Magnesium 250 MG TABS, Take by mouth daily.   VITAMIN K PO, Take by mouth.   famotidine (PEPCID) 40 MG tablet, Take 1 tablet (40 mg total) by mouth every evening. (Patient taking differently: Take 40 mg by mouth as needed.)  Current Problems (verified) Patient Active Problem List   Diagnosis Date Noted   Osteopenia 02/14/2021   Bicytopenia 02/17/2020   B12 deficiency 02/17/2020   Seasonal allergies 10/18/2019   Absolute anemia 07/20/2014  Glaucoma 04/14/2014   Vitamin D deficiency 04/04/2013   Medication management 04/04/2013   Other abnormal glucose 01/19/2013   GERD (gastroesophageal reflux disease)    Hyperlipidemia    Hypertension    History of colon polyps 02/16/2007    Screening Tests Immunization History  Administered Date(s) Administered   PFIZER Comirnaty(Gray Top)Covid-19 Tri-Sucrose Vaccine 04/23/2019, 05/18/2019   PFIZER SARS-COV-2 Pediatric Vaccination 5-64yrs 02/14/2020   Td 06/30/2012   Zoster Recombinant(Shingrix) 12/22/2019, 02/02/2020    Health Maintenance  Topic Date Due   Pneumonia Vaccine 69+ Years old (1 of 1 - PCV) Never done   COVID-19 Vaccine (3 - Pfizer risk series) 03/13/2020   DTaP/Tdap/Td (2 - Tdap) 07/01/2022   INFLUENZA VACCINE  08/15/2022   DEXA SCAN  02/15/2023   Medicare Annual Wellness (AWV)  02/16/2023   Zoster Vaccines- Shingrix  Completed   HPV VACCINES  Aged Out     Names of Other Physician/Practitioners you currently use: 1. [facility]- here for primary care 2.  [facility], eye doctor, last visit 27/2024 3. Dr. ? , dental, last visit - last 02/2021  Patient Care Team: Lucky Cowboy, MD as PCP - General (Internal Medicine) Charna Elizabeth, MD as Consulting Physician  (Gastroenterology)   Allergies Allergies  Allergen Reactions   Prednisone Other (See Comments)    Hallucinations    SURGICAL HISTORY She  has a past surgical history that includes Tubal ligation; Cholecystectomy; and Cataract extraction (Right, 06/16/2017). FAMILY HISTORY Her family history includes Alzheimer's disease in her father; Bone cancer in her brother; Brain cancer in her sister; Cancer in her sister; Colon cancer in her brother and sister; Dementia (age of onset: 4) in her sister; Diabetes in her sister; Heart disease in her mother; Heart disease (age of onset: 65) in her brother; Prostate cancer in her brother; Throat cancer in her brother. SOCIAL HISTORY She  reports that she quit smoking about 57 years ago. Her smoking use included cigarettes. She started smoking about 62 years ago. She has a 1.3 pack-year smoking history. She has never used smokeless tobacco. She reports that she does not drink alcohol and does not use drugs.  Review of Systems  Constitutional:  Negative for malaise/fatigue and weight loss.  HENT:  Negative for hearing loss and tinnitus.        Eye heaviness.   Eyes:  Negative for blurred vision and double vision.  Respiratory:  Negative for cough, shortness of breath and wheezing.   Cardiovascular:  Negative for chest pain, palpitations, orthopnea, claudication and leg swelling.  Gastrointestinal:  Negative for abdominal pain, blood in stool, constipation, diarrhea, heartburn, melena, nausea and vomiting.  Genitourinary: Negative.        Stress incontinence  Musculoskeletal:  Positive for back pain (lumbar- intermittent) and neck pain (intermittent- stiff neck). Negative for falls, joint pain and myalgias.  Skin:  Negative for rash.  Neurological:  Negative for dizziness, tingling, sensory change, weakness and headaches.  Endo/Heme/Allergies:  Negative for polydipsia.  Psychiatric/Behavioral: Negative.    All other systems reviewed and are  negative.     Objective:     Blood pressure 102/60, pulse 80, temperature 97.7 F (36.5 C), height 5\' 4"  (1.626 m), weight 139 lb 6.4 oz (63.2 kg), SpO2 94%. Body mass index is 23.93 kg/m.  General appearance: alert, no distress, WD/WN,  female HEENT: normocephalic, sclerae anicteric, TMs pearly, nares patent, no discharge or erythema, pharynx normal Oral cavity: MMM, no lesions Neck: supple, no lymphadenopathy,  no thyromegaly, no masses Heart: RRR, normal S1, S2, murmur noted Lungs: CTA bilaterally, no wheezes, rhonchi, or rales Abdomen: +bs, soft, non-tender, non distended, no rebound, no masses, no hepatomegaly, no splenomegaly Musculoskeletal: full ROM, no swelling, no obvious deformity. Lateral tenderness over trochanter and surrounding musculature Extremities: no edema, no cyanosis, no clubbing Skin: Seb keratosis over back and AB- unchanged , multiple moles of face Pulses: 2+ symmetric, upper and lower extremities, normal cap refill Neurological: alert, oriented x 3, CN2-12 intact, strength normal upper extremities and lower extremities, sensation normal throughout, DTRs 2+ throughout, no cerebellar signs, gait normal Psychiatric: normal affect, behavior normal, pleasant  Breasts: defer mammogram UTD. GU: defer  EKG: NSR, no ST changes   Raynelle Dick, NP   08/19/2022

## 2022-08-19 ENCOUNTER — Ambulatory Visit (INDEPENDENT_AMBULATORY_CARE_PROVIDER_SITE_OTHER): Payer: PPO | Admitting: Nurse Practitioner

## 2022-08-19 ENCOUNTER — Encounter: Payer: Self-pay | Admitting: Nurse Practitioner

## 2022-08-19 VITALS — BP 102/60 | HR 80 | Temp 97.7°F | Ht 64.0 in | Wt 139.4 lb

## 2022-08-19 DIAGNOSIS — E559 Vitamin D deficiency, unspecified: Secondary | ICD-10-CM | POA: Diagnosis not present

## 2022-08-19 DIAGNOSIS — E663 Overweight: Secondary | ICD-10-CM | POA: Diagnosis not present

## 2022-08-19 DIAGNOSIS — Z1329 Encounter for screening for other suspected endocrine disorder: Secondary | ICD-10-CM | POA: Diagnosis not present

## 2022-08-19 DIAGNOSIS — Z1389 Encounter for screening for other disorder: Secondary | ICD-10-CM

## 2022-08-19 DIAGNOSIS — E782 Mixed hyperlipidemia: Secondary | ICD-10-CM

## 2022-08-19 DIAGNOSIS — Z0000 Encounter for general adult medical examination without abnormal findings: Secondary | ICD-10-CM

## 2022-08-19 DIAGNOSIS — Z136 Encounter for screening for cardiovascular disorders: Secondary | ICD-10-CM | POA: Diagnosis not present

## 2022-08-19 DIAGNOSIS — R7309 Other abnormal glucose: Secondary | ICD-10-CM | POA: Diagnosis not present

## 2022-08-19 DIAGNOSIS — Z79899 Other long term (current) drug therapy: Secondary | ICD-10-CM | POA: Diagnosis not present

## 2022-08-19 DIAGNOSIS — Z86010 Personal history of colonic polyps: Secondary | ICD-10-CM

## 2022-08-19 DIAGNOSIS — Z0001 Encounter for general adult medical examination with abnormal findings: Secondary | ICD-10-CM

## 2022-08-19 DIAGNOSIS — I10 Essential (primary) hypertension: Secondary | ICD-10-CM

## 2022-08-19 DIAGNOSIS — D649 Anemia, unspecified: Secondary | ICD-10-CM

## 2022-08-19 DIAGNOSIS — H409 Unspecified glaucoma: Secondary | ICD-10-CM

## 2022-08-19 DIAGNOSIS — K219 Gastro-esophageal reflux disease without esophagitis: Secondary | ICD-10-CM

## 2022-08-19 LAB — CBC WITH DIFFERENTIAL/PLATELET
Absolute Monocytes: 220 cells/uL (ref 200–950)
Basophils Absolute: 31 cells/uL (ref 0–200)
Basophils Relative: 1 %
Eosinophils Absolute: 90 cells/uL (ref 15–500)
Eosinophils Relative: 2.9 %
HCT: 35.4 % (ref 35.0–45.0)
Hemoglobin: 11.4 g/dL — ABNORMAL LOW (ref 11.7–15.5)
Lymphs Abs: 936 cells/uL (ref 850–3900)
MCH: 29.4 pg (ref 27.0–33.0)
MCHC: 32.2 g/dL (ref 32.0–36.0)
MCV: 91.2 fL (ref 80.0–100.0)
MPV: 10.2 fL (ref 7.5–12.5)
Monocytes Relative: 7.1 %
Neutro Abs: 1823 cells/uL (ref 1500–7800)
Neutrophils Relative %: 58.8 %
Platelets: 225 10*3/uL (ref 140–400)
RBC: 3.88 10*6/uL (ref 3.80–5.10)
RDW: 12.2 % (ref 11.0–15.0)
Total Lymphocyte: 30.2 %
WBC: 3.1 10*3/uL — ABNORMAL LOW (ref 3.8–10.8)

## 2022-08-19 NOTE — Patient Instructions (Signed)

## 2022-12-17 DIAGNOSIS — H40013 Open angle with borderline findings, low risk, bilateral: Secondary | ICD-10-CM | POA: Diagnosis not present

## 2022-12-17 DIAGNOSIS — H25812 Combined forms of age-related cataract, left eye: Secondary | ICD-10-CM | POA: Diagnosis not present

## 2023-02-18 ENCOUNTER — Ambulatory Visit: Payer: PPO | Admitting: Nurse Practitioner

## 2023-03-19 ENCOUNTER — Ambulatory Visit: Payer: PPO | Admitting: Nurse Practitioner

## 2023-03-20 ENCOUNTER — Encounter: Payer: Self-pay | Admitting: Family Medicine

## 2023-03-20 ENCOUNTER — Encounter: Payer: Self-pay | Admitting: *Deleted

## 2023-03-20 ENCOUNTER — Ambulatory Visit: Payer: PPO | Admitting: Family Medicine

## 2023-03-20 VITALS — BP 138/70 | HR 88 | Temp 98.1°F | Ht 65.0 in | Wt 141.8 lb

## 2023-03-20 DIAGNOSIS — R051 Acute cough: Secondary | ICD-10-CM | POA: Diagnosis not present

## 2023-03-20 DIAGNOSIS — E348 Other specified endocrine disorders: Secondary | ICD-10-CM

## 2023-03-20 DIAGNOSIS — N949 Unspecified condition associated with female genital organs and menstrual cycle: Secondary | ICD-10-CM | POA: Diagnosis not present

## 2023-03-20 DIAGNOSIS — R6889 Other general symptoms and signs: Secondary | ICD-10-CM | POA: Diagnosis not present

## 2023-03-20 DIAGNOSIS — M8580 Other specified disorders of bone density and structure, unspecified site: Secondary | ICD-10-CM | POA: Diagnosis not present

## 2023-03-20 DIAGNOSIS — Z7689 Persons encountering health services in other specified circumstances: Secondary | ICD-10-CM

## 2023-03-20 LAB — CBC WITH DIFFERENTIAL/PLATELET
Basophils Absolute: 0 10*3/uL (ref 0.0–0.1)
Basophils Relative: 0.5 % (ref 0.0–3.0)
Eosinophils Absolute: 0.1 10*3/uL (ref 0.0–0.7)
Eosinophils Relative: 2.9 % (ref 0.0–5.0)
HCT: 34.4 % — ABNORMAL LOW (ref 36.0–46.0)
Hemoglobin: 11.5 g/dL — ABNORMAL LOW (ref 12.0–15.0)
Lymphocytes Relative: 31.1 % (ref 12.0–46.0)
Lymphs Abs: 1.4 10*3/uL (ref 0.7–4.0)
MCHC: 33.3 g/dL (ref 30.0–36.0)
MCV: 91.9 fl (ref 78.0–100.0)
Monocytes Absolute: 0.3 10*3/uL (ref 0.1–1.0)
Monocytes Relative: 6.3 % (ref 3.0–12.0)
Neutro Abs: 2.7 10*3/uL (ref 1.4–7.7)
Neutrophils Relative %: 59.2 % (ref 43.0–77.0)
Platelets: 229 10*3/uL (ref 150.0–400.0)
RBC: 3.74 Mil/uL — ABNORMAL LOW (ref 3.87–5.11)
RDW: 13.1 % (ref 11.5–15.5)
WBC: 4.6 10*3/uL (ref 4.0–10.5)

## 2023-03-20 LAB — TSH: TSH: 0.68 u[IU]/mL (ref 0.35–5.50)

## 2023-03-20 MED ORDER — MUPIROCIN 2 % EX OINT: 1.0000 | TOPICAL_OINTMENT | Freq: Two times a day (BID) | CUTANEOUS | 0 refills | Status: AC

## 2023-03-20 NOTE — Progress Notes (Signed)
 New Patient Office Visit  Subjective   Patient ID: Robin Zimmerman, female    DOB: February 26, 1940  Age: 83 y.o. MRN: [id number]  CC:  Chief Complaint  Patient presents with   New Patient (Initial Visit)    HPI [name] presents to establish care with new provider.  Patients previous primary care was Dr. Lucky Cowboy with [facility]. Last seen in 08/2022.   Specialist: None   Patient reports her finger and toes are always cold. She reports a few months ago she noticed. Usually notices it when sitting, notices it every day. She reports she does have a history of anemia, but never had to have blood transfusion.   Patient reports she is experiencing a lingering cough that is productive. She reports she had a cold in January. She reports she took the Mucinex back in January. Then, a couple of weeks ago she had a scratchy throat, took some Mucinex. Denies chest pain and shortness of breath.   Patient reports she an area on her labia that is concerning. She reports it came up like a bump. She removed a scab and some blood came out of it. She reports she has been cleaning with alcohol.   Outpatient Encounter Medications as of 03/20/2023  Medication Sig   Cholecalciferol (VITAMIN D PO) Take 5,000 Int'l Units by mouth daily.   Cyanocobalamin (B-12) 500 MCG TABS Take by mouth daily.   IRON PO Take by mouth daily.   Magnesium 250 MG TABS Take by mouth daily.   mupirocin ointment (BACTROBAN) 2 % Apply 1 Application topically 2 (two) times daily for 10 days.   VITAMIN K PO Take by mouth.   [DISCONTINUED] Carboxymethylcellulose Sodium (EYE DROPS OP) Apply to eye. PRN   [DISCONTINUED] colchicine 0.6 MG tablet Take 0.6 mg by mouth daily. (Patient not taking: Reported on 08/19/2022)   [DISCONTINUED] famotidine (PEPCID) 40 MG tablet Take 1 tablet (40 mg total) by mouth every evening. (Patient taking differently: Take 40 mg by mouth as needed.)    [DISCONTINUED] rosuvastatin (CRESTOR) 10 MG tablet Take 1 tablet Daily for Cholesterol (Patient not taking: Reported on 08/19/2022)   No facility-administered encounter medications on file as of 03/20/2023.    Past Medical History:  Diagnosis Date   Anemia    Colon polyp    Diverticula of colon    Diverticulitis    Gastrointestinal ulcer due to Helicobacter pylori 02/18/2007   Overview:  Helicobacter Pylori (H. Pylori) Infection    GERD (gastroesophageal reflux disease)    Hyperlipidemia    Hypertension    Thyroid disease     Past Surgical History:  Procedure Laterality Date   CATARACT EXTRACTION Right 06/16/2017   Dr. Tawny Asal   CHOLECYSTECTOMY     TUBAL LIGATION      Family History  Problem Relation Age of Onset   Heart disease Mother    Alzheimer's disease Father    Diabetes Sister    Cancer Sister    Brain cancer Sister    Colon cancer Sister    Dementia Sister 95   Heart disease Brother 70   Bone cancer Brother    Colon cancer Brother    Prostate cancer Brother    Throat cancer Brother     Social History   Socioeconomic History   Marital status: Divorced    Spouse name: Not on file   Number of children: 1   Years of education: Not on file  Highest education level: Bachelor's degree (e.g., BA, AB, BS)  Occupational History   Not on file  Tobacco Use   Smoking status: Former    Current packs/day: 0.00    Average packs/day: 0.3 packs/day for 5.0 years (1.3 ttl pk-yrs)    Types: Cigarettes    Start date: 44    Quit date: 58    Years since quitting: 58.2   Smokeless tobacco: Never  Vaping Use   Vaping status: Never Used  Substance and Sexual Activity   Alcohol use: No    Alcohol/week: 0.0 standard drinks of alcohol   Drug use: No   Sexual activity: Not Currently    Partners: Male    Birth control/protection: Post-menopausal  Other Topics Concern   Not on file  Social History Narrative   Not on file   Social Drivers of Health   Financial  Resource Strain: Low Risk  (03/20/2023)   Overall Financial Resource Strain (CARDIA)    Difficulty of Paying Living Expenses: Not hard at all  Food Insecurity: No Food Insecurity (03/20/2023)   Hunger Vital Sign    Worried About Running Out of Food in the Last Year: Never true    Ran Out of Food in the Last Year: Never true  Transportation Needs: No Transportation Needs (03/20/2023)   PRAPARE - Administrator, Civil Service (Medical): No    Lack of Transportation (Non-Medical): No  Physical Activity: Inactive (03/20/2023)   Exercise Vital Sign    Days of Exercise per Week: 0 days    Minutes of Exercise per Session: 0 min  Stress: No Stress Concern Present (03/20/2023)   [employer] of Occupational Health - Occupational Stress Questionnaire    Feeling of Stress : Not at all  Social Connections: Moderately Isolated (03/20/2023)   Social Connection and Isolation Panel [NHANES]    Frequency of Communication with Friends and Family: More than three times a week    Frequency of Social Gatherings with Friends and Family: Once a week    Attends Religious Services: More than 4 times per year    Active Member of [employer] or Organizations: No    Attends Banker Meetings: Never    Marital Status: Divorced  Catering manager Violence: Unknown (03/20/2023)   Humiliation, Afraid, Rape, and Kick questionnaire    Fear of Current or Ex-Partner: No    Emotionally Abused: Not on file    Physically Abused: Not on file    Sexually Abused: Not on file    ROS See HPI above    Objective  BP 138/70 (BP Location: Left Arm, Patient Position: Sitting, Cuff Size: Normal)   Pulse 88   Temp 98.1 F (36.7 C) (Oral)   Ht 5\' 5"  (1.651 m)   Wt 141 lb 12.8 oz (64.3 kg)   SpO2 95%   BMI 23.60 kg/m   Physical Exam Vitals reviewed. Exam conducted with a chaperone present.  Constitutional:      General: She is not in acute distress.    Appearance: Normal appearance. She is not ill-appearing,  toxic-appearing or diaphoretic.  HENT:     Head: Normocephalic and atraumatic.  Eyes:     General:        Right eye: No discharge.        Left eye: No discharge.     Conjunctiva/sclera: Conjunctivae normal.  Cardiovascular:     Rate and Rhythm: Normal rate and regular rhythm.     Heart sounds: Normal heart sounds.  No murmur heard.    No friction rub. No gallop.  Pulmonary:     Effort: Pulmonary effort is normal. No respiratory distress.     Breath sounds: Normal breath sounds.  Genitourinary:    Labia:        Right: Lesion (Single raised area with no discharge, no erythema at the base.) present.   Musculoskeletal:        General: Normal range of motion.  Skin:    General: Skin is warm and dry.  Neurological:     General: No focal deficit present.     Mental Status: She is alert and oriented to person, place, and time. Mental status is at baseline.     Gait: Gait normal.  Psychiatric:        Mood and Affect: Mood normal.        Behavior: Behavior normal.        Thought Content: Thought content normal.        Judgment: Judgment normal.      Assessment & Plan:  Cold intolerance -     CBC with Differential/Platelet -     Iron, TIBC and Ferritin Panel -     TSH  Acute cough  Genital lesion, female -     Mupirocin; Apply 1 Application topically 2 (two) times daily for 10 days.  Dispense: 20 g; Refill: 0  Osteopenia, unspecified location -     DG Bone Density; Future  Estradiol deficiency -     DG Bone Density; Future  Encounter to establish care   1.Review health maintenance:  -Covid booster vaccine: Declines  -Bone density scan: 2023 osteopenia; Ordered  -Influenza vaccine: Declines  -Medicare Annual Wellness: Declines  -PNA vaccine: Declines  2.Ordered labs (CBC, Iron Profile, and TSH) for complaints of cold intolerance at her feet and hands. Office will call with lab results. 3.On physical exam, lungs are clear. Offered to prescribed an inhaler, cough syrup,  or capsules to help with lingering cough. She reports she will use some Mucinex. Advised if she changes your mind about an inhaler, cough syrup, or tablet, please call back to the office. 4.Recommend to cleanse your genital area twice a day with Dial Antibacterial Soap, pat dry, and apply the prescribed Bactroban to the area. If not improved or becomes worse please follow up.  -Follow up in 6 months for a physical.  Return in about 6 months (around 09/20/2023).   Zandra Abts, NP

## 2023-03-20 NOTE — Patient Instructions (Addendum)
-  It was nice to meet you and look forward to taking care of you. -Ordered labs. Office will call with lab results. -On your physical exam, your lungs are clear. If you change your mind about an inhaler or cough syrup or tablet, please call back to the office. -Recommend to cleanse your genital area twice a day with Dial Antibacterial Soap, pat dry, and apply the prescribed Bactroban to the area. If not improved or becomes worse please follow up. -Ordered bone density scan. Please call back to the office if you do not receive a phone call about an appointment in 2 weeks.  -Follow up in 6 months for a physical.

## 2023-03-21 LAB — IRON,TIBC AND FERRITIN PANEL
%SAT: 22 % (ref 16–45)
Ferritin: 233 ng/mL (ref 16–288)
Iron: 59 ug/dL (ref 45–160)
TIBC: 264 ug/dL (ref 250–450)

## 2023-04-01 ENCOUNTER — Ambulatory Visit (HOSPITAL_BASED_OUTPATIENT_CLINIC_OR_DEPARTMENT_OTHER)
Admission: RE | Admit: 2023-04-01 | Discharge: 2023-04-01 | Disposition: A | Discharge status: Home / Self Care | Source: Ambulatory Visit | Attending: Family Medicine | Admitting: Family Medicine

## 2023-04-01 DIAGNOSIS — E348 Other specified endocrine disorders: Secondary | ICD-10-CM | POA: Insufficient documentation

## 2023-04-01 DIAGNOSIS — M8588 Other specified disorders of bone density and structure, other site: Secondary | ICD-10-CM | POA: Insufficient documentation

## 2023-04-01 DIAGNOSIS — M8580 Other specified disorders of bone density and structure, unspecified site: Secondary | ICD-10-CM | POA: Insufficient documentation

## 2023-04-01 DIAGNOSIS — Z780 Asymptomatic menopausal state: Secondary | ICD-10-CM | POA: Diagnosis not present

## 2023-06-25 DIAGNOSIS — Z01818 Encounter for other preprocedural examination: Secondary | ICD-10-CM | POA: Diagnosis not present

## 2023-06-25 DIAGNOSIS — H40013 Open angle with borderline findings, low risk, bilateral: Secondary | ICD-10-CM | POA: Diagnosis not present

## 2023-06-25 DIAGNOSIS — H25812 Combined forms of age-related cataract, left eye: Secondary | ICD-10-CM | POA: Diagnosis not present

## 2023-06-25 DIAGNOSIS — H2512 Age-related nuclear cataract, left eye: Secondary | ICD-10-CM | POA: Diagnosis not present

## 2023-07-10 ENCOUNTER — Encounter: Payer: Self-pay | Admitting: Family Medicine

## 2023-07-10 ENCOUNTER — Ambulatory Visit: Admitting: Family Medicine

## 2023-07-10 DIAGNOSIS — Z0000 Encounter for general adult medical examination without abnormal findings: Secondary | ICD-10-CM

## 2023-07-10 NOTE — Progress Notes (Signed)
 PATIENT CHECK-IN and HEALTH RISK ASSESSMENT QUESTIONNAIRE:  -completed by phone/video for upcoming Medicare Preventive Visit  -PLEASE SELECT NOT IN PERSON for the method of visit.   Pre-Visit Check-in: 1)Vitals (height, wt, BP, etc) - record in vitals section for visit on day of visit Request home vitals (wt, BP, etc.) and enter into vitals, THEN update Vital Signs SmartPhrase below at the top of the HPI. See below.  2)Review and Update Medications, Allergies PMH, Surgeries, Social history in Epic 3)Hospitalizations in the last year with date/reason? no  4)Review and Update Care Team (patient's specialists) in Epic 5) Complete PHQ9 in Epic  6) Complete Fall Screening in Epic 7)Review all Health Maintenance Due and order under PCP if not done.  Medicare Wellness Patient Questionnaire:  Answer theses question about your habits: How often do you have a drink containing alcohol? N/a How many drinks containing alcohol do you have on a typical day when you are drinking?n/a How often do you have six or more drinks on one occasion?n/a Have you ever smoked?Yes  Quit date if applicable?  56 years ago  How many packs a day do/did you smoke? 1/2 Do you use smokeless tobacco?no Do you use an illicit drugs?no On average, how many days per week do you engage in moderate to strenuous exercise (like a brisk walk)?0 On average, how many minutes do you engage in exercise at this level?0 Eats mostly at home, feels gets plenty of veggies and proteins Typical breakfast: Varies, eggs and veggies, broccoli Typical lunch: Varies, greek yogurt Typical dinner: Varies, cabbage, carrots, beets, broccoli, flour, yogurt and eggs Typical snacks: Cake and Ice cream every now and then as a treat, but tries not to eat a lot of sweets, sometimes dark chocolate, nuts  Beverages:  Coffee and tea   Answer theses question about your everyday activities: Can you perform most household chores? Yes  Are you deaf or have  significant trouble hearing? No Do you feel that you have a problem with memory? No Do you feel safe at home? Yes  Last dentist visit? 1 year ago  8. Do you have any difficulty performing your everyday activities? no Are you having any difficulty walking, taking medications on your own, and or difficulty managing daily home needs? no Do you have difficulty walking or climbing stairs? no Do you have difficulty dressing or bathing? no Do you have difficulty doing errands alone such as visiting a doctor's office or shopping? no Do you currently have any difficulty preparing food and eating? no Do you currently have any difficulty using the toilet? no Do you have any difficulty managing your finances?no Do you have any difficulties with housekeeping of managing your housekeeping?no   Do you have Advanced Directives in place (Living Will, Healthcare Power or Attorney)? no   Last eye Exam and location? 1 week ago - Robin Blonder   Do you currently use prescribed or non-prescribed narcotic or opioid pain medications? No   Do you have a history or close family history of breast, ovarian, tubal or peritoneal cancer or a family member with BRCA (breast cancer susceptibility 1 and 2) gene mutations? no   Nurse/Assistant Credentials/time stamp: MG 9:46     ----------------------------------------------------------------------------------------------------------------------------------------------------------------------------------------------------------------------  Because this visit was a virtual/telehealth visit, some criteria may be missing or patient reported. Any vitals not documented were not able to be obtained and vitals that have been documented are patient reported.    MEDICARE ANNUAL PREVENTIVE VISIT WITH PROVIDER: (Welcome to Medicare, initial  annual wellness or annual wellness exam)  Virtual Visit via Video Note  I connected with Robin Zimmerman on 07/10/23 by a  video enabled telemedicine application and verified that I am speaking with the correct person using two identifiers.  Location patient: home Location provider:work or home office Persons participating in the virtual visit: patient, provider  Concerns and/or follow up today: stable, has cataract surgery next monday   See HM section in Epic for other details of completed HM.    ROS: negative for report of fevers, unintentional weight loss, chest pain, sob, hemoptysis, melena, hematochezia, hematuria, falls, bleeding or bruising, thoughts of suicide or self harm, memory loss  Patient-completed extensive health risk assessment - reviewed and discussed with the patient: See Health Risk Assessment completed with patient prior to the visit either above or in recent phone note. This was reviewed in detailed with the patient today and appropriate recommendations, orders and referrals were placed as needed per Summary below and patient instructions.   Review of Medical History: -PMH, PSH, Family History and current specialty and care providers reviewed and updated and listed below   Patient Care Team: Robin Philippe SAUNDERS, NP as PCP - General (Family Medicine) Robin Lamprey, MD as Consulting Physician (Gastroenterology)   Past Medical History:  Diagnosis Date   Anemia    Colon polyp    Diverticula of colon    Diverticulitis    Gastrointestinal ulcer due to Helicobacter pylori 02/18/2007   Overview:  Helicobacter Pylori (H. Pylori) Infection    GERD (gastroesophageal reflux disease)    Hyperlipidemia    Hypertension    Thyroid  disease     Past Surgical History:  Procedure Laterality Date   CATARACT EXTRACTION Right 06/16/2017   Dr. Chrissie   CHOLECYSTECTOMY     TUBAL LIGATION      Social History   Socioeconomic History   Marital status: Divorced    Spouse name: Not on file   Number of children: 1   Years of education: Not on file   Highest education level: Bachelor's degree  (e.g., BA, AB, BS)  Occupational History   Not on file  Tobacco Use   Smoking status: Former    Current packs/day: 0.00    Average packs/day: 0.3 packs/day for 5.0 years (1.3 ttl pk-yrs)    Types: Cigarettes    Start date: 64    Quit date: 32    Years since quitting: 58.5   Smokeless tobacco: Never  Vaping Use   Vaping status: Never Used  Substance and Sexual Activity   Alcohol use: No    Alcohol/week: 0.0 standard drinks of alcohol   Drug use: No   Sexual activity: Not Currently    Partners: Male    Birth control/protection: Post-menopausal  Other Topics Concern   Not on file  Social History Narrative   Not on file   Social Drivers of Health   Financial Resource Strain: Low Risk  (07/10/2023)   Overall Financial Resource Strain (CARDIA)    Difficulty of Paying Living Expenses: Not hard at all  Food Insecurity: No Food Insecurity (07/10/2023)   Hunger Vital Sign    Worried About Running Out of Food in the Last Year: Never true    Ran Out of Food in the Last Year: Never true  Transportation Needs: No Transportation Needs (07/10/2023)   PRAPARE - Administrator, Civil Service (Medical): No    Lack of Transportation (Non-Medical): No  Physical Activity: Inactive (07/10/2023)  Exercise Vital Sign    Days of Exercise per Week: 0 days    Minutes of Exercise per Session: 0 min  Stress: No Stress Concern Present (07/10/2023)   [employer] of Occupational Health - Occupational Stress Questionnaire    Feeling of Stress: Not at all  Social Connections: Moderately Isolated (07/10/2023)   Social Connection and Isolation Panel    Frequency of Communication with Friends and Family: More than three times a week    Frequency of Social Gatherings with Friends and Family: Once a week    Attends Religious Services: More than 4 times per year    Active Member of [employer] or Organizations: No    Attends Banker Meetings: Never    Marital Status: Divorced   Catering manager Violence: Unknown (07/10/2023)   Humiliation, Afraid, Rape, and Kick questionnaire    Fear of Current or Ex-Partner: No    Emotionally Abused: Not on file    Physically Abused: Not on file    Sexually Abused: Not on file    Family History  Problem Relation Age of Onset   Heart disease Mother    Alzheimer's disease Father    Diabetes Sister    Cancer Sister    Brain cancer Sister    Colon cancer Sister    Dementia Sister 95   Heart disease Brother 64   Bone cancer Brother    Colon cancer Brother    Prostate cancer Brother    Throat cancer Brother     Current Outpatient Medications on File Prior to Visit  Medication Sig Dispense Refill   Cholecalciferol (VITAMIN D  PO) Take 5,000 Int'l Units by mouth daily.     Cyanocobalamin  (B-12) 500 MCG TABS Take by mouth daily.     IRON PO Take by mouth daily.     Magnesium 250 MG TABS Take by mouth daily.     VITAMIN K PO Take by mouth.     No current facility-administered medications on file prior to visit.    Allergies  Allergen Reactions   Prednisone  Other (See Comments)    Hallucinations       Physical Exam Vitals requested from patient and listed below if patient had equipment and was able to obtain at home for this virtual visit: There were no vitals filed for this visit. Estimated body mass index is 23.6 kg/m as calculated from the following:   Height as of 03/20/23: 5' 5 (1.651 m).   Weight as of 03/20/23: 141 lb 12.8 oz (64.3 kg).  EKG (optional): deferred due to virtual visit  GENERAL: alert, oriented, no acute distress detected, full vision exam deferred due to pandemic and/or virtual encounter  HEENT: atraumatic, conjunttiva clear, no obvious abnormalities on inspection of external nose and ears  NECK: normal movements of the head and neck  LUNGS: on inspection no signs of respiratory distress, breathing rate appears normal, no obvious gross SOB, gasping or wheezing  CV: no obvious  cyanosis  MS: moves all visible extremities without noticeable abnormality  PSYCH/NEURO: pleasant and cooperative, no obvious depression or anxiety, speech and thought processing grossly intact, Cognitive function grossly intact  Flowsheet Row Clinical Support from 07/10/2023 in [facility]  PHQ-9 Total Score 0        07/10/2023    9:40 AM 03/20/2023    1:29 PM 02/15/2022   10:02 AM 02/15/2021    9:55 AM 08/17/2020    9:23 AM  Depression screen PHQ 2/9  Decreased Interest 0 0 0 0 0  Down, Depressed, Hopeless 0 0 0 0 0  PHQ - 2 Score 0 0 0 0 0  Altered sleeping 0      Tired, decreased energy 0      Change in appetite 0      Feeling bad or failure about yourself  0      Trouble concentrating 0      Moving slowly or fidgety/restless 0      Suicidal thoughts 0      PHQ-9 Score 0      Difficult doing work/chores Not difficult at all           02/16/2020    9:46 AM 02/15/2021    9:54 AM 02/15/2022   10:02 AM 03/20/2023    1:29 PM 07/10/2023    9:40 AM  Fall Risk  Falls in the past year? 1 0 0 0 0  Was there an injury with Fall? 0 0  0 0  Fall Risk Category Calculator 1 0  0 0  Fall Risk Category (Retired) Low  Low      (RETIRED) Patient Fall Risk Level Low fall risk  Low fall risk      Patient at Risk for Falls Due to No Fall Risks No Fall Risks  No Fall Risks No Fall Risks  Fall risk Follow up Falls evaluation completed;Falls prevention discussed  Falls prevention discussed;Falls evaluation completed   Falls evaluation completed Falls evaluation completed     Data saved with a previous flowsheet row definition     SUMMARY AND PLAN:  Encounter for Medicare annual wellness exam   Discussed applicable health maintenance/preventive health measures and advised and referred or ordered per patient preferences: -she agrees to call and schedule her mammogram, reports has the reminder letter but is waiting until after had cataract surgery next week -discussed  vaccines due recs/risks, advised can get at the pharmacy and to let us  know/provide proof of receipt when does so that we can update her record Health Maintenance  Topic Date Due   Pneumococcal Vaccine: 50+ Years (1 of 1 - PCV) Never done   COVID-19 Vaccine (3 - Pfizer risk series) 03/13/2020   DTaP/Tdap/Td (2 - Tdap) 07/01/2022   INFLUENZA VACCINE  08/15/2023   Medicare Annual Wellness (AWV)  07/09/2024   DEXA SCAN  03/31/2025   Zoster Vaccines- Shingrix  Completed   Hepatitis B Vaccines  Aged Out   HPV VACCINES  Aged Out   Meningococcal B Vaccine  Aged Out   Education and counseling on the following was provided based on the above review of health and a plan/checklist for the patient, along with additional information discussed, was provided for the patient in the patient instructions :  -Advised on importance of completing advanced directives, discussed options for completing and provided information in patient instructions as well -Advised and counseled on a healthy lifestyle - including the importance of a healthy diet, regular physical activity, social connections  -Reviewed patient's current diet. Advised and counseled on a whole foods based healthy diet. A summary of a healthy diet was provided in the Patient Instructions.  -reviewed patient's current physical activity level and discussed exercise guidelines for adults. Discussed community resources and ideas for safe exercise at home to assist in meeting exercise guideline recommendations in a safe and healthy way. She likes to walk but does not like to walk alone. Discussed the walk with a doc program and walking while watching TV,  or walking in a store.  -Advise yearly dental visits at minimum and regular eye exams   Follow up: see patient instructions     Patient Instructions  I really enjoyed getting to talk with you today! I am available on Tuesdays and Thursdays for virtual visits if you have any questions or concerns, or  if I can be of any further assistance.   CHECKLIST FROM ANNUAL WELLNESS VISIT:  -Follow up (please call to schedule if not scheduled after visit):   -yearly for annual wellness visit with primary care office  Here is a list of your preventive care/health maintenance measures and the plan for each if any are due:  PLAN For any measures below that may be due:    1. Please call to schedule you mammogram asap   2. You can get your vaccines at the pharmacy - if/when you do please let us  know and provide proof of receipt so that we can update your immunization record.  Health Maintenance  Topic Date Due   Pneumococcal Vaccine: 50+ Years (1 of 1 - PCV) Never done   COVID-19 Vaccine (3 - Pfizer risk series) 03/13/2020   DTaP/Tdap/Td (2 - Tdap) 07/01/2022   Medicare Annual Wellness (AWV)  02/16/2023   INFLUENZA VACCINE  08/15/2023   DEXA SCAN  03/31/2025   Zoster Vaccines- Shingrix  Completed   Hepatitis B Vaccines  Aged Out   HPV VACCINES  Aged Out   Meningococcal B Vaccine  Aged Out    -See a dentist at least yearly  -Get your eyes checked and then per your eye specialist's recommendations  -Other issues addressed today:   1. Please complete advanced directives. Further information provided below at the end of this document.   -I have included below further information regarding a healthy whole foods based diet, physical activity guidelines for adults, stress management and opportunities for social connections. I hope you find this information useful.   -----------------------------------------------------------------------------------------------------------------------------------------------------------------------------------------------------------------------------------------------------------    NUTRITION: -eat real food: lots of colorful vegetables (half the plate) and fruits -5-7 servings of vegetables and fruits per day (fresh or steamed is best), exp. 2 servings of  vegetables with lunch and dinner and 2 servings of fruit per day. Berries and greens such as kale and collards are great choices.  -consume on a regular basis:  fresh fruits, fresh veggies, fish, nuts, seeds, healthy oils (such as olive oil, avocado oil), whole grains (make sure for bread/pasta/crackers/etc., that the first ingredient on label contains the word whole), legumes. -can eat small amounts of dairy and lean meat (no larger than the palm of your hand), but avoid processed meats such as ham, bacon, lunch meat, etc. -drink water -try to avoid fast food and pre-packaged foods, processed meat, ultra processed foods/beverages (donuts, candy, etc.) -most experts advise limiting sodium to < 2300mg  per day, should limit further is any chronic conditions such as high blood pressure, heart disease, diabetes, etc. The American Heart Association advised that < 1500mg  is is ideal -try to avoid foods/beverages that contain any ingredients with names you do not recognize  -try to avoid foods/beverages  with added sugar or sweeteners/sweets  -try to avoid sweet drinks (including diet drinks): soda, juice, Gatorade, sweet tea, power drinks, diet drinks -try to avoid white rice, white bread, pasta (unless whole grain)  EXERCISE GUIDELINES FOR ADULTS: -if you wish to increase your physical activity, do so gradually and with the approval of your doctor -STOP and seek medical care immediately if you  have any chest pain, chest discomfort or trouble breathing when starting or increasing exercise  -move and stretch your body, legs, feet and arms when sitting for long periods -Physical activity guidelines for optimal health in adults: -get at least 150 minutes per week of moderate exercise (can talk, but not sing); this is about 20-30 minutes of sustained activity 5-7 days per week or two 10-15 minute episodes of sustained activity 5-7 days per week -do some muscle building/resistance training/strength training  at least 2 days per week  -balance exercises 3+ days per week:   Stand somewhere where you have something sturdy to hold onto if you lose balance    1) lift up on toes, then back down, start with 5x per day and work up to 20x   2) stand and lift one leg straight out to the side so that foot is a few inches of the floor, start with 5x each side and work up to 20x each side   3) stand on one foot, start with 5 seconds each side and work up to 20 seconds on each side  If you need ideas or help with getting more active:  -Silver sneakers https://tools.silversneakers.com  -Walk with a Doc: http://www.duncan-williams.com/  -try to include resistance (weight lifting/strength building) and balance exercises twice per week: or the following link for ideas: http://castillo-powell.com/  BuyDucts.dk  STRESS MANAGEMENT: -can try meditating, or just sitting quietly with deep breathing while intentionally relaxing all parts of your body for 5 minutes daily -if you need further help with stress, anxiety or depression please follow up with your primary doctor or contact the wonderful folks at [facility]: [phone]  SOCIAL CONNECTIONS: -options in [place] if you wish to engage in more social and exercise related activities:  -Silver sneakers https://tools.silversneakers.com  -Walk with a Doc: http://www.duncan-williams.com/  -Check out the [facility] Active Adults 50+ section on the [employer] (hiking clubs, book clubs, cards and games, chess, exercise classes, aquatic classes and much more) - see the website for details: https://www.[place]-.gov/departments/parks-recreation/active-adults50  -YouTube has lots of exercise videos for different ages and abilities as well  -[facility] (a variety of indoor and outdoor inperson activities for adults). [phone]. [address]  -Virtual Online Classes (a variety of topics): see seniorplanet.org or call [phone]  -consider volunteering at a school, hospice center, church, senior center or elsewhere    ADVANCED HEALTHCARE DIRECTIVES:  [place] Advanced Directives assistance:   ExpressWeek.com.cy  Everyone should have advanced health care directives in place. This is so that you get the care you want, should you ever be in a situation where you are unable to make your own medical decisions.   From the  Advanced Directive Website: Advance Health Care Directives are legal documents in which you give written instructions about your health care if, in the future, you cannot speak for yourself.   A health care power of attorney allows you to name a person you trust to make your health care decisions if you cannot make them yourself. A declaration of a desire for a natural death (or living will) is document, which states that you desire not to have your life prolonged by extraordinary measures if you have a terminal or incurable illness or if you are in a vegetative state. An advance instruction for mental health treatment makes a declaration of instructions, information and preferences regarding your mental health treatment. It also states that you are aware that the advance instruction authorizes  a mental health treatment provider to act according to your wishes. It may also outline your consent or refusal of mental health treatment. A declaration of an anatomical gift allows anyone over the age of 48 to make a gift by will, organ donor card or other document.   Please see the following website or an elder law attorney for forms, FAQs and for completion of advanced directives: [place]  Print production planner Health Care Directives Advance Health Care Directives (http://guzman.[email or web address]/)  Or copy and paste the following to your web  browser: PoshChat.fi          Chiquita JONELLE Cramp, DO

## 2023-07-10 NOTE — Progress Notes (Signed)
 Patient unable to obtain vital signs due to telehealth visit

## 2023-07-10 NOTE — Patient Instructions (Addendum)
 I really enjoyed getting to talk with you today! I am available on Tuesdays and Thursdays for virtual visits if you have any questions or concerns, or if I can be of any further assistance.   CHECKLIST FROM ANNUAL WELLNESS VISIT:  -Follow up (please call to schedule if not scheduled after visit):   -yearly for annual wellness visit with primary care office  Here is a list of your preventive care/health maintenance measures and the plan for each if any are due:  PLAN For any measures below that may be due:    1. Please call to schedule you mammogram asap   2. You can get your vaccines at the pharmacy - if/when you do please let us  know and provide proof of receipt so that we can update your immunization record.  Health Maintenance  Topic Date Due   Pneumococcal Vaccine: 50+ Years (1 of 1 - PCV) Never done   COVID-19 Vaccine (3 - Pfizer risk series) 03/13/2020   DTaP/Tdap/Td (2 - Tdap) 07/01/2022   Medicare Annual Wellness (AWV)  02/16/2023   INFLUENZA VACCINE  08/15/2023   DEXA SCAN  03/31/2025   Zoster Vaccines- Shingrix  Completed   Hepatitis B Vaccines  Aged Out   HPV VACCINES  Aged Out   Meningococcal B Vaccine  Aged Out    -See a dentist at least yearly  -Get your eyes checked and then per your eye specialist's recommendations  -Other issues addressed today:   1. Please complete advanced directives. Further information provided below at the end of this document.   -I have included below further information regarding a healthy whole foods based diet, physical activity guidelines for adults, stress management and opportunities for social connections. I hope you find this information useful.   -----------------------------------------------------------------------------------------------------------------------------------------------------------------------------------------------------------------------------------------------------------    NUTRITION: -eat real food:  lots of colorful vegetables (half the plate) and fruits -5-7 servings of vegetables and fruits per day (fresh or steamed is best), exp. 2 servings of vegetables with lunch and dinner and 2 servings of fruit per day. Berries and greens such as kale and collards are great choices.  -consume on a regular basis:  fresh fruits, fresh veggies, fish, nuts, seeds, healthy oils (such as olive oil, avocado oil), whole grains (make sure for bread/pasta/crackers/etc., that the first ingredient on label contains the word whole), legumes. -can eat small amounts of dairy and lean meat (no larger than the palm of your hand), but avoid processed meats such as ham, bacon, lunch meat, etc. -drink water -try to avoid fast food and pre-packaged foods, processed meat, ultra processed foods/beverages (donuts, candy, etc.) -most experts advise limiting sodium to < 2300mg  per day, should limit further is any chronic conditions such as high blood pressure, heart disease, diabetes, etc. The American Heart Association advised that < 1500mg  is is ideal -try to avoid foods/beverages that contain any ingredients with names you do not recognize  -try to avoid foods/beverages  with added sugar or sweeteners/sweets  -try to avoid sweet drinks (including diet drinks): soda, juice, Gatorade, sweet tea, power drinks, diet drinks -try to avoid white rice, white bread, pasta (unless whole grain)  EXERCISE GUIDELINES FOR ADULTS: -if you wish to increase your physical activity, do so gradually and with the approval of your doctor -STOP and seek medical care immediately if you have any chest pain, chest discomfort or trouble breathing when starting or increasing exercise  -move and stretch your body, legs, feet and arms when sitting for long periods -Physical activity  guidelines for optimal health in adults: -get at least 150 minutes per week of moderate exercise (can talk, but not sing); this is about 20-30 minutes of sustained activity  5-7 days per week or two 10-15 minute episodes of sustained activity 5-7 days per week -do some muscle building/resistance training/strength training at least 2 days per week  -balance exercises 3+ days per week:   Stand somewhere where you have something sturdy to hold onto if you lose balance    1) lift up on toes, then back down, start with 5x per day and work up to 20x   2) stand and lift one leg straight out to the side so that foot is a few inches of the floor, start with 5x each side and work up to 20x each side   3) stand on one foot, start with 5 seconds each side and work up to 20 seconds on each side  If you need ideas or help with getting more active:  -Silver sneakers https://tools.silversneakers.com  -Walk with a Doc: http://www.duncan-williams.com/  -try to include resistance (weight lifting/strength building) and balance exercises twice per week: or the following link for ideas: http://castillo-powell.com/  BuyDucts.dk  STRESS MANAGEMENT: -can try meditating, or just sitting quietly with deep breathing while intentionally relaxing all parts of your body for 5 minutes daily -if you need further help with stress, anxiety or depression please follow up with your primary doctor or contact the wonderful folks at [facility]: [phone]  SOCIAL CONNECTIONS: -options in [place] if you wish to engage in more social and exercise related activities:  -Silver sneakers https://tools.silversneakers.com  -Walk with a Doc: http://www.duncan-williams.com/  -Check out the [facility] Active Adults 50+ section on the [place] of [employer] (hiking clubs, book clubs, cards and games, chess, exercise classes, aquatic classes and much more) - see the website for details: https://www.[place].gov/departments/parks-recreation/active-adults50  -YouTube has lots of exercise videos for  different ages and abilities as well  -[facility] (a variety of indoor and outdoor inperson activities for adults). [phone]. [address].  -Virtual Online Classes (a variety of topics): see seniorplanet.org or call [phone]  -consider volunteering at a school, hospice center, church, senior center or elsewhere    ADVANCED HEALTHCARE DIRECTIVES:  [place] Advanced Directives assistance:   ExpressWeek.com.cy  Everyone should have advanced health care directives in place. This is so that you get the care you want, should you ever be in a situation where you are unable to make your own medical decisions.   From the [place] Advanced Directive Website: Advance Health Care Directives are legal documents in which you give written instructions about your health care if, in the future, you cannot speak for yourself.   A health care power of attorney allows you to name a person you trust to make your health care decisions if you cannot make them yourself. A declaration of a desire for a natural death (or living will) is document, which states that you desire not to have your life prolonged by extraordinary measures if you have a terminal or incurable illness or if you are in a vegetative state. An advance instruction for mental health treatment makes a declaration of instructions, information and preferences regarding your mental health treatment. It also states that you are aware that the advance instruction authorizes a mental health treatment provider to act according to your wishes. It may also outline your consent or refusal of mental health treatment. A declaration of an anatomical gift allows anyone  over the age of 55 to make a gift by will, organ donor card or other document.   Please see the following website or an elder law attorney for forms, FAQs and for completion of advanced directives: Media   Print production planner Health Care Directives Advance Health Care Directives ([email or web address]/)  Or copy and paste the following to your web browser: [email or web address]

## 2023-07-14 DIAGNOSIS — H25812 Combined forms of age-related cataract, left eye: Secondary | ICD-10-CM | POA: Diagnosis not present

## 2023-07-14 DIAGNOSIS — H2512 Age-related nuclear cataract, left eye: Secondary | ICD-10-CM | POA: Diagnosis not present

## 2023-08-19 ENCOUNTER — Encounter: Payer: PPO | Admitting: Nurse Practitioner

## 2023-09-19 ENCOUNTER — Ambulatory Visit: Payer: Self-pay | Admitting: Family Medicine

## 2023-09-19 ENCOUNTER — Encounter: Payer: Self-pay | Admitting: Family Medicine

## 2023-09-19 ENCOUNTER — Ambulatory Visit (INDEPENDENT_AMBULATORY_CARE_PROVIDER_SITE_OTHER): Admitting: Family Medicine

## 2023-09-19 VITALS — BP 122/74 | HR 75 | Temp 98.1°F | Ht 64.0 in | Wt 141.0 lb

## 2023-09-19 DIAGNOSIS — I10 Essential (primary) hypertension: Secondary | ICD-10-CM | POA: Diagnosis not present

## 2023-09-19 DIAGNOSIS — R7309 Other abnormal glucose: Secondary | ICD-10-CM

## 2023-09-19 DIAGNOSIS — Z0001 Encounter for general adult medical examination with abnormal findings: Secondary | ICD-10-CM

## 2023-09-19 DIAGNOSIS — E785 Hyperlipidemia, unspecified: Secondary | ICD-10-CM

## 2023-09-19 DIAGNOSIS — E538 Deficiency of other specified B group vitamins: Secondary | ICD-10-CM | POA: Diagnosis not present

## 2023-09-19 DIAGNOSIS — Z1329 Encounter for screening for other suspected endocrine disorder: Secondary | ICD-10-CM | POA: Diagnosis not present

## 2023-09-19 DIAGNOSIS — R5383 Other fatigue: Secondary | ICD-10-CM | POA: Diagnosis not present

## 2023-09-19 DIAGNOSIS — D649 Anemia, unspecified: Secondary | ICD-10-CM

## 2023-09-19 DIAGNOSIS — E559 Vitamin D deficiency, unspecified: Secondary | ICD-10-CM

## 2023-09-19 DIAGNOSIS — M545 Low back pain, unspecified: Secondary | ICD-10-CM

## 2023-09-19 DIAGNOSIS — Z0000 Encounter for general adult medical examination without abnormal findings: Secondary | ICD-10-CM

## 2023-09-19 LAB — CBC WITH DIFFERENTIAL/PLATELET
Basophils Absolute: 0 K/uL (ref 0.0–0.1)
Basophils Relative: 0.6 % (ref 0.0–3.0)
Eosinophils Absolute: 0.1 K/uL (ref 0.0–0.7)
Eosinophils Relative: 2.1 % (ref 0.0–5.0)
HCT: 33.6 % — ABNORMAL LOW (ref 36.0–46.0)
Hemoglobin: 11.1 g/dL — ABNORMAL LOW (ref 12.0–15.0)
Lymphocytes Relative: 26.9 % (ref 12.0–46.0)
Lymphs Abs: 0.9 K/uL (ref 0.7–4.0)
MCHC: 33.2 g/dL (ref 30.0–36.0)
MCV: 89.7 fl (ref 78.0–100.0)
Monocytes Absolute: 0.2 K/uL (ref 0.1–1.0)
Monocytes Relative: 6.8 % (ref 3.0–12.0)
Neutro Abs: 2.2 K/uL (ref 1.4–7.7)
Neutrophils Relative %: 63.6 % (ref 43.0–77.0)
Platelets: 232 K/uL (ref 150.0–400.0)
RBC: 3.74 Mil/uL — ABNORMAL LOW (ref 3.87–5.11)
RDW: 12.9 % (ref 11.5–15.5)
WBC: 3.4 K/uL — ABNORMAL LOW (ref 4.0–10.5)

## 2023-09-19 LAB — COMPREHENSIVE METABOLIC PANEL WITH GFR
ALT: 9 U/L (ref 0–35)
AST: 17 U/L (ref 0–37)
Albumin: 4 g/dL (ref 3.5–5.2)
Alkaline Phosphatase: 61 U/L (ref 39–117)
BUN: 16 mg/dL (ref 6–23)
CO2: 30 meq/L (ref 19–32)
Calcium: 9.2 mg/dL (ref 8.4–10.5)
Chloride: 102 meq/L (ref 96–112)
Creatinine, Ser: 0.94 mg/dL (ref 0.40–1.20)
GFR: 56.35 mL/min — ABNORMAL LOW (ref >60.00)
Glucose, Bld: 77 mg/dL (ref 70–99)
Potassium: 3.8 meq/L (ref 3.5–5.1)
Sodium: 140 meq/L (ref 135–145)
Total Bilirubin: 0.5 mg/dL (ref 0.2–1.2)
Total Protein: 7.3 g/dL (ref 6.0–8.3)

## 2023-09-19 LAB — TSH: TSH: 0.45 u[IU]/mL (ref 0.35–5.50)

## 2023-09-19 LAB — VITAMIN B12: Vitamin B-12: 965 pg/mL — ABNORMAL HIGH (ref 211–911)

## 2023-09-19 LAB — LIPID PANEL
Cholesterol: 201 mg/dL — ABNORMAL HIGH (ref 0–200)
HDL: 83.5 mg/dL (ref >39.00)
LDL Cholesterol: 107 mg/dL — ABNORMAL HIGH (ref 0–99)
NonHDL: 117.68
Total CHOL/HDL Ratio: 2
Triglycerides: 53 mg/dL (ref 0.0–149.0)
VLDL: 10.6 mg/dL (ref 0.0–40.0)

## 2023-09-19 LAB — HEMOGLOBIN A1C: Hgb A1c MFr Bld: 6 % (ref 4.6–6.5)

## 2023-09-19 LAB — VITAMIN D 25 HYDROXY (VIT D DEFICIENCY, FRACTURES): VITD: 46.62 ng/mL (ref 30.00–100.00)

## 2023-09-19 NOTE — Progress Notes (Signed)
 Complete physical exam  Patient: Robin Zimmerman   DOB: 1940-03-30   83 y.o. Female  MRN: [id number]  Subjective:    Chief Complaint  Patient presents with   Annual Exam   Robin Zimmerman is a 83 y.o. female who presents today for a complete physical exam. She reports consuming a general diet. The patient does not participate in regular exercise at present. She generally feels well. She reports sleeping fairly well. She does have additional problems to discuss today.   She has been having pain in her lower back. It started a couple weeks ago. Intermittent. No tingling/numbness in LE. No loss of bowel/bladder function. Is not taking anything for the pain. Feels more pain when she gets up after sitting. She does not want to take anything for the pain. She has plans to increase activity levels by getting out of the house and walking. She believes this will help relieve her pain.  She reports feeling tired a lot despite not doing much.  Reports no recent falls. Lives with niece.   Most recent fall risk assessment:    09/19/2023    8:39 AM  Fall Risk   Falls in the past year? 0  Number falls in past yr: 0  Injury with Fall? 0  Risk for fall due to : No Fall Risks  Follow up Falls evaluation completed   Most recent depression screenings:    09/19/2023    8:39 AM 07/10/2023    9:40 AM  PHQ 2/9 Scores  PHQ - 2 Score 0 0  PHQ- 9 Score 0 0   Vision:Within last year. Last eye exam was July 2025. A cataract was removed then. Last dentist seen 1 -2 years ago.  Past Medical History:  Diagnosis Date   Anemia    Colon polyp    Diverticula of colon    Diverticulitis    Gastrointestinal ulcer due to Helicobacter pylori 02/18/2007   Overview:  Helicobacter Pylori (H. Pylori) Infection    GERD (gastroesophageal reflux disease)    Hyperlipidemia    Hypertension    Thyroid  disease    Patient Care Team: Billy Philippe SAUNDERS, NP as PCP - General (Family Medicine)    Outpatient Medications Prior to Visit  Medication Sig   Cholecalciferol (VITAMIN D  PO) Take 5,000 Int'l Units by mouth daily.   Cyanocobalamin  (B-12) 500 MCG TABS Take by mouth daily.   IRON PO Take by mouth daily.   Magnesium 250 MG TABS Take by mouth daily.   VITAMIN K PO Take by mouth.   No facility-administered medications prior to visit.   Review of Systems  Constitutional:  Positive for malaise/fatigue.  HENT: Negative.    Eyes: Negative.   Respiratory: Negative.    Cardiovascular: Negative.   Gastrointestinal: Negative.   Genitourinary: Negative.   Musculoskeletal:  Positive for back pain.  Skin: Negative.   Neurological: Negative.   Psychiatric/Behavioral: Negative.     See HPI above    Objective:     BP 122/74   Pulse 75   Temp 98.1 F (36.7 C) (Oral)   Ht 5' 4 (1.626 m)   Wt 141 lb (64 kg)   SpO2 96%   BMI 24.20 kg/m   Physical Exam Constitutional:      Appearance: Normal appearance. She is normal weight.  HENT:     Head: Normocephalic.     Right Ear: Tympanic membrane, ear canal and external ear normal.     Left Ear: Tympanic  membrane, ear canal and external ear normal.     Nose: Nose normal.     Mouth/Throat:     Mouth: Mucous membranes are moist.     Pharynx: Oropharynx is clear.  Eyes:     Extraocular Movements: Extraocular movements intact.     Conjunctiva/sclera: Conjunctivae normal.     Pupils: Pupils are equal, round, and reactive to light.  Cardiovascular:     Rate and Rhythm: Normal rate and regular rhythm.     Pulses: Normal pulses.     Heart sounds: Normal heart sounds.  Pulmonary:     Effort: Pulmonary effort is normal.     Breath sounds: Normal breath sounds.  Abdominal:     General: Bowel sounds are normal.     Palpations: Abdomen is soft.  Musculoskeletal:        General: Normal range of motion.     Cervical back: Normal range of motion.     Lumbar back: No tenderness.  Skin:    General: Skin is warm and dry.   Neurological:     General: No focal deficit present.     Mental Status: She is alert and oriented to person, place, and time. Mental status is at baseline.  Psychiatric:        Mood and Affect: Mood normal.        Behavior: Behavior normal.        Thought Content: Thought content normal.        Judgment: Judgment normal.        Assessment & Plan:    Routine Health Maintenance and Physical Exam Immunization History  Administered Date(s) Administered   PFIZER Comirnaty(Gray Top)Covid-19 Tri-Sucrose Vaccine 04/23/2019, 05/18/2019   PFIZER SARS-COV-2 Pediatric Vaccination 5-52yrs 02/14/2020   Td 06/30/2012   Zoster Recombinant(Shingrix) 12/22/2019, 02/02/2020   Health Maintenance  Topic Date Due   Pneumococcal Vaccine: 50+ Years (1 of 1 - PCV) Never done   COVID-19 Vaccine (3 - Pfizer risk series) 03/13/2020   DTaP/Tdap/Td (2 - Tdap) 07/01/2022   Influenza Vaccine  Never done   Medicare Annual Wellness (AWV)  07/09/2024   DEXA SCAN  03/31/2025   Zoster Vaccines- Shingrix  Completed   HPV VACCINES  Aged Out   Meningococcal B Vaccine  Aged Out   Discussed health benefits of physical activity, and encouraged her to engage in regular exercise appropriate for her age and condition.  Annual physical exam  Lumbar pain  B12 deficiency -     Vitamin B12  Vitamin D  deficiency -     VITAMIN D  25 Hydroxy (Vit-D Deficiency, Fractures)  Primary hypertension -     Comprehensive metabolic panel with GFR  Hyperlipidemia, unspecified hyperlipidemia type -     Lipid panel  Other abnormal glucose -     Hemoglobin A1c  Anemia, unspecified type -     CBC with Differential/Platelet -     Iron, TIBC and Ferritin Panel  Screening for thyroid  disorder -     TSH  Fatigue, unspecified type -     CBC with Differential/Platelet -     Comprehensive metabolic panel with GFR -     Iron, TIBC and Ferritin Panel -     TSH -     VITAMIN D  25 Hydroxy (Vit-D Deficiency, Fractures) -      Vitamin B12  Pneumococcal- Denies Covid- Denies Tdap- Denies Influenza- Denies  Labs today: CBC, CMP, HgA1c, Iron, Lipid, TSH, Vit B12, Vit D  Discussed healthy eating and  exercise habits. Discussed ways to relieve intermittent back pain. Recommends heat compresses that can be applied to lower back may help relieve intermittent pain. Apply for 20 minutes at a time, 4- 6 times a day. May try Tylenol or Ibuprofen for back pain. If not improved, recommended to follow up. Follow up in 1 year- annual physical.   JoAnna Williamson, NP

## 2023-09-19 NOTE — Patient Instructions (Signed)
-  It was great to see you today.  -Physical exam completed.  -Ordered labs. Office will call with lab results and will be available via MyChart. -Recommend to use heat compresses to lower back where pain is located, 4-6 times a day, up to 20 minutes at a time.  -May try Tylenol or Ibuprofen for back pain. If not improved, follow up.  -Continue taking supplements.  -Continue healthy diet and regular exercise as tolerated. -Follow up in 1 year for a physical.

## 2023-09-20 LAB — IRON,TIBC AND FERRITIN PANEL
%SAT: 23 % (ref 16–45)
Ferritin: 191 ng/mL (ref 16–288)
Iron: 57 ug/dL (ref 45–160)
TIBC: 251 ug/dL (ref 250–450)

## 2023-10-02 DIAGNOSIS — H04123 Dry eye syndrome of bilateral lacrimal glands: Secondary | ICD-10-CM | POA: Diagnosis not present

## 2023-10-02 DIAGNOSIS — H43812 Vitreous degeneration, left eye: Secondary | ICD-10-CM | POA: Diagnosis not present

## 2023-11-17 ENCOUNTER — Ambulatory Visit: Payer: Self-pay

## 2023-11-17 NOTE — Telephone Encounter (Signed)
 FYI Only or Action Required?: FYI only for provider: appointment scheduled on 11/19/23.  Patient was last seen in primary care on 09/19/2023 by Billy Philippe SAUNDERS, NP.  Called Nurse Triage reporting Hand Pain.  Symptoms began a week ago.  Interventions attempted: Ice/heat application.  Symptoms are: gradually improving.  Triage Disposition: See PCP When Office is Open (Within 3 Days)  Patient/caregiver understands and will follow disposition?: Yes    Copied from CRM #[id number]. Topic: Clinical - Red Word Triage >> Nov 17, 2023 11:17 AM Rea ORN wrote: Red Word that prompted transfer to Nurse Triage: left hand pain, pain mostly at night for the past week. Pt also had discoloration in pinky finger that has resolved. Reason for Disposition  [1] MODERATE pain (e.g., interferes with normal activities) AND [2] present > 3 days  Answer Assessment - Initial Assessment Questions 1. ONSET: When did the pain start?     One week  2. LOCATION: Where is the pain located?     left 3. PAIN: How bad is the pain? (Scale 1-10; or mild, moderate, severe)     Moderate  4. WORK OR EXERCISE: Has there been any recent work or exercise that involved this part (i.e., hand or wrist) of the body?      5. CAUSE: What do you think is causing the pain?     unsure 6. AGGRAVATING FACTORS: What makes the pain worse? (e.g., using computer)     Worse at night  7. OTHER SYMPTOMS: Do you have any other symptoms? (e.g., fever, neck pain, numbness or tingling, rash, swelling)      Pain in knuckles, pinkie finger discolored from tip to knuckle which has now resolved.  Protocols used: Hand Pain-A-AH

## 2023-11-19 ENCOUNTER — Encounter: Payer: Self-pay | Admitting: Family Medicine

## 2023-11-19 ENCOUNTER — Ambulatory Visit (INDEPENDENT_AMBULATORY_CARE_PROVIDER_SITE_OTHER): Admitting: Family Medicine

## 2023-11-19 VITALS — BP 110/68 | HR 85 | Temp 98.1°F | Ht 64.0 in | Wt 143.0 lb

## 2023-11-19 DIAGNOSIS — M79642 Pain in left hand: Secondary | ICD-10-CM | POA: Diagnosis not present

## 2023-11-19 DIAGNOSIS — M79641 Pain in right hand: Secondary | ICD-10-CM | POA: Diagnosis not present

## 2023-11-19 DIAGNOSIS — R209 Unspecified disturbances of skin sensation: Secondary | ICD-10-CM

## 2023-11-19 DIAGNOSIS — Z862 Personal history of diseases of the blood and blood-forming organs and certain disorders involving the immune mechanism: Secondary | ICD-10-CM

## 2023-11-19 DIAGNOSIS — R252 Cramp and spasm: Secondary | ICD-10-CM

## 2023-11-19 LAB — CBC WITH DIFFERENTIAL/PLATELET
Basophils Absolute: 0 K/uL (ref 0.0–0.1)
Basophils Relative: 0.6 % (ref 0.0–3.0)
Eosinophils Absolute: 0.1 K/uL (ref 0.0–0.7)
Eosinophils Relative: 3.4 % (ref 0.0–5.0)
HCT: 35.2 % — ABNORMAL LOW (ref 36.0–46.0)
Hemoglobin: 11.7 g/dL — ABNORMAL LOW (ref 12.0–15.0)
Lymphocytes Relative: 36.6 % (ref 12.0–46.0)
Lymphs Abs: 1.2 K/uL (ref 0.7–4.0)
MCHC: 33.2 g/dL (ref 30.0–36.0)
MCV: 89.5 fl (ref 78.0–100.0)
Monocytes Absolute: 0.2 K/uL (ref 0.1–1.0)
Monocytes Relative: 6.7 % (ref 3.0–12.0)
Neutro Abs: 1.7 K/uL (ref 1.4–7.7)
Neutrophils Relative %: 52.7 % (ref 43.0–77.0)
Platelets: 239 K/uL (ref 150.0–400.0)
RBC: 3.93 Mil/uL (ref 3.87–5.11)
RDW: 13.3 % (ref 11.5–15.5)
WBC: 3.2 K/uL — ABNORMAL LOW (ref 4.0–10.5)

## 2023-11-19 LAB — COMPREHENSIVE METABOLIC PANEL WITH GFR
ALT: 9 U/L (ref 0–35)
AST: 17 U/L (ref 0–37)
Albumin: 4.2 g/dL (ref 3.5–5.2)
Alkaline Phosphatase: 62 U/L (ref 39–117)
BUN: 13 mg/dL (ref 6–23)
CO2: 34 meq/L — ABNORMAL HIGH (ref 19–32)
Calcium: 9.6 mg/dL (ref 8.4–10.5)
Chloride: 103 meq/L (ref 96–112)
Creatinine, Ser: 0.95 mg/dL (ref 0.40–1.20)
GFR: 55.57 mL/min — ABNORMAL LOW (ref >60.00)
Glucose, Bld: 86 mg/dL (ref 70–99)
Potassium: 3.6 meq/L (ref 3.5–5.1)
Sodium: 141 meq/L (ref 135–145)
Total Bilirubin: 0.5 mg/dL (ref 0.2–1.2)
Total Protein: 7.7 g/dL (ref 6.0–8.3)

## 2023-11-19 LAB — MAGNESIUM: Magnesium: 2.2 mg/dL (ref 1.5–2.5)

## 2023-11-19 LAB — SEDIMENTATION RATE: Sed Rate: 26 mm/h (ref 0–30)

## 2023-11-19 NOTE — Patient Instructions (Signed)
-  It was nice to see you today.  -Order labs for a reason for muscle cramps in fingers, to rule out any autoimmune disorder causing pain in hands, and to evaluate iron levels for a reason of cold hands with being anemic.  -Ordered x-ray of both hands to look more a structure of the hand for a reason for pain in hands.  -Recommend to take Tylenol and/or Ibuprofen for pain in hands. Also, continue to use heating pad as needed.  -Will follow up based on lab results and x-ray results.

## 2023-11-19 NOTE — Progress Notes (Signed)
 Established Patient Office Visit   Subjective:  Patient ID: Robin Zimmerman, female    DOB: 09/13/1940  Age: 83 y.o. MRN: [id number]  Chief Complaint  Patient presents with   Hand Pain    Hand pain mainly in left hand some in the right hand    HPI:  Patient is complaining of left hand pain that started all of a sudden while in church 2 weeks ago. She reports the pain is in her palm and all the joints. Constant, more pain at night time. Described as a tightness. Full ROM. Denies any injury. Reports pain radiated down to her wrist.  Also, will have some right hand pain, more in the left hand, tightness in the joints.   Complains of cold hands. Noticed discoloration in her left pinky starting 2 days after the initial pain. Discoloration resolved.  Denies numbness and tingling.  Reports her fingers will cramp when doing something.   Been using heating pads, but not taking any medication. She does not want to take medication if she does not need too.   She does have a history of anemia and had a stable low Hgb of 11.1 back in September. Denies any rectal or dark, tarry stools.  ROS See HPI above     Objective:   BP 110/68   Pulse 85   Temp 98.1 F (36.7 C) (Oral)   Ht 5' 4 (1.626 m)   Wt 143 lb (64.9 kg)   SpO2 97%   BMI 24.55 kg/m    Physical Exam Vitals reviewed.  Constitutional:      General: She is not in acute distress.    Appearance: Normal appearance. She is not ill-appearing, toxic-appearing or diaphoretic.  HENT:     Head: Normocephalic and atraumatic.  Eyes:     General:        Right eye: No discharge.        Left eye: No discharge.     Conjunctiva/sclera: Conjunctivae normal.  Cardiovascular:     Rate and Rhythm: Normal rate and regular rhythm.     Heart sounds: Normal heart sounds. No murmur heard.    No friction rub. No gallop.  Pulmonary:     Effort: Pulmonary effort is normal. No respiratory distress.     Breath sounds: Normal breath sounds.   Musculoskeletal:        General: Normal range of motion.     Right wrist: No swelling or deformity. Normal pulse (Radial).     Left wrist: No swelling or deformity. Normal pulse (Radial).     Right hand: No swelling or deformity. Normal range of motion. Normal capillary refill.     Left hand: No swelling or deformity. Normal range of motion. Normal capillary refill.  Skin:    General: Skin is warm and dry.  Neurological:     General: No focal deficit present.     Mental Status: She is alert and oriented to person, place, and time. Mental status is at baseline.  Psychiatric:        Mood and Affect: Mood normal.        Behavior: Behavior normal.        Thought Content: Thought content normal.        Judgment: Judgment normal.      Assessment & Plan:  Pain in both hands -     Comprehensive metabolic panel with GFR -     Magnesium -     Rheumatoid factor -  Sedimentation rate -     Cyclic citrul peptide antibody, IgG -     ANA -     DG Hand Complete Left; Future -     DG Hand Complete Right; Future  Muscle cramps -     Comprehensive metabolic panel with GFR -     Magnesium  Cold hands -     Iron, TIBC and Ferritin Panel -     CBC with Differential/Platelet  History of anemia -     Iron, TIBC and Ferritin Panel -     CBC with Differential/Platelet  -Order labs for a reason for muscle cramps in fingers, to rule out any autoimmune disorder causing pain in hands, and to evaluate iron levels for a reason of cold hands with being anemic.  -Ordered x-ray of both hands to look more a structure of the hand for a reason for pain in hands.  -Recommend to take Tylenol and/or Ibuprofen for pain in hands. Also, continue to use heating pad as needed.  -Will follow up based on lab results and x-ray results.   Terez Freimark, NP

## 2023-11-20 ENCOUNTER — Ambulatory Visit: Payer: Self-pay | Admitting: Family Medicine

## 2023-11-21 ENCOUNTER — Other Ambulatory Visit

## 2023-11-21 ENCOUNTER — Ambulatory Visit

## 2023-11-21 DIAGNOSIS — M19041 Primary osteoarthritis, right hand: Secondary | ICD-10-CM | POA: Diagnosis not present

## 2023-11-21 DIAGNOSIS — M79642 Pain in left hand: Secondary | ICD-10-CM

## 2023-11-21 DIAGNOSIS — M19042 Primary osteoarthritis, left hand: Secondary | ICD-10-CM | POA: Diagnosis not present

## 2023-11-21 DIAGNOSIS — M79641 Pain in right hand: Secondary | ICD-10-CM

## 2023-11-21 LAB — IRON,TIBC AND FERRITIN PANEL
%SAT: 21 % (ref 16–45)
Ferritin: 201 ng/mL (ref 16–288)
Iron: 54 ug/dL (ref 45–160)
TIBC: 260 ug/dL (ref 250–450)

## 2023-11-21 LAB — CYCLIC CITRUL PEPTIDE ANTIBODY, IGG: Cyclic Citrullin Peptide Ab: <16 U

## 2023-11-21 LAB — ANA: Anti Nuclear Antibody (ANA): NEGATIVE

## 2023-11-21 LAB — RHEUMATOID FACTOR: Rheumatoid fact SerPl-aCnc: <10 [IU]/mL (ref <14)
